# Patient Record
Sex: Male | Born: 1955 | Race: White | Hispanic: No | Marital: Married | Smoking: Current every day smoker
Health system: Southern US, Community
[De-identification: ages and names within clinical notes are randomized; demographics above are authoritative.]

## PROBLEM LIST (undated history)

## (undated) DIAGNOSIS — D696 Thrombocytopenia, unspecified: Secondary | ICD-10-CM

## (undated) DIAGNOSIS — K7460 Unspecified cirrhosis of liver: Secondary | ICD-10-CM

## (undated) DIAGNOSIS — Z9289 Personal history of other medical treatment: Secondary | ICD-10-CM

## (undated) DIAGNOSIS — D689 Coagulation defect, unspecified: Secondary | ICD-10-CM

## (undated) DIAGNOSIS — M1990 Unspecified osteoarthritis, unspecified site: Secondary | ICD-10-CM

## (undated) DIAGNOSIS — F419 Anxiety disorder, unspecified: Secondary | ICD-10-CM

## (undated) DIAGNOSIS — IMO0002 Reserved for concepts with insufficient information to code with codable children: Secondary | ICD-10-CM

## (undated) DIAGNOSIS — E512 Wernicke's encephalopathy: Secondary | ICD-10-CM

## (undated) DIAGNOSIS — D649 Anemia, unspecified: Secondary | ICD-10-CM

## (undated) DIAGNOSIS — K219 Gastro-esophageal reflux disease without esophagitis: Secondary | ICD-10-CM

## (undated) DIAGNOSIS — K859 Acute pancreatitis without necrosis or infection, unspecified: Secondary | ICD-10-CM

## (undated) DIAGNOSIS — E119 Type 2 diabetes mellitus without complications: Secondary | ICD-10-CM

## (undated) DIAGNOSIS — F1910 Other psychoactive substance abuse, uncomplicated: Secondary | ICD-10-CM

## (undated) DIAGNOSIS — F1011 Alcohol abuse, in remission: Secondary | ICD-10-CM

## (undated) HISTORY — PX: COLONOSCOPY: SHX174

## (undated) HISTORY — DX: Unspecified cirrhosis of liver: K74.60

## (undated) HISTORY — DX: Wernicke's encephalopathy: E51.2

## (undated) HISTORY — PX: NECK SURGERY: SHX720

## (undated) HISTORY — PX: ESOPHAGOGASTRODUODENOSCOPY: SHX1529

---

## 2003-12-17 ENCOUNTER — Ambulatory Visit (HOSPITAL_COMMUNITY): Admission: RE | Admit: 2003-12-17 | Discharge: 2003-12-17 | Payer: Self-pay | Admitting: Family Medicine

## 2005-12-16 ENCOUNTER — Ambulatory Visit (HOSPITAL_COMMUNITY): Admission: RE | Admit: 2005-12-16 | Discharge: 2005-12-16 | Payer: Self-pay | Admitting: Neurosurgery

## 2007-05-30 ENCOUNTER — Ambulatory Visit: Payer: Self-pay | Admitting: Gastroenterology

## 2007-05-31 ENCOUNTER — Ambulatory Visit (HOSPITAL_COMMUNITY): Admission: RE | Admit: 2007-05-31 | Discharge: 2007-05-31 | Payer: Self-pay | Admitting: Gastroenterology

## 2007-06-01 ENCOUNTER — Ambulatory Visit (HOSPITAL_COMMUNITY): Admission: RE | Admit: 2007-06-01 | Discharge: 2007-06-01 | Payer: Self-pay | Admitting: Internal Medicine

## 2007-06-09 ENCOUNTER — Inpatient Hospital Stay (HOSPITAL_COMMUNITY): Admission: EM | Admit: 2007-06-09 | Discharge: 2007-06-10 | Payer: Self-pay | Admitting: Emergency Medicine

## 2007-06-14 ENCOUNTER — Ambulatory Visit: Payer: Self-pay | Admitting: Gastroenterology

## 2007-06-14 ENCOUNTER — Emergency Department (HOSPITAL_COMMUNITY): Admission: EM | Admit: 2007-06-14 | Discharge: 2007-06-14 | Payer: Self-pay | Admitting: Emergency Medicine

## 2007-06-15 ENCOUNTER — Ambulatory Visit: Payer: Self-pay | Admitting: Gastroenterology

## 2007-06-15 ENCOUNTER — Encounter: Payer: Self-pay | Admitting: Gastroenterology

## 2007-06-15 ENCOUNTER — Ambulatory Visit (HOSPITAL_COMMUNITY): Admission: RE | Admit: 2007-06-15 | Discharge: 2007-06-15 | Payer: Self-pay | Admitting: Gastroenterology

## 2007-06-20 ENCOUNTER — Ambulatory Visit (HOSPITAL_COMMUNITY): Admission: RE | Admit: 2007-06-20 | Discharge: 2007-06-20 | Payer: Self-pay | Admitting: Gastroenterology

## 2007-06-21 ENCOUNTER — Ambulatory Visit: Payer: Self-pay | Admitting: Gastroenterology

## 2007-07-31 ENCOUNTER — Ambulatory Visit: Payer: Self-pay | Admitting: Gastroenterology

## 2007-12-03 ENCOUNTER — Emergency Department (HOSPITAL_COMMUNITY): Admission: EM | Admit: 2007-12-03 | Discharge: 2007-12-03 | Payer: Self-pay | Admitting: Emergency Medicine

## 2008-04-12 DIAGNOSIS — D539 Nutritional anemia, unspecified: Secondary | ICD-10-CM | POA: Insufficient documentation

## 2008-04-12 DIAGNOSIS — R042 Hemoptysis: Secondary | ICD-10-CM | POA: Insufficient documentation

## 2008-04-12 DIAGNOSIS — R112 Nausea with vomiting, unspecified: Secondary | ICD-10-CM

## 2008-04-12 DIAGNOSIS — Z8719 Personal history of other diseases of the digestive system: Secondary | ICD-10-CM

## 2008-04-12 DIAGNOSIS — R5381 Other malaise: Secondary | ICD-10-CM

## 2008-04-12 DIAGNOSIS — D649 Anemia, unspecified: Secondary | ICD-10-CM

## 2008-04-12 DIAGNOSIS — R1013 Epigastric pain: Secondary | ICD-10-CM

## 2008-04-12 DIAGNOSIS — R5383 Other fatigue: Secondary | ICD-10-CM

## 2008-04-12 DIAGNOSIS — K921 Melena: Secondary | ICD-10-CM

## 2008-04-12 DIAGNOSIS — F1010 Alcohol abuse, uncomplicated: Secondary | ICD-10-CM

## 2008-04-12 DIAGNOSIS — K219 Gastro-esophageal reflux disease without esophagitis: Secondary | ICD-10-CM

## 2008-04-12 DIAGNOSIS — K859 Acute pancreatitis without necrosis or infection, unspecified: Secondary | ICD-10-CM

## 2010-07-28 NOTE — Consult Note (Signed)
Jared, Glover             ACCOUNT NO.:  [id number]   MEDICAL RECORD NO.:  [id number]          PATIENT TYPE:  AMB   LOCATION:  DAY                           FACILITY:  [facility]   PHYSICIAN:  Kassie Mends, M.D.      DATE OF BIRTH:  1955/04/28   DATE OF CONSULTATION:  05/30/2007  DATE OF DISCHARGE:                                 CONSULTATION   CHIEF COMPLAINT:  Epigastric pain for 1 month.   HISTORY OF PRESENT ILLNESS:  [name] is a 55 year old male.  He  has a 56-month history of epigastric pressure.  He notes it is hard to  belch.  He describes the epigastrium being tender to the touch.  He  complains of symptoms all day long.  He denies any radiation of his pain  to shoulders or to his back.  He has tried Gas-X and Tums which may help  a little but not completely.  Complains of nausea.  Has had some  vomiting.  He has episodes where he feels shaky and dizzy.  He has been  seen by Dr. Raquel James, his primary care physician.  He was found to have  a mild anemia with hemoglobin of 11.8 and hematocrit 33.9, normal MCV.  He has also found to have transaminitis with an alkaline phosphatase of  213, AST 207 and ALT 107.  He denies any known history of hepatitis  denies any chest pain, dysphagia, odynophagia.  He does have  regurgitation within a few minutes after eating his meals.  He does  complain of anorexia and has lost about 5 pounds in the last month or  so.  He has noticed bright red blood as well as dark blood in his bowel  movements as well as on the toilet paper in the commode on several  occasions over the last month.  He is having anywhere from 1 to 5 semi-  formed bowel movements a day.  He has also noted clots in his stool.   He has had a CMP which showed a calcium of 8.3 and albumin of 3.3 and  LFTs as noted above.  He did have a normal total bilirubin, normal  sodium and potassium as well as creatinine, CBC showed white blood cell  count of 4.  He had hepatitis  markers drawn, but they were unable to be  analyzed.  Therefore these are going to need to be repeated.   PAST MEDICAL AND SURGICAL HISTORY:  He has had cervical disk surgery.   CURRENT MEDICATIONS:  Gas X p.r.n., Tums p.r.n.   ALLERGIES:  ASPIRIN causes upset stomach.   FAMILY HISTORY:  There is no known family history of carcinoma or  chronic GI problems.  Mother deceased 73 lung cancer.  Father deceased  from 77 to MI.  He has four healthy siblings.   SOCIAL HISTORY:  [name] is married.  He has one healthy daughter  one son and one daughter.  He is employed in [employer] previously but  was laid off about 2 months ago.  He has a 45 plus pack year history of  tobacco use.  He consumes about a 12-pack of beer a day and has for the  last 30 years he denies any drug use.   REVIEW OF SYSTEMS:  See HPI otherwise negative.   PHYSICAL:  VITAL SIGNS:  Weight 178 pounds, height 67 inches.  Temperature 98.5, blood pressure 104/70 and pulse 80.  GENERAL:  He is a well-developed well-nourished Caucasian male in no  acute distress.  HEENT:  Pupils equal, round, and reactive to light.  Sclerae clear, nonicteric.  Conjunctivae pink.  Oropharynx pink and  moist without lesions.NECK:  Supple without evidence of mass or  thyromegaly.  CHEST:  Heart regular rate and rhythm.  Normal S1, S2  without murmurs, clicks, rubs, or gallops.  LUNGS:  Clear to  auscultation bilaterally.  ABDOMEN:  Protuberant with positive bowel  sounds x4.  No bruits auscultated.  Epigastrium is quite tender to the  touch.  He is tender to the right upper quadrant as well.  He does have  firm tender hepatomegaly.  Liver is palpable 4 finger-breadths below the  right costal margin.  Unable to palpate splenomegaly.  Exam is limited  given the patient's body habitus.  EXTREMITIES:  Without clubbing or edema bilaterally.  SKIN:  Pink warm  and dry without any rash or jaundice.   LABORATORY STUDIES:  See HPI.    IMPRESSION:  [name] is a 55 year old Caucasian male with history  of significant alcohol abuse who presents with multiple concerns today  with the first being and epigastric right upper quadrant pressure that  has been persistent over the last month along with some weight loss and  nausea, heartburn and indigestion, and anorexia, and regurgitation.  Some of his symptoms are typical GERD symptoms.  However, they have  presented all of a sudden. He also has significant transaminitis which  could very well be related to alcohol abuse and cholestasis.   Of concern as well as is his large volume rectal bleeding which is going  to need to be further evaluated to rule out diverticular bleeding,  colopathy, or colorectal carcinoma.  Of course, he is going to need  ultrasound to look for cirrhosis as well given his thrombocytopenia and  history of alcohol abuse.   PLAN:  1. He is to abstain from alcohol.  However, he admits that he is not      ready to do this at this time.  He denies a prescription for      Librium for control.  2. He is going to need HCV antibody and hepatitis B surface antigen.  3. He is going to need an abdominal ultrasound this is possible.  4. He is going to need a colonoscopy and EGD with Dr. Cira Servant in the OR      with the use of propanol given his history of alcohol abuse      history.  I discussed this with her and both agree with this plan.      I have discussed risks and benefits, which include infection,      perforation, drug reaction.  Consent will be obtained.  5. He is to begin omeprazole 20 mg daily #31 with one refill.  Thank      you, Dr. Raquel James, for allowing [place] to participate in the care of      Mr. Eckersley.      Lorenza Burton, N.P.      Kassie Mends, M.D.  Electronically Signed    KJ/MEDQ  D:  05/30/2007  T:  05/30/2007  Job:  [id number]   cc:   Ursula Beath, MD  Fax: [phone]

## 2010-07-28 NOTE — Assessment & Plan Note (Signed)
NAMECARVEL, HUSKINS                CHART#:  [id number]   DATE:  06/14/2007                       DOB:  [birth year]       Lorenza Burton, N.P.  Electronically Signed     KJ/MEDQ  D:  06/14/2007  T:  06/14/2007  Job:  [id number]

## 2010-07-28 NOTE — Op Note (Signed)
NAMEGIANFRANCO, [name]             ACCOUNT NO.:  [id number]   MEDICAL RECORD NO.:  [id number]          PATIENT TYPE:  END   LOCATION:  DAY                           FACILITY:  [facility]   PHYSICIAN:  Kassie Mends, M.D.      DATE OF BIRTH:  04-10-1955   DATE OF PROCEDURE:  06/15/2007  DATE OF DISCHARGE:                               OPERATIVE REPORT   REFERRING PHYSICIAN:  Ursula Beath, MD.   PROCEDURE:  1. Colonoscopy with cold forceps polypectomy.  2. Esophagogastroduodenoscopy with cold forceps biopsy.   INDICATION FOR EXAM:  [name] is a 55 year old male who presented  as an outpatient with abdominal pain.  He was found to have pancreatitis  secondary to alcohol abuse.  He presented to the emergency department on  Friday with black tarry stools.  His hemoglobin was 7.5 and he was  transfused 4 units of packed red blood cells and sent home.  He has not  had any subsequent melena.  His hemoglobin has been stable.  He  continues to complain of abdominal pain.  His last measured lipase was  mildly elevated at 97 on June 14, 2007.  He is maintained as an  outpatient on Ultram.   FINDINGS:  1. Normal distal terminal ileum approximately 5 cm visualized.  No old      blood seen in the terminal ileum, or fresh blood.  2. No old or fresh blood seen in the colon.  A 3-mm sessile cecal      polyp removed via cold forceps.  A 5-mm sessile sigmoid colon polyp      removed via cold forceps.  Pancolonic diverticulosis.  Otherwise no      masses, inflammatory changes or AVMs seen.  3. Normal retroflexed view of the rectum.  4. Normal esophagus without evidence of Barrett's, mass, erosion,      ulceration or stricture.  No varices.  5. Snake skinning seen in the proximal stomach which extended into the      antrum.  Prominent proximal gastric folds.  No erosion or      ulceration.  No gastric varices.  Biopsies obtained via cold      forceps to evaluate for H. pylori gastritis.  6.  Frequent erythema, patchy erythema and the duodenal bulb extending      into the junction of D1 and D2.  No ulcerations.  No varices.      Normal second portion of the duodenum.  Copious amounts of bile      seen in the lumen.  The ampulla was not visualized.   DIAGNOSIS:  1. No source for anemia and melena identified.  2. Abdominal pain is likely multifactorial to include resolving      alcoholic pancreatitis and gastritis.   RECOMMENDATIONS:  1. Continue Ultram as needed for pain.  2. Avoid alcohol.  Avoid gastric irritants.  He was given a handout on      gastric irritants and gastritis.  3. He should have a capsule endoscopy to complete evaluation of his      gastrointestinal tract due to his history of  melena and hemoglobin      of 7.5.  4. He should continue his omeprazole.  He should avoid aspirin and      nonsteroidal anti-inflammatory drugs for 30 days.  No      anticoagulation for 5 days.  5. Will call him with the results of his biopsies of his colon.  If      his polyp is not an advanced adenoma, then he may have a screening      colonoscopy in 10 years, according to the 2008 ACG guidelines.  6. He should follow a high-fiber diet.  He was given a handout on high-      fiber diet, polyps and diverticulosis.   MEDICATIONS:  Propofol provided by anesthesia.   PROCEDURE TECHNIQUE:  Physical exam was performed.  Informed consent was  obtained from the patient that explained the benefits, risks and  alternatives to the procedure.  The patient was connected to the monitor  and placed in the left lateral position.  Continuous oxygen was provided  by nasal cannula and IV medicine administered through an indwelling  cannula.  After administration of sedation and rectal exam of the  patient's rectum was made and the scope was advanced under direct  visualization to the distal terminal ileum.  The scope was removed  slowly by carefully examining the color, texture, anatomy and  integrity  of the mucosa on the way out.   After the colonoscopy, the patient's esophagus was intubated with the  diagnostic gastroscope and the scope was advanced under direct  visualization to the second portion of duodenum.  The scope was removed  slowly by carefully examining the color, texture, anatomy and integrity  of the mucosa on the way out.  The patient was recovered in endoscopy  and discharged home in satisfactory condition.      Kassie Mends, M.D.  Electronically Signed     SM/MEDQ  D:  06/15/2007  T:  06/15/2007  Job:  [id number]   cc:   Ursula Beath, MD  Fax: [phone]

## 2010-07-28 NOTE — H&P (Signed)
[name], [name]             ACCOUNT NO.:  [id number]   MEDICAL RECORD NO.:  [id number]          PATIENT TYPE:  INP   LOCATION:  A304                          FACILITY:  [facility]   PHYSICIAN:  Gardiner Barefoot, MD    DATE OF BIRTH:  11-16-1955   DATE OF ADMISSION:  06/09/2007  DATE OF DISCHARGE:  LH                              HISTORY & PHYSICAL   PRIMARY CARE PHYSICIAN:  Dr. Raquel James.   CHIEF COMPLAINT:  Fatigue.   HISTORY OF PRESENT ILLNESS:  This is a 55 year old male with a history  of alcoholism with daily alcohol greater than 30 years, with last drink  March 17, when he reports he quit, and recent history of melena and  evaluation by gastroenterology, Dr. Cira Servant, for the melena here with  symptoms of fatigue for the last week or so.  The patient was actually  scheduled to have an EGD and colonoscopy this past week; however, due to  ongoing pancreatitis the colonoscopy was postponed.  The patient has had  a recent ultrasound, which shows steatosis likely secondary to his  alcohol and has had recent bouts of pancreatitis, also will likely  secondary to his alcohol use.  The patient, however, has completed a  course of Librium and quit drinking without any difficulty.  He presents  here again with some confusion and fatigue and just not feeling well  overall.  He denies any hematemesis or any vomiting and no blood from  below.  He does report the last melanotic stool was somewhere more than  4 days ago, although he is not sure exactly.  He, though, says that it  is very infrequent.  Otherwise, the patient has no other complaints.   PAST MEDICAL HISTORY:  1. Hepatosteatosis.  2. Alcohol abuse.   MEDICATIONS:  1. He recently finished a course of Librium for withdrawal.  2. Omeprazole.  3. Ultram.   ALLERGIES:  No known drug allergies.   SOCIAL HISTORY:  He quit tobacco or year ago, quit alcohol approximately  10 days ago.   FAMILY HISTORY:  No known history of any GI  disease.   REVIEW OF SYSTEMS:  Negative except as per history of present illness.   PHYSICAL EXAM:  VITALS:  Temperature is 99.4, pulse 102, respirations  20, blood pressure is 104/55, O2 saturation is 97% on room air.  GENERAL:  The patient is awake, alert and oriented x3, appears in no  acute distress.  No confusion at this time.  HEENT:  Anicteric.  CARDIOVASCULAR:  Tachycardic with a regular rhythm, with no murmurs,  rubs or gallops.  LUNGS:  Clear to auscultation bilaterally.  ABDOMEN:  Soft, nontender, nondistended.  Positive bowel sounds, no  hepatosplenomegaly, obese.  RECTAL:  No active bleeding.  No lesions.  No hemorrhoids exterior.  The  emergency room reports that the patient is heme-negative.  EXTREMITIES:  No cyanosis, clubbing or edema.   Hemoglobin is 7.0, platelets 292, WBC of 8.8.  Sodium 133, potassium  3.6, chloride 95, bicarb 31, BUN 5, creatinine is 1.06 and glucose 122.   ASSESSMENT/PLAN:  Multifactorial anemia.  Likely this is a combination  due to a mild gastrointestinal bleed, for which he is being evaluated by  gastroenterology and will benefit from a colonoscopy as an outpatient.  Also with an elevated MCV, he likely has some element to vitamin  deficiency, particular light of also his low albumin.  This also could  represent cirrhosis although the platelets are normal.  Additionally,  with the normal platelets it does not appear that the bleed is very  active at this time.  We will admit the patient for observation, give  him 4 units of packed red blood cells and the patient will be discharged  after the completion of the blood.  However, the patient was instructed  certainly to seek any medical attention if he does again start to bleed  from below  and to come to the nearest emergency room or call 9-1-1,  though at this time nothing is active and there is no indication for any  emergent intervention.  The patient's last hemoglobin in the medical   record here was greater than a year and a half ago, so it would not be  difficult to correlate with it now.  The patient and the patient's wife  were both comfortable with the plan and if there is any active bleeding,  the patient could certainly be transferred to a higher level of care and  have gastroenterology involved.      Gardiner Barefoot, MD  Electronically Signed     RWC/MEDQ  D:  06/09/2007  T:  06/10/2007  Job:  [id number]

## 2010-07-28 NOTE — H&P (Signed)
[name], [name]             ACCOUNT NO.:  [id number]   MEDICAL RECORD NO.:  [id number]          PATIENT TYPE:  AMB   LOCATION:  DAY                           FACILITY:  [facility]   PHYSICIAN:  Kassie Mends, M.D.      DATE OF BIRTH:  1956-01-25   DATE OF ADMISSION:  DATE OF DISCHARGE:  LH                              HISTORY & PHYSICAL   HISTORY OF PRESENT ILLNESS:  [name] is a 55 year old male with a  history of alcohol abuse, who quit drinking May 30, 2007, after an  office visit with [place].  He had significant epigastric pain at that time.  He also had nausea and vomiting.  He was having hematochezia.  He  noticed bright red blood in his stool at that time.  He was scheduled  for a colonoscopy and EGD under propofol in the OR with Dr. Cira Servant;  however, he called back in with severe epigastric pain.  He was sent  immediately to the hospital for a CT scan on June 01, 2007.  He was  found to have acute pancreatitis.  He was started on Librium for alcohol  withdrawal at that time and given Ultram for pain control.  He was  conservatively managed outpatient until June 09, 2007, when he  developed profound fatigue and weakness and confusion.  He presented to  the emergency room.  He was found to have a significant anemia.  His  hemoglobin had actually dropped from 11.8 down to 7.  He was given a 4-  unit transfusion and was felt to be stable and was sent home.  Due to  the fact that there was no gastroenterology coverage, he was told to  follow up with [place] this week.  He did have a CBC yesterday, which showed  a hemoglobin of 11.7, hematocrit of 33.8, a platelet count of 306 and a  white blood cell count of 7.6.  He presents today for follow-up prior to  his EGD and colonoscopy under Propofol tomorrow.  He tells me that he  has had continued weakness and fatigue and has been having some  coughing.  He complains of pain throughout his entire abdomen.  He has  had coughing and  gagging and actually blood in his sputum.  He denies  any further alcohol use since May 30, 2007.  He denies any nausea or  vomiting.  He does note that his pain radiates around to his left flank.  His abdominal pain is 8 or 9/10 on pain scale.  It is definitely worse  with inspiration.  He denies any significant shortness of breath.  He  has not had a bowel movement nor has he had any further melena since  hospitalization June 09, 2007.   CURRENT MEDICATIONS:  Omeprazole 20 mg daily.   ALLERGIES:  ASPIRIN causes upset stomach.   OBJECTIVE:  VITAL SIGNS:  Weight 174 pounds, height 67 inches,  temperature 98.5, blood pressure 90/68, pulse 64.  GENERAL:  [name] is an ill-appearing Caucasian male who is alert,  oriented, pleasant and cooperative.  He does appear quite  fatigued.  He  is accompanied by his wife.  HEENT:  Sclerae clear, nonicteric.  Conjunctivae pale.  Oropharynx pink  and moist without any lesions.  NECK:  Supple without any mass or thyromegaly.  CHEST:  Heart regular rate and rhythm without any murmurs, clicks, rubs  or gallops.  LUNGS:  With decreased breath sounds bilaterally.  ABDOMEN:  Protuberant with positive bowel sounds x4.  No bruits  auscultated.  Abdomen is soft, mildly tender to the epigastrium and  right upper quadrant and right flank.  The liver is just below the right  costal margin.  EXTREMITIES:  With trace lower extremity edema bilaterally.   IMPRESSION:  [name] is a 55 year old male with a history of  alcohol abuse, recent alcoholic pancreatitis, recent gastrointestinal  bleed with melena which required a 4-unit transfusion, as well as  profound fatigue, weakness, low-grade fever, and now hemoptysis.  I have  discussed this case with Dr. Cira Servant as well as Dr. Elige Radon, the  hospitalist.  He is scheduled to undergo colonoscopy and EGD under  Propofol by Dr. Cira Servant tomorrow to further evaluate his hematochezia and  melena and rule out  diverticular bleeding, colorectal carcinoma, peptic  ulcer disease, esophageal varices.  INR was 1.0 on May 31, 2007, and  he currently has a normal platelet count but does have some  hypoalbuminemia.  We also need to rule out a concomitant pneumonia given  the low-grade fevers and now hemoptysis.  Perforated peptic ulcer  disease should remain in the differential as well given gastrointestinal  bleed, anemia and pancreatitis.   PLAN:  1. I have discussed this case with Dr. Dorris Singh, [facility] hospitalist, and she recommends immediate evaluation in      the emergency room with probable hospital admission.  2. Dr. Cira Servant and Tana Coast, P.A.-C., are aware and will further be      involved in his care to determine whether he is going to be able to      proceed with colonoscopy and EGD set up in the OR for tomorrow with      Dr. Kassie Mends.  3. He should remain on omeprazole daily.  4. Further recommendations to follow.   ADDENDUM:  Patient seen and evaluated in ED. Missed his preop  appointment. Labs unchanged and CXR clear. D/C to home. Pt will prep for  procedure. Called in Ultram for pain.      [name], N.P.      Kassie Mends, M.D.  Electronically Signed    KJ/MEDQ  D:  06/14/2007  T:  06/14/2007  Job:  [id number]   cc:   Ursula Beath, MD  Fax: [phone]

## 2010-07-28 NOTE — Assessment & Plan Note (Signed)
[name], [name]                CHART#:  [id number]   DATE:  07/31/2007                       DOB:  05-12-1955   REFERRING PHYSICIAN:  Ursula Beath, M.D., [facility].   PROBLEM LIST:  1. Alcohol abuse, last drink 2-3 weeks ago.  2. Melena with a gastrointestinal workup to include an      esophagogastroduodenoscopy, colonoscopy, and capsule endoscopy.   SUBJECTIVE:  [name] is a 55 year old male who presented with  alcoholic pancreatitis and hepatitis.  He reports that he has not  really had any alcohol to drink except for 3 beers approximately 2-3  weeks ago.  He reports doing okay.  He says the antidepressants that  Dr. Raquel James put him on have been doing very well.  He is not sure of  the name.   MEDICATIONS:  Antidepressant   OBJECTIVE:  VITAL SIGNS:  Weight 201 pounds (up 20 pounds since March of  2009), height 5 feet 7 inches, BMI 31.5 (obese), temperature 98, blood  pressure 124/80, pulse 84.  GENERAL:  He is in no apparent distress.  Alert and oriented x4.  LUNGS:  Clear to auscultation bilaterally.  CARDIOVASCULAR:  Regular rhythm. ABDOMEN:  Bowel sounds are present.  Soft, nontender, nondistended, slightly obese.   ASSESSMENT:  [name] is a 55 year old male who was last seen in  April 2009 for his upper endoscopy and colonoscopy.  At that time, he  was known to have a persistently elevated lipase.  A CT scan in March of  2009 showed fullness in the head of his pancreas.  He reports that Dr.  Raquel James checked his labs, and they are all normal now.  His CT scan was  never performed.  He has had alcohol to drink once since May 30, 2007,  which is likley contributing to his persistently elevated lipase.  Thank  you for allowing me to see [name] in consultation.  My  recommendations follow.   RECOMMENDATIONS:  1. Will obtain labs from Dr. Debarah Crape office.  If a lipase has not      been checked, he will need a repeat  lipase, and if it remains      elevated, he needs a repeat CT scan of the abdomen with IV      contrast, pancreatic protocol.  2. Screening colonoscopy in 2019.  3. He can use the omeprazole as needed for any intermittent abdominal      pain.  4. Again, I encouraged him to completely avoid alcohol. I discussed      the complication of      chronic pancreatitis, its symptoms and its management.  5. Return visit in 6 months.       Kassie Mends, M.D.  Electronically Signed     SM/MEDQ  D:  07/31/2007  T:  07/31/2007  Job:  [id number]   cc:   Ursula Beath, MD

## 2010-07-28 NOTE — Discharge Summary (Signed)
[name], [name]             ACCOUNT NO.:  [id number]   MEDICAL RECORD NO.:  [id number]          PATIENT TYPE:  INP   LOCATION:  A304                          FACILITY:  [facility]   PHYSICIAN:  Gardiner Barefoot, MD    DATE OF BIRTH:  1955-06-24   DATE OF ADMISSION:  06/09/2007  DATE OF DISCHARGE:  03/28/2009LH                               DISCHARGE SUMMARY   PRIMARY CARE PHYSICIAN:  Ursula Beath, MD   DISCHARGE DIAGNOSES:  1. Anemia, multifactorial.  2. Alcohol abuse, status post self-rehabilitation.  3. Pancreatitis.   HISTORY OF PRESENT ILLNESS:  Please see dictated history and physical  from the day prior.  This is a 55 year old male who did come in with a  complaint of some confusion for about 10 days which correlated with when  he had stopped drinking alcohol on May 30, 2007.  He reported that he  just had been fatigued and did not feel his normal self.  The patient  was prescribed and took Librium to avoid any withdrawal symptoms and has  had no alcohol since May 30, 2007, at all.  He otherwise has felt  better, he initially had some pain and nausea, but now just did not felt  himself.  On evaluation, the patient was noted to be anemic with a  hemoglobin of 7.0.  Of note, the patient does report he is being  followed by Dr. Cira Servant and was initially to have colonoscopy and EGD;  however, was unable to do due to an acute exacerbation of pancreatitis.  This is being done because of intermittent melena.  The patient does  report last episode of melena was about 4 days prior to this  presentation and has had not any active bleeding since that time.   HOSPITAL COURSE:  1. Anemia.  The patient does have an elevated MCV and his platelets      were normal, this does suggest some nutritional etiology of his      anemia.  B12 and folate level have been sent off and the patient      was given a banana bag during his hospitalization.  He was also      transfused with 4 units  of packed red blood cells for symptomatic      anemia.  He is to have a followup CBC from his primary care      physician who he will contact on Monday.  He also is to contact Dr.      Cira Servant from gastroenterology to assure that he is getting followup      with gastroenterology.  The patient and wife were comfortable with      that plan.  Since there is no active bleed at this time and only      intermittent bouts of melena, there is no indication for any      intervention.  However, the patient was instructed strictly to      certainly return to the emergency room or seek medical care via 911      if he does have any episode of hematemesis, melena,  or bright red      blood per rectum, or any other concerns.  Again, the patient and      wife were comfortable with this plan.  There is a previous      hemoglobin which was closer to normal limits; however, it was about      a year and a half ago.  It does suggest more of a slow bleed;      however, it has been hard to correlate with that data.  However,      again with no active bleed.  There is no intervention appropriate      at this time.  2. Pancreatitis.  The patient did have a recent pancreatitis; however,      is not complaining of abdominal at this time, although he did have      a little nausea, this has resolved.  His laboratory values are      normal.  3. Alcohol abuse.  The patient has been off alcohol greater than 10      days and has essentially passed with the withdrawal.  No other      interventions at this time.  4. GERD.  The patient was continued on omeprazole and will continue      that at home.      Gardiner Barefoot, MD  Electronically Signed     RWC/MEDQ  D:  06/10/2007  T:  06/10/2007  Job:  [id number]

## 2010-07-31 NOTE — Op Note (Signed)
[name], WOODRICK             ACCOUNT NO.:  [id number]   MEDICAL RECORD NO.:  [id number]          PATIENT TYPE:  AMB   LOCATION:  DAY                           FACILITY:  [facility]   PHYSICIAN:  Kassie Mends, M.D.      DATE OF BIRTH:  07/24/1955   DATE OF PROCEDURE:  DATE OF DISCHARGE:  06/20/2007                                PROCEDURE NOTE   PROCEDURE:  Givens capsule study.   INDICATION FOR EXAM:  Mr. Staton is a 55 year old male who presented  to the emergency department with black tarry stools.  His hemoglobin was  found to be 7.  He received 4 units of packed red blood cells and his  hemoglobin increased to 11.1.  He had a colonoscopy and an  esophagogastroduodenoscopy in April 2009, which showed no source for  black tarry stools.  He had a cecal and a sigmoid colon polyp, which  were adenomatous and biopsies of his gastric mucosa showed chronic  gastritis without evidence of Helicobacter pylori.  The givens capsule  study is being performed due to obscure overt GI bleed.   PROCEDURE DATA:  Weight 178 pounds, build normal, gastric passage time,  1 minute.  Small bowel passage time, 5 hours and 20 minutes.  No old  blood or fresh blood was seen in the small intestines with occasional  views obscured by retained intestinal content.  No ulcers, masses, or  AVM's were seen.   RECOMMENDATIONS:  No source for melena identified.  He should continue  to avoid alcohol.  He should continue his omeprazole.  Return visit with  Dr. Cira Servant in 2 months.      Kassie Mends, M.D.  Electronically Signed     SM/MEDQ  D:  06/22/2007  T:  06/23/2007  Job:  [id number]   cc:   [facility] [place]  Fax: [phone]   Ursula Beath, MD  Fax: [phone]

## 2010-07-31 NOTE — Op Note (Signed)
[name], [name]             ACCOUNT NO.:  [id number]   MEDICAL RECORD NO.:  [id number]          PATIENT TYPE:  AMB   LOCATION:  SDS                          FACILITY:  [facility]   PHYSICIAN:  Reinaldo Meeker, M.D. DATE OF BIRTH:  Mar 25, 1955   DATE OF PROCEDURE:  12/16/2005  DATE OF DISCHARGE:                                 OPERATIVE REPORT   PREOPERATIVE DIAGNOSIS:  Herniated disk at C6-7.   POSTOPERATIVE DIAGNOSIS:  Herniated disk at C6-7.   PROCEDURE:  C6-7 anterior cervical diskectomy with bone bank fusion followed  by Venture anterior cervical plating.   SURGEON:  Dr. Gerlene Fee.   ASSISTANT:  Marikay Alar.   DESCRIPTION OF PROCEDURE:  After being placed in the supine position in 5  pounds halter traction, the patient's neck was prepped and draped in the  usual sterile fashion.  Localizing fluoroscopy was used prior to incision to  identify the appropriate level.  A transverse incision was made in the right  anterior neck, started at the midline headed towards the medial aspect of  the sternocleidomastoid muscle.  The platysma muscle was then incised  transversely.  The natural fascial plane between the strap muscles medially  and the sternocleidomastoid laterally was identified and followed down to  the anterior aspect of the cervical spine.  The longus colli muscles were  identified, split in the midline stripped away bilaterally with the Chief Operating Officer.  A self retaining retractor was placed for  exposure and x-rays showed approach to the appropriate level.  Using a 15  blade, the annulus of the disk was incised.  Using pituitary rongeurs and  curettes, approximately 90% of the disk material was removed. A high-speed  drill was used to widen the interspace. The microscope was draped, brought  in the field and used the remainder of the case.  Using microsection  technique, the remainder of the disk material along the posterior  longitudinal ligament  was removed. The ligament was then incised  transversely and the cut edges removed with a Kerrison punch.  Herniated  disk material was removed bilaterally particularly towards the right  symptomatic side and the C7 nerve roots were well decompressed bilaterally.  At this time, inspection was carried out in all directions for any evidence  of residual compression and none could be identified.  Large amounts of  irrigation were carried out and any bleeding was controlled with bipolar  coagulation and Gelfoam.  Measurements were taken and an 8 mm bone bank plug  was reconstituted.  After irrigating once more to confirm hemostasis, a plug  was impacted without difficulty and fluoroscopy showed it to be in good  position.  An appropriate length transverse cervical plate was then chosen.  Under fluoroscopic guidance, drill holes were placed followed by placement  of 13 mm screws x4.  The locking mechanism was secured at all 4 locations  and final fluoroscopy showed the plate, screws and plug to all be in good  position.  At this time, large amounts of irrigation were carried out and  any bleeding controlled with bipolar  coagulation.  The wound was then closed using interrupted Vicryl on the  platysmas muscle and inverted 5-0 PDS on the subcuticular layer and Steri-  Strips on the skin.  A sterile dressing and soft collar then applied and the  patient was extubated, taken to the recovery room in stable condition.           ______________________________  Reinaldo Meeker, M.D.     ROK/MEDQ  D:  12/16/2005  T:  12/17/2005  Job:  [id number]

## 2010-12-07 LAB — CBC
HCT: 20.4 — ABNORMAL LOW
Hemoglobin: 7 — CL
MCHC: 34.1
MCV: 102.6 — ABNORMAL HIGH
Platelets: 292
RBC: 1.99 — ABNORMAL LOW
RDW: 17.6 — ABNORMAL HIGH
WBC: 8.8

## 2010-12-07 LAB — COMPREHENSIVE METABOLIC PANEL
ALT: 24
AST: 41 — ABNORMAL HIGH
Albumin: 2 — ABNORMAL LOW
Alkaline Phosphatase: 74
BUN: 5 — ABNORMAL LOW
CO2: 31
Calcium: 8.5
Chloride: 95 — ABNORMAL LOW
Creatinine, Ser: 1.06
GFR calc Af Amer: >60
GFR calc non Af Amer: >60
Glucose, Bld: 122 — ABNORMAL HIGH
Potassium: 3.6
Sodium: 133 — ABNORMAL LOW
Total Bilirubin: 0.8
Total Protein: 5.6 — ABNORMAL LOW

## 2010-12-07 LAB — CROSSMATCH
ABO/RH(D): A POS
Antibody Screen: NEGATIVE

## 2010-12-07 LAB — DIFFERENTIAL
Basophils Absolute: 0
Basophils Relative: 0
Eosinophils Absolute: 0.1
Eosinophils Relative: 1
Lymphocytes Relative: 11 — ABNORMAL LOW
Lymphs Abs: 0.9
Monocytes Absolute: 0.6
Monocytes Relative: 7
Neutro Abs: 7.1
Neutrophils Relative %: 81 — ABNORMAL HIGH

## 2010-12-07 LAB — FOLATE: Folate: 11.1

## 2010-12-07 LAB — AMMONIA: Ammonia: 8 — ABNORMAL LOW

## 2010-12-07 LAB — LIPASE, BLOOD: Lipase: 111 — ABNORMAL HIGH

## 2010-12-07 LAB — AMYLASE: Amylase: 120

## 2010-12-07 LAB — PREPARE RBC (CROSSMATCH)

## 2010-12-07 LAB — ETHANOL: Alcohol, Ethyl (B): <5

## 2010-12-07 LAB — ABO/RH: ABO/RH(D): A POS

## 2010-12-07 LAB — VITAMIN B12: Vitamin B-12: 944 — ABNORMAL HIGH (ref 211–911)

## 2010-12-08 LAB — COMPREHENSIVE METABOLIC PANEL
ALT: 16
AST: 29
Albumin: 2.2 — ABNORMAL LOW
Alkaline Phosphatase: 73
BUN: 9
CO2: 31
Calcium: 8.9
Chloride: 101
Creatinine, Ser: 0.92
GFR calc Af Amer: >60
GFR calc non Af Amer: >60
Glucose, Bld: 112 — ABNORMAL HIGH
Potassium: 4.6
Sodium: 138
Total Bilirubin: 0.9
Total Protein: 6.1

## 2010-12-08 LAB — LIPID PANEL
Cholesterol: 149
HDL: 17 — ABNORMAL LOW
Triglycerides: 170 — ABNORMAL HIGH

## 2010-12-08 LAB — AMYLASE: Amylase: 74

## 2010-12-08 LAB — CBC
HCT: 31.8 — ABNORMAL LOW
Hemoglobin: 11.1 — ABNORMAL LOW
MCHC: 34.9
MCV: 94.6
Platelets: 302
RBC: 3.37 — ABNORMAL LOW
RDW: 19.2 — ABNORMAL HIGH
WBC: 6.1

## 2010-12-08 LAB — LIPASE, BLOOD: Lipase: 97 — ABNORMAL HIGH

## 2010-12-08 LAB — DIFFERENTIAL
Basophils Absolute: 0
Basophils Relative: 1
Eosinophils Absolute: 0.1
Eosinophils Relative: 2
Lymphocytes Relative: 20
Lymphs Abs: 1.2
Monocytes Absolute: 0.3
Monocytes Relative: 6
Neutro Abs: 4.4
Neutrophils Relative %: 73

## 2010-12-08 LAB — AMMONIA: Ammonia: 11

## 2010-12-14 LAB — URINALYSIS, ROUTINE W REFLEX MICROSCOPIC
Bilirubin Urine: NEGATIVE
Glucose, UA: NEGATIVE
Hgb urine dipstick: NEGATIVE
Ketones, ur: NEGATIVE
Protein, ur: NEGATIVE

## 2010-12-14 LAB — LIPASE, BLOOD: Lipase: 157 — ABNORMAL HIGH

## 2010-12-14 LAB — COMPREHENSIVE METABOLIC PANEL
ALT: 28
AST: 35
Albumin: 3.6
Alkaline Phosphatase: 66
BUN: 8
CO2: 30
Calcium: 9.6
Chloride: 99
Creatinine, Ser: 0.77
GFR calc Af Amer: >60
GFR calc non Af Amer: >60
Glucose, Bld: 112 — ABNORMAL HIGH
Potassium: 3.9
Sodium: 135
Total Bilirubin: 0.9
Total Protein: 7.7

## 2010-12-14 LAB — CBC
HCT: 36 — ABNORMAL LOW
MCV: 98.3
Platelets: 150
RDW: 13.1

## 2012-03-31 ENCOUNTER — Encounter (HOSPITAL_COMMUNITY): Payer: Self-pay | Admitting: Emergency Medicine

## 2012-03-31 ENCOUNTER — Emergency Department (HOSPITAL_COMMUNITY): Payer: 59

## 2012-03-31 ENCOUNTER — Observation Stay (HOSPITAL_COMMUNITY)
Admission: EM | Admit: 2012-03-31 | Discharge: 2012-04-01 | Disposition: A | Discharge status: Home / Self Care | Payer: 59 | Attending: Emergency Medicine | Admitting: Emergency Medicine

## 2012-03-31 DIAGNOSIS — R079 Chest pain, unspecified: Principal | ICD-10-CM | POA: Insufficient documentation

## 2012-03-31 DIAGNOSIS — E669 Obesity, unspecified: Secondary | ICD-10-CM | POA: Insufficient documentation

## 2012-03-31 LAB — URINALYSIS, ROUTINE W REFLEX MICROSCOPIC
Glucose, UA: NEGATIVE mg/dL
Hgb urine dipstick: NEGATIVE
Specific Gravity, Urine: 1.019 (ref 1.005–1.030)
pH: 5.5 (ref 5.0–8.0)

## 2012-03-31 LAB — COMPREHENSIVE METABOLIC PANEL
ALT: 122 U/L — ABNORMAL HIGH (ref 0–53)
AST: 336 U/L — ABNORMAL HIGH (ref 0–37)
Alkaline Phosphatase: 107 U/L (ref 39–117)
CO2: 26 mEq/L (ref 19–32)
Chloride: 94 mEq/L — ABNORMAL LOW (ref 96–112)
Creatinine, Ser: 0.65 mg/dL (ref 0.50–1.35)
GFR calc non Af Amer: >90 mL/min (ref >90)
Potassium: 3.9 mEq/L (ref 3.5–5.1)
Sodium: 133 mEq/L — ABNORMAL LOW (ref 135–145)
Total Bilirubin: 0.8 mg/dL (ref 0.3–1.2)

## 2012-03-31 LAB — CBC
MCV: 99.5 fL (ref 78.0–100.0)
Platelets: 85 10*3/uL — ABNORMAL LOW (ref 150–400)
RBC: 3.82 MIL/uL — ABNORMAL LOW (ref 4.22–5.81)
WBC: 2.5 10*3/uL — ABNORMAL LOW (ref 4.0–10.5)

## 2012-03-31 LAB — POCT I-STAT TROPONIN I
Troponin i, poc: 0 ng/mL (ref 0.00–0.08)
Troponin i, poc: 0.01 ng/mL (ref 0.00–0.08)

## 2012-03-31 MED ORDER — ASPIRIN 81 MG PO CHEW: 324.0000 mg | CHEWABLE_TABLET | Freq: Once | ORAL | Status: DC

## 2012-03-31 MED ORDER — SODIUM CHLORIDE 0.9 % IV SOLN
1000.0000 mL | INTRAVENOUS | Status: DC
  Administered 2012-03-31: 1000 mL via INTRAVENOUS

## 2012-03-31 MED ORDER — NITROGLYCERIN 0.4 MG SL SUBL: 0.4000 mg | SUBLINGUAL_TABLET | SUBLINGUAL | Status: DC | PRN

## 2012-03-31 MED ORDER — METOPROLOL TARTRATE 1 MG/ML IV SOLN
5.0000 mg | INTRAVENOUS | Status: AC | PRN
  Administered 2012-03-31 (×3): 5 mg via INTRAVENOUS
  Filled 2012-03-31 (×3): qty 5

## 2012-03-31 NOTE — ED Provider Notes (Signed)
History     CSN: [id number]  Arrival date & time 03/31/12  1504   First MD Initiated Contact with Patient 03/31/12 [phone]      Chief Complaint  Patient presents with  . Chest Pain    (Consider location/radiation/quality/duration/timing/severity/associated sxs/prior treatment) Patient is a 57 y.o. male presenting with chest pain. The history is provided by the patient and the EMS personnel. No language interpreter was used.  Chest Pain The chest pain began 3 - 5 hours ago (Patient had onset of pain in his left anterior chest around noon. It was a pulsating feeling, rated at a 5. It radiated through to his back.). Episode Length: The pain lasted continuously from its onset until his arrival in the ED. Chest pain occurs constantly. The chest pain is improving. Associated with: Nothing. At its most intense, the pain is at 8/10. The pain is currently at 5/10. Radiates to: A throbbing pain. Exacerbated by: Nothing. He tried nitroglycerin, aspirin and oxygen for the symptoms. Risk factors include male gender, obesity and lack of exercise. Past medical history comments: Cervical disc surgery for twice, gastroesophageal reflux disease.     No past medical history on file.  No past surgical history on file.  No family history on file.  History  Substance Use Topics  . Smoking status: Not on file  . Smokeless tobacco: Not on file  . Alcohol Use: Not on file      Review of Systems  Constitutional: Negative.   HENT: Negative.   Eyes: Negative.   Respiratory: Negative.   Cardiovascular: Positive for chest pain.  Gastrointestinal: Negative.   Genitourinary: Negative.   Musculoskeletal: Negative.   Skin: Negative.   Neurological: Negative.   Psychiatric/Behavioral: Negative.     Allergies  Aspirin  Home Medications  No current outpatient prescriptions on file.  BP 161/102  Pulse 101  Temp 97.3 F (36.3 C) (Oral)  Resp 22  SpO2 98%  Physical Exam  Nursing note and vitals  reviewed. Constitutional: He is oriented to person, place, and time. He appears well-developed and well-nourished. No distress.  HENT:  Head: Normocephalic and atraumatic.  Right Ear: External ear normal.  Left Ear: External ear normal.  Mouth/Throat: Oropharynx is clear and moist.  Eyes: Conjunctivae normal and EOM are normal. Pupils are equal, round, and reactive to light.  Neck: Normal range of motion. Neck supple.  Cardiovascular: Normal rate, regular rhythm and normal heart sounds.   Pulmonary/Chest: Effort normal and breath sounds normal.  Abdominal: Soft. Bowel sounds are normal.  Musculoskeletal: Normal range of motion. He exhibits no edema.  Neurological: He is alert and oriented to person, place, and time.       No sensory or motor deficit.  Skin: Skin is warm and dry.  Psychiatric: He has a normal mood and affect. His behavior is normal.    ED Course  Procedures (including critical care time)  3:19 PM  Date: 03/31/2012  Rate:94  Rhythm: normal sinus rhythm  QRS Axis: normal  Intervals: normal  ST/T Wave abnormalities: normal  Conduction Disutrbances:none  Narrative Interpretation: Normal EKG  Old EKG Reviewed: unchanged from tracing of 12/15/2005.   3:30 PM Pt seen --> physical exam performed.  EKG non-acute.  Lab workup ordered.  4:51 PM Lab workup is negative so far.  Initial TNI negative.  Chest x-ray and EKG negative.  CMET, UA pending.  I advised pt and his wife that even if his initial tests are negative I would advise overnight observation  and additional testing for coronary artery disease.  8:08 PM Results for orders placed during the hospital encounter of 03/31/12  CBC      Component Value Range   WBC 2.5 (*) 4.0 - 10.5 K/uL   RBC 3.82 (*) 4.22 - 5.81 MIL/uL   Hemoglobin 13.7  13.0 - 17.0 g/dL   HCT 16.1 (*) 09.6 - 04.5 %   MCV 99.5  78.0 - 100.0 fL   MCH 35.9 (*) 26.0 - 34.0 pg   MCHC 36.1 (*) 30.0 - 36.0 g/dL   RDW 40.9  81.1 - 91.4 %    Platelets 85 (*) 150 - 400 K/uL  COMPREHENSIVE METABOLIC PANEL      Component Value Range   Sodium 133 (*) 135 - 145 mEq/L   Potassium 3.9  3.5 - 5.1 mEq/L   Chloride 94 (*) 96 - 112 mEq/L   CO2 26  19 - 32 mEq/L   Glucose, Bld 105 (*) 70 - 99 mg/dL   BUN 13  6 - 23 mg/dL   Creatinine, Ser 7.82  0.50 - 1.35 mg/dL   Calcium 9.4  8.4 - 95.6 mg/dL   Total Protein 7.9  6.0 - 8.3 g/dL   Albumin 3.7  3.5 - 5.2 g/dL   AST 213 (*) 0 - 37 U/L   ALT 122 (*) 0 - 53 U/L   Alkaline Phosphatase 107  39 - 117 U/L   Total Bilirubin 0.8  0.3 - 1.2 mg/dL   GFR calc non Af Amer >90  >90 mL/min   GFR calc Af Amer >90  >90 mL/min  PROTIME-INR      Component Value Range   Prothrombin Time 13.4  11.6 - 15.2 seconds   INR 1.03  0.00 - 1.49  APTT      Component Value Range   aPTT 28  24 - 37 seconds  POCT I-STAT TROPONIN I      Component Value Range   Troponin i, poc 0.00  0.00 - 0.08 ng/mL   Comment 3           POCT I-STAT TROPONIN I      Component Value Range   Troponin i, poc 0.01  0.00 - 0.08 ng/mL   Comment 3            Dg Chest Portable 1 View  03/31/2012  *RADIOLOGY REPORT*  Clinical Data: Tachycardia  PORTABLE CHEST - 1 VIEW  Comparison: 06/14/2007  Findings: Cardiomediastinal silhouette is stable.  Metallic fixation plate cervical spine again noted.  No acute infiltrate or pleural effusion.  No pulmonary edema.  IMPRESSION: No active disease.  No significant change.   Original Report Authenticated By: Natasha Mead, M.D.     Second TNI was negative.  Pt remains asymptomatic.  Will move to Pod C for Chest Pain Protocol.   1. Chest pain             Carleene Cooper III, MD 03/31/12 2025

## 2012-03-31 NOTE — ED Notes (Signed)
Report taken from Adrian Prows, RN

## 2012-03-31 NOTE — ED Notes (Signed)
Per report from [facility] pt was at work and he began to have Mid to Left chest pain with radiation to his mid back.  He states that the chest pain began yesterday and was initially relieved by tums.  He is still having the chest pain and it was unrelieved by 2 SL NTG by ems.

## 2012-03-31 NOTE — ED Notes (Signed)
Portable xray at bedside. Unable to give Metoprolol at q68min interval at this time.

## 2012-03-31 NOTE — ED Notes (Signed)
MD at bedside. 

## 2012-03-31 NOTE — ED Notes (Signed)
Pt currently denies pain. Pt denies nausea/vomiting. Pt denies difficulty breathing. Pt denies numbness/tingling. Pt mentating appropriately. Pt does not appear to be in acute distress.

## 2012-03-31 NOTE — ED Notes (Signed)
Pt states last alcohol was last night, pt states drank about 5 beers. Pt states drank last one a little after 8pm last night.

## 2012-04-01 MED ORDER — METOPROLOL TARTRATE 25 MG PO TABS
100.0000 mg | ORAL_TABLET | Freq: Once | ORAL | Status: AC
  Administered 2012-04-01: 100 mg via ORAL
  Filled 2012-04-01: qty 4

## 2012-04-01 MED ORDER — METOPROLOL TARTRATE 1 MG/ML IV SOLN: 2.5000 mg | Freq: Once | INTRAVENOUS | Status: DC

## 2012-04-01 MED ORDER — LORAZEPAM 2 MG/ML IJ SOLN
1.0000 mg | Freq: Once | INTRAMUSCULAR | Status: AC
  Administered 2012-04-01: 1 mg via INTRAVENOUS
  Filled 2012-04-01: qty 1

## 2012-04-01 MED ORDER — ASPIRIN 81 MG PO CHEW: 81.0000 mg | CHEWABLE_TABLET | Freq: Every day | ORAL | Status: DC

## 2012-04-01 MED ORDER — METOPROLOL TARTRATE 1 MG/ML IV SOLN
2.5000 mg | Freq: Once | INTRAVENOUS | Status: AC
  Administered 2012-04-01: 2.5 mg via INTRAVENOUS
  Filled 2012-04-01: qty 5

## 2012-04-01 NOTE — ED Provider Notes (Signed)
12:23 PM ECG x 3 reassuring. No changes since 2007. Early repol patter. Cardiac enzymes x 3 negative. No CP since yesterday. Close outpatient cardiology follow up recommended. Understands to return to ER for new or worsening symptoms. Attempted Cardiac CT but unable to get HR low enough for test. No ability of stress testing today. I don't believe the pt needs admission to hospital. I don't believe he needs a cardiac cath at this time. Close cards follow up  Lyanne Co, MD 04/01/12 1225

## 2012-04-01 NOTE — ED Notes (Signed)
Explained cardiac ct to pt. Questions answered

## 2012-04-01 NOTE — ED Notes (Signed)
Pt states he has a history of frequent pancreatitis. States this pain is different. Denies pain or nausea at this time

## 2012-04-01 NOTE — ED Notes (Signed)
EKG given to Dr. Powers and copy placed in pt chart. 

## 2012-04-01 NOTE — ED Notes (Signed)
Spoke with dr Patria Mane about pt heart rate not responding well to metoprolol. Will try ativan then iv metoprolol. Marland Kitchen

## 2012-04-01 NOTE — ED Notes (Signed)
Spoke with dr Reche Dixon about pt poor rate control. He is going to call and discuss options with dr Patria Mane

## 2012-04-01 NOTE — ED Notes (Signed)
BMI 26.5

## 2012-07-28 ENCOUNTER — Other Ambulatory Visit: Payer: Self-pay | Admitting: Physician Assistant

## 2012-07-28 DIAGNOSIS — R109 Unspecified abdominal pain: Secondary | ICD-10-CM

## 2012-07-31 ENCOUNTER — Ambulatory Visit
Admission: RE | Admit: 2012-07-31 | Discharge: 2012-07-31 | Disposition: A | Discharge status: Home / Self Care | Payer: 59 | Source: Ambulatory Visit | Attending: Physician Assistant | Admitting: Physician Assistant

## 2012-07-31 DIAGNOSIS — R109 Unspecified abdominal pain: Secondary | ICD-10-CM

## 2012-07-31 MED ORDER — IOHEXOL 300 MG/ML  SOLN
100.0000 mL | Freq: Once | INTRAMUSCULAR | Status: AC | PRN
  Administered 2012-07-31: 100 mL via INTRAVENOUS

## 2012-08-01 ENCOUNTER — Other Ambulatory Visit: Payer: Self-pay | Admitting: Physician Assistant

## 2012-08-01 DIAGNOSIS — K859 Acute pancreatitis without necrosis or infection, unspecified: Secondary | ICD-10-CM

## 2012-08-06 ENCOUNTER — Ambulatory Visit
Admission: RE | Admit: 2012-08-06 | Discharge: 2012-08-06 | Disposition: A | Discharge status: Home / Self Care | Payer: 59 | Source: Ambulatory Visit | Attending: Physician Assistant | Admitting: Physician Assistant

## 2012-08-06 DIAGNOSIS — K859 Acute pancreatitis without necrosis or infection, unspecified: Secondary | ICD-10-CM

## 2012-08-06 MED ORDER — GADOBENATE DIMEGLUMINE 529 MG/ML IV SOLN
15.0000 mL | Freq: Once | INTRAVENOUS | Status: AC | PRN
  Administered 2012-08-06: 15 mL via INTRAVENOUS

## 2012-08-16 ENCOUNTER — Other Ambulatory Visit: Payer: Self-pay | Admitting: Gastroenterology

## 2012-08-16 ENCOUNTER — Encounter (HOSPITAL_COMMUNITY): Payer: Self-pay | Admitting: *Deleted

## 2012-08-16 ENCOUNTER — Encounter (HOSPITAL_COMMUNITY): Payer: Self-pay | Admitting: Pharmacy Technician

## 2012-08-16 DIAGNOSIS — F1011 Alcohol abuse, in remission: Secondary | ICD-10-CM

## 2012-08-16 DIAGNOSIS — IMO0002 Reserved for concepts with insufficient information to code with codable children: Secondary | ICD-10-CM

## 2012-08-16 DIAGNOSIS — K859 Acute pancreatitis without necrosis or infection, unspecified: Secondary | ICD-10-CM

## 2012-08-16 DIAGNOSIS — Z9289 Personal history of other medical treatment: Secondary | ICD-10-CM

## 2012-08-16 DIAGNOSIS — M1990 Unspecified osteoarthritis, unspecified site: Secondary | ICD-10-CM

## 2012-08-16 HISTORY — DX: Acute pancreatitis without necrosis or infection, unspecified: K85.90

## 2012-08-16 HISTORY — DX: Unspecified osteoarthritis, unspecified site: M19.90

## 2012-08-16 HISTORY — DX: Reserved for concepts with insufficient information to code with codable children: IMO0002

## 2012-08-16 HISTORY — DX: Alcohol abuse, in remission: F10.11

## 2012-08-16 HISTORY — DX: Personal history of other medical treatment: Z92.89

## 2012-08-18 ENCOUNTER — Ambulatory Visit (HOSPITAL_COMMUNITY): Payer: 59 | Admitting: Anesthesiology

## 2012-08-18 ENCOUNTER — Ambulatory Visit (HOSPITAL_COMMUNITY)
Admission: RE | Admit: 2012-08-18 | Discharge: 2012-08-18 | Disposition: A | Discharge status: Home / Self Care | Payer: 59 | Source: Ambulatory Visit | Attending: Gastroenterology | Admitting: Gastroenterology

## 2012-08-18 ENCOUNTER — Encounter (HOSPITAL_COMMUNITY)
Admission: RE | Disposition: A | Discharge status: Home / Self Care | Payer: Self-pay | Source: Ambulatory Visit | Attending: Gastroenterology

## 2012-08-18 ENCOUNTER — Encounter (HOSPITAL_COMMUNITY): Payer: Self-pay | Admitting: Anesthesiology

## 2012-08-18 ENCOUNTER — Encounter (HOSPITAL_COMMUNITY): Payer: Self-pay | Admitting: *Deleted

## 2012-08-18 DIAGNOSIS — R748 Abnormal levels of other serum enzymes: Secondary | ICD-10-CM | POA: Insufficient documentation

## 2012-08-18 DIAGNOSIS — K869 Disease of pancreas, unspecified: Secondary | ICD-10-CM | POA: Insufficient documentation

## 2012-08-18 DIAGNOSIS — K219 Gastro-esophageal reflux disease without esophagitis: Secondary | ICD-10-CM | POA: Insufficient documentation

## 2012-08-18 DIAGNOSIS — R933 Abnormal findings on diagnostic imaging of other parts of digestive tract: Secondary | ICD-10-CM | POA: Insufficient documentation

## 2012-08-18 DIAGNOSIS — K8689 Other specified diseases of pancreas: Secondary | ICD-10-CM | POA: Insufficient documentation

## 2012-08-18 HISTORY — DX: Anemia, unspecified: D64.9

## 2012-08-18 HISTORY — DX: Alcohol abuse, in remission: F10.11

## 2012-08-18 HISTORY — DX: Personal history of other medical treatment: Z92.89

## 2012-08-18 HISTORY — DX: Acute pancreatitis without necrosis or infection, unspecified: K85.90

## 2012-08-18 HISTORY — DX: Gastro-esophageal reflux disease without esophagitis: K21.9

## 2012-08-18 HISTORY — PX: EUS: SHX5427

## 2012-08-18 HISTORY — DX: Unspecified osteoarthritis, unspecified site: M19.90

## 2012-08-18 HISTORY — DX: Reserved for concepts with insufficient information to code with codable children: IMO0002

## 2012-08-18 SURGERY — UPPER ENDOSCOPIC ULTRASOUND (EUS) LINEAR
Anesthesia: Monitor Anesthesia Care

## 2012-08-18 MED ORDER — MIDAZOLAM HCL 5 MG/5ML IJ SOLN
INTRAMUSCULAR | Status: DC | PRN
  Administered 2012-08-18: 2 mg via INTRAVENOUS

## 2012-08-18 MED ORDER — SODIUM CHLORIDE 0.9 % IV SOLN: INTRAVENOUS | Status: DC

## 2012-08-18 MED ORDER — LABETALOL HCL 5 MG/ML IV SOLN
INTRAVENOUS | Status: DC | PRN
  Administered 2012-08-18 (×3): 5 mg via INTRAVENOUS

## 2012-08-18 MED ORDER — LACTATED RINGERS IV SOLN
INTRAVENOUS | Status: DC | PRN
  Administered 2012-08-18: 13:00:00 via INTRAVENOUS

## 2012-08-18 MED ORDER — PROPOFOL INFUSION 10 MG/ML OPTIME
INTRAVENOUS | Status: DC | PRN
  Administered 2012-08-18: 140 ug/kg/min via INTRAVENOUS

## 2012-08-18 MED ORDER — FENTANYL CITRATE 0.05 MG/ML IJ SOLN: 25.0000 ug | INTRAMUSCULAR | Status: DC | PRN

## 2012-08-18 MED ORDER — LACTATED RINGERS IV SOLN
INTRAVENOUS | Status: DC
  Administered 2012-08-18: 1000 mL via INTRAVENOUS

## 2012-08-18 MED ORDER — BUTAMBEN-TETRACAINE-BENZOCAINE 2-2-14 % EX AERO
INHALATION_SPRAY | CUTANEOUS | Status: DC | PRN
  Administered 2012-08-18: 2 via TOPICAL

## 2012-08-18 NOTE — H&P (Signed)
  [name] HPI: This is a 57 year male with findings of a 2.5 x 1.8 cm body/tail pancreatic mass.  This was identified on a CT scan.  In the past he had a CT scan and it suggested a pancreatic head enlargement, however, no overt masses were identified.  He has a history of ETOH abuse and he continues to drink 12 beers per day.  From time to time he will have "pancreatitis".  His lipase is elevated chronically, but it does not always correlate with his abdominal pain.  Additionally, his liver enzymes continue to be evaluated, but he is negative for HCV and HBV.  Past Medical History  Diagnosis Date  . GERD (gastroesophageal reflux disease)   . 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 Transfusion history 08-16-12    '99  . Pancreatitis 08-16-12    at present weakness,fatiques easily  . Foreign body 08-16-12    nose"BB" pellet since age 1  . History of ETOH abuse 08-16-12    Heavy alcohol use daily    Past Surgical History  Procedure Laterality Date  . Neck surgery      fusion of neck-titanium hardware inplaced    History reviewed. No pertinent family history.  Social History:  reports that he quit smoking about 7 years ago. His smoking use included Cigarettes. He smoked 0.00 packs per day. He does not have any smokeless tobacco history on file. He reports that he drinks about 4.8 ounces of alcohol per week. His drug history is not on file.  Allergies:  Allergies  Allergen Reactions  . Aspirin Other (See Comments)    Has upset stomach in high doses only. Took 4 aspirin with EMS, no reaction.    Medications: Scheduled: Continuous:  No results found for this or any previous visit (from the past 24 hour(s)).   No results found.  ROS:  As stated above in the HPI otherwise negative.  There were no vitals taken for this visit.    PE: Gen: NAD, Alert and Oriented HEENT:  [place]/AT, EOMI Neck: Supple, no LAD Lungs: CTA Bilaterally CV: RRR without M/G/R ABM: Soft, NTND,  +BS Ext: No C/C/E  Assessment/Plan: 1) Pancreatic body/tail mass.  Plan: 1) EUS with FNA.  [name] D 08/18/2012, 9:09 AM

## 2012-08-18 NOTE — Anesthesia Postprocedure Evaluation (Signed)
  Anesthesia Post-op Note  Patient: [name]  Procedure(s) Performed: Procedure(s) (LRB): UPPER ENDOSCOPIC ULTRASOUND (EUS) LINEAR (N/A)  Patient Location: PACU  Anesthesia Type: MAC  Level of Consciousness: awake and alert   Airway and Oxygen Therapy: Patient Spontanous Breathing  Post-op Pain: mild  Post-op Assessment: Post-op Vital signs reviewed, Patient's Cardiovascular Status Stable, Respiratory Function Stable, Patent Airway and No signs of Nausea or vomiting  Last Vitals:  Filed Vitals:   08/18/12 1445  BP: 157/112  Temp:   Resp: 17    Post-op Vital Signs: stable   Complications: No apparent anesthesia complications

## 2012-08-18 NOTE — Op Note (Signed)
[facility] [address] [place], [address]   ENDOSCOPIC ULTRASOUND PROCEDURE REPORT  PATIENT: [name], [name]  MR#: [id number] BIRTHDATE: 1956/03/10  GENDER: Male ENDOSCOPIST: Jeani Hawking, MD REFERRED BY: PROCEDURE DATE:  08/18/2012 PROCEDURE:   Upper EUS w/FNA ASA CLASS:      Class III INDICATIONS:   1.  abnormal CT of the GI tract. MEDICATIONS: MAC sedation, administered by CRNA  DESCRIPTION OF PROCEDURE:   After the risks benefits and alternatives of the procedure were  explained, informed consent was obtained. The patient was then placed in the left, lateral, decubitus postion and IV sedation was administered. Throughout the procedure, the patients blood pressure, pulse and oxygen saturations were monitored continuously.  Under direct visualization, the Pentax EUS Linear [id number]  endoscope was introduced through the mouth  and advanced to the second portion of the duodenum .  Water was used as necessary to provide an acoustic interface.  Upon completion of the imaging, water was removed and the patient was sent to the recovery room in satisfactory condition.    FINDINGS: An irregular area in the pancreas was noted near the portal confluence.  The parenchyma appeared to be different from the pancreatic tissue in the neck of the pancreas.  This area that was identified correlated with the indeterminant hypoattenuated lesion on the CT scan and MRI.  Five passes with the 25 gauge FNA needle was performed.  With some of the passes, it was hard to insert the needle into the presumed mass.  No other abnormalities were identified eufint this procedure.     The scope was then withdrawn from the patient and the procedure completed.  COMPLICATIONS: There were no complications. ENDOSCOPIC VISUALIZATION:  ULTRASONIC VISUALIZATION:  STAGING:  ENDOSCOPIC IMPRESSION: 1) ? pancreatic body mass. RECOMMENDATIONS: 1) Await FNA  results.  _______________________________ eSignedJeani Hawking, MD 08/18/2012 2:36 PM   CC:  PATIENT NAME:  [name], [name] MR#: [id number]

## 2012-08-18 NOTE — Anesthesia Preprocedure Evaluation (Addendum)
Anesthesia Evaluation  Patient identified by MRN, date of birth, ID band Patient awake    Reviewed: Allergy & Precautions, H&P , NPO status , Patient's Chart, lab work & pertinent test results  Airway Mallampati: II TM Distance: >3 FB Neck ROM: full    Dental  (+) Edentulous Upper and Edentulous Lower   Pulmonary neg pulmonary ROS,  breath sounds clear to auscultation  Pulmonary exam normal       Cardiovascular Exercise Tolerance: Good hypertension, negative cardio ROS  Rhythm:regular Rate:Normal  Untreated htn   Neuro/Psych negative neurological ROS  negative psych ROS   GI/Hepatic GERD-  Medicated and Controlled,(+)     substance abuse  alcohol use, pancreatitis   Endo/Other  negative endocrine ROS  Renal/GU negative Renal ROS  negative genitourinary   Musculoskeletal   Abdominal   Peds  Hematology negative hematology ROS (+)   Anesthesia Other Findings   Reproductive/Obstetrics negative OB ROS                          Anesthesia Physical Anesthesia Plan  ASA: III  Anesthesia Plan: MAC   Post-op Pain Management:    Induction:   Airway Management Planned: Simple Face Mask  Additional Equipment:   Intra-op Plan:   Post-operative Plan:   Informed Consent: I have reviewed the patients History and Physical, chart, labs and discussed the procedure including the risks, benefits and alternatives for the proposed anesthesia with the patient or authorized representative who has indicated his/her understanding and acceptance.   Dental Advisory Given  Plan Discussed with: CRNA and Surgeon  Anesthesia Plan Comments:         Anesthesia Quick Evaluation

## 2012-08-18 NOTE — Transfer of Care (Signed)
Immediate Anesthesia Transfer of Care Note  Patient: [name]  Procedure(s) Performed: Procedure(s): UPPER ENDOSCOPIC ULTRASOUND (EUS) LINEAR (N/A)  Patient Location: PACU  Anesthesia Type:MAC  Level of Consciousness: awake, oriented and patient cooperative  Airway & Oxygen Therapy: Patient Spontanous Breathing  Post-op Assessment: Report given to PACU RN and Post -op Vital signs reviewed and stable  Post vital signs: Reviewed and stable  Complications: No apparent anesthesia complications

## 2012-08-21 ENCOUNTER — Encounter (HOSPITAL_COMMUNITY): Payer: Self-pay | Admitting: Gastroenterology

## 2012-10-30 ENCOUNTER — Other Ambulatory Visit: Payer: Self-pay | Admitting: Gastroenterology

## 2012-10-30 DIAGNOSIS — D136 Benign neoplasm of pancreas: Secondary | ICD-10-CM

## 2012-11-06 ENCOUNTER — Ambulatory Visit
Admission: RE | Admit: 2012-11-06 | Discharge: 2012-11-06 | Disposition: A | Discharge status: Home / Self Care | Payer: 59 | Source: Ambulatory Visit | Attending: Gastroenterology | Admitting: Gastroenterology

## 2012-11-06 DIAGNOSIS — D136 Benign neoplasm of pancreas: Secondary | ICD-10-CM

## 2012-11-12 ENCOUNTER — Ambulatory Visit
Admission: RE | Admit: 2012-11-12 | Discharge: 2012-11-12 | Disposition: A | Discharge status: Home / Self Care | Payer: 59 | Source: Ambulatory Visit | Attending: Gastroenterology | Admitting: Gastroenterology

## 2012-11-12 MED ORDER — GADOBENATE DIMEGLUMINE 529 MG/ML IV SOLN
16.0000 mL | Freq: Once | INTRAVENOUS | Status: AC | PRN
  Administered 2012-11-12: 16 mL via INTRAVENOUS

## 2013-09-24 ENCOUNTER — Other Ambulatory Visit: Payer: Self-pay | Admitting: Family Medicine

## 2013-09-24 DIAGNOSIS — M542 Cervicalgia: Secondary | ICD-10-CM

## 2013-09-29 ENCOUNTER — Other Ambulatory Visit: Payer: 59

## 2013-09-29 ENCOUNTER — Ambulatory Visit
Admission: RE | Admit: 2013-09-29 | Discharge: 2013-09-29 | Disposition: A | Discharge status: Home / Self Care | Payer: 59 | Source: Ambulatory Visit | Attending: Family Medicine | Admitting: Family Medicine

## 2013-09-29 DIAGNOSIS — M542 Cervicalgia: Secondary | ICD-10-CM

## 2013-11-27 ENCOUNTER — Other Ambulatory Visit: Payer: Self-pay | Admitting: Orthopedic Surgery

## 2013-11-27 DIAGNOSIS — M47812 Spondylosis without myelopathy or radiculopathy, cervical region: Secondary | ICD-10-CM

## 2013-12-24 ENCOUNTER — Ambulatory Visit
Admission: RE | Admit: 2013-12-24 | Discharge: 2013-12-24 | Disposition: A | Discharge status: Home / Self Care | Payer: 59 | Source: Ambulatory Visit | Attending: Orthopedic Surgery | Admitting: Orthopedic Surgery

## 2013-12-24 VITALS — BP 140/94 | HR 94

## 2013-12-24 DIAGNOSIS — M47812 Spondylosis without myelopathy or radiculopathy, cervical region: Secondary | ICD-10-CM

## 2013-12-24 MED ORDER — DIAZEPAM 5 MG PO TABS
10.0000 mg | ORAL_TABLET | Freq: Once | ORAL | Status: AC
  Administered 2013-12-24: 10 mg via ORAL

## 2013-12-24 MED ORDER — IOHEXOL 300 MG/ML  SOLN
10.0000 mL | Freq: Once | INTRAMUSCULAR | Status: AC | PRN
  Administered 2013-12-24: 10 mL via INTRATHECAL

## 2013-12-24 NOTE — Discharge Instructions (Signed)

## 2014-04-23 ENCOUNTER — Other Ambulatory Visit: Payer: Self-pay | Admitting: Orthopedic Surgery

## 2014-04-23 DIAGNOSIS — M542 Cervicalgia: Secondary | ICD-10-CM

## 2014-04-29 ENCOUNTER — Other Ambulatory Visit: Payer: Self-pay

## 2014-05-02 ENCOUNTER — Ambulatory Visit
Admission: RE | Admit: 2014-05-02 | Discharge: 2014-05-02 | Disposition: A | Discharge status: Home / Self Care | Payer: 59 | Source: Ambulatory Visit | Attending: Orthopedic Surgery | Admitting: Orthopedic Surgery

## 2014-05-02 ENCOUNTER — Other Ambulatory Visit: Payer: Self-pay | Admitting: Orthopedic Surgery

## 2014-05-02 DIAGNOSIS — M47812 Spondylosis without myelopathy or radiculopathy, cervical region: Secondary | ICD-10-CM

## 2014-05-02 DIAGNOSIS — M542 Cervicalgia: Secondary | ICD-10-CM

## 2014-05-02 MED ORDER — DIAZEPAM 5 MG PO TABS: 10.0000 mg | ORAL_TABLET | Freq: Once | ORAL | Status: DC

## 2014-05-02 NOTE — Discharge Instructions (Signed)

## 2014-05-02 NOTE — Progress Notes (Signed)
Myelo not done since pt has one last Oct.

## 2014-05-05 ENCOUNTER — Other Ambulatory Visit: Payer: 59

## 2014-05-05 ENCOUNTER — Ambulatory Visit
Admission: RE | Admit: 2014-05-05 | Discharge: 2014-05-05 | Disposition: A | Discharge status: Home / Self Care | Payer: 59 | Source: Ambulatory Visit | Attending: Orthopedic Surgery | Admitting: Orthopedic Surgery

## 2014-05-05 DIAGNOSIS — M47812 Spondylosis without myelopathy or radiculopathy, cervical region: Secondary | ICD-10-CM

## 2014-07-22 ENCOUNTER — Other Ambulatory Visit: Payer: Self-pay | Admitting: Family Medicine

## 2014-07-22 ENCOUNTER — Ambulatory Visit
Admission: RE | Admit: 2014-07-22 | Discharge: 2014-07-22 | Disposition: A | Discharge status: Home / Self Care | Payer: 59 | Source: Ambulatory Visit | Attending: Family Medicine | Admitting: Family Medicine

## 2014-07-22 DIAGNOSIS — N5089 Other specified disorders of the male genital organs: Secondary | ICD-10-CM

## 2014-07-29 ENCOUNTER — Encounter (HOSPITAL_COMMUNITY): Payer: Self-pay | Admitting: *Deleted

## 2014-07-29 ENCOUNTER — Inpatient Hospital Stay (HOSPITAL_COMMUNITY)
Admission: AD | Admit: 2014-07-29 | Discharge: 2014-08-05 | DRG: 728 | Disposition: A | Discharge status: Home / Self Care | Payer: 59 | Source: Ambulatory Visit | Attending: Internal Medicine | Admitting: Internal Medicine

## 2014-07-29 DIAGNOSIS — N508 Other specified disorders of male genital organs: Secondary | ICD-10-CM | POA: Diagnosis present

## 2014-07-29 DIAGNOSIS — K219 Gastro-esophageal reflux disease without esophagitis: Secondary | ICD-10-CM | POA: Diagnosis present

## 2014-07-29 DIAGNOSIS — N433 Hydrocele, unspecified: Secondary | ICD-10-CM | POA: Diagnosis present

## 2014-07-29 DIAGNOSIS — Z823 Family history of stroke: Secondary | ICD-10-CM | POA: Diagnosis not present

## 2014-07-29 DIAGNOSIS — L0390 Cellulitis, unspecified: Secondary | ICD-10-CM | POA: Diagnosis not present

## 2014-07-29 DIAGNOSIS — K7031 Alcoholic cirrhosis of liver with ascites: Secondary | ICD-10-CM | POA: Diagnosis not present

## 2014-07-29 DIAGNOSIS — F1010 Alcohol abuse, uncomplicated: Secondary | ICD-10-CM | POA: Diagnosis not present

## 2014-07-29 DIAGNOSIS — E8809 Other disorders of plasma-protein metabolism, not elsewhere classified: Secondary | ICD-10-CM | POA: Diagnosis present

## 2014-07-29 DIAGNOSIS — Z8249 Family history of ischemic heart disease and other diseases of the circulatory system: Secondary | ICD-10-CM | POA: Diagnosis not present

## 2014-07-29 DIAGNOSIS — M1990 Unspecified osteoarthritis, unspecified site: Secondary | ICD-10-CM | POA: Diagnosis present

## 2014-07-29 DIAGNOSIS — Z87891 Personal history of nicotine dependence: Secondary | ICD-10-CM

## 2014-07-29 DIAGNOSIS — T501X5A Adverse effect of loop [high-ceiling] diuretics, initial encounter: Secondary | ICD-10-CM | POA: Diagnosis not present

## 2014-07-29 DIAGNOSIS — K7460 Unspecified cirrhosis of liver: Secondary | ICD-10-CM

## 2014-07-29 DIAGNOSIS — E876 Hypokalemia: Secondary | ICD-10-CM | POA: Diagnosis not present

## 2014-07-29 DIAGNOSIS — K7030 Alcoholic cirrhosis of liver without ascites: Secondary | ICD-10-CM | POA: Diagnosis present

## 2014-07-29 DIAGNOSIS — N5089 Other specified disorders of the male genital organs: Secondary | ICD-10-CM | POA: Diagnosis present

## 2014-07-29 DIAGNOSIS — R601 Generalized edema: Secondary | ICD-10-CM | POA: Diagnosis not present

## 2014-07-29 DIAGNOSIS — D61818 Other pancytopenia: Secondary | ICD-10-CM | POA: Diagnosis present

## 2014-07-29 DIAGNOSIS — N492 Inflammatory disorders of scrotum: Secondary | ICD-10-CM | POA: Diagnosis present

## 2014-07-29 HISTORY — DX: Coagulation defect, unspecified: D68.9

## 2014-07-29 HISTORY — DX: Anxiety disorder, unspecified: F41.9

## 2014-07-29 HISTORY — DX: Other psychoactive substance abuse, uncomplicated: F19.10

## 2014-07-29 LAB — URINALYSIS, ROUTINE W REFLEX MICROSCOPIC
Glucose, UA: NEGATIVE mg/dL
Hgb urine dipstick: NEGATIVE
Ketones, ur: NEGATIVE mg/dL
Leukocytes, UA: NEGATIVE
Nitrite: NEGATIVE
PH: 6 (ref 5.0–8.0)
Protein, ur: NEGATIVE mg/dL
Urobilinogen, UA: 1 mg/dL (ref 0.0–1.0)

## 2014-07-29 LAB — CBC WITH DIFFERENTIAL/PLATELET
Basophils Absolute: 0 10*3/uL (ref 0.0–0.1)
Basophils Relative: 0 % (ref 0–1)
EOS ABS: 0.1 10*3/uL (ref 0.0–0.7)
Eosinophils Relative: 2 % (ref 0–5)
HCT: 22.6 % — ABNORMAL LOW (ref 39.0–52.0)
Hemoglobin: 8.1 g/dL — ABNORMAL LOW (ref 13.0–17.0)
Lymphocytes Relative: 17 % (ref 12–46)
Lymphs Abs: 0.6 10*3/uL — ABNORMAL LOW (ref 0.7–4.0)
MCH: 37.9 pg — ABNORMAL HIGH (ref 26.0–34.0)
MCHC: 35.8 g/dL (ref 30.0–36.0)
MCV: 105.6 fL — AB (ref 78.0–100.0)
Monocytes Absolute: 0.5 10*3/uL (ref 0.1–1.0)
Monocytes Relative: 15 % — ABNORMAL HIGH (ref 3–12)
NEUTROS PCT: 66 % (ref 43–77)
Neutro Abs: 2.4 10*3/uL (ref 1.7–7.7)
Platelets: 104 10*3/uL — ABNORMAL LOW (ref 150–400)
RBC: 2.14 MIL/uL — ABNORMAL LOW (ref 4.22–5.81)
RDW: 19.7 % — ABNORMAL HIGH (ref 11.5–15.5)
WBC: 3.6 10*3/uL — ABNORMAL LOW (ref 4.0–10.5)

## 2014-07-29 LAB — COMPREHENSIVE METABOLIC PANEL
ALK PHOS: 114 U/L (ref 38–126)
ALT: 27 U/L (ref 17–63)
ANION GAP: 7 (ref 5–15)
AST: 71 U/L — ABNORMAL HIGH (ref 15–41)
Albumin: 2 g/dL — ABNORMAL LOW (ref 3.5–5.0)
BUN: 16 mg/dL (ref 6–20)
CALCIUM: 8.5 mg/dL — AB (ref 8.9–10.3)
CO2: 26 mmol/L (ref 22–32)
Chloride: 102 mmol/L (ref 101–111)
Creatinine, Ser: 0.69 mg/dL (ref 0.61–1.24)
GFR calc Af Amer: >60 mL/min (ref >60)
GFR calc non Af Amer: >60 mL/min (ref >60)
Glucose, Bld: 131 mg/dL — ABNORMAL HIGH (ref 65–99)
POTASSIUM: 4.8 mmol/L (ref 3.5–5.1)
Sodium: 135 mmol/L (ref 135–145)
TOTAL PROTEIN: 7.5 g/dL (ref 6.5–8.1)
Total Bilirubin: 2.2 mg/dL — ABNORMAL HIGH (ref 0.3–1.2)

## 2014-07-29 LAB — PROTIME-INR
INR: 1.56 — AB (ref 0.00–1.49)
PROTHROMBIN TIME: 18.9 s — AB (ref 11.6–15.2)

## 2014-07-29 LAB — PHOSPHORUS: Phosphorus: 4.1 mg/dL (ref 2.5–4.6)

## 2014-07-29 LAB — MAGNESIUM: Magnesium: 1.3 mg/dL — ABNORMAL LOW (ref 1.7–2.4)

## 2014-07-29 LAB — APTT: aPTT: 36 seconds (ref 24–37)

## 2014-07-29 MED ORDER — ACETAMINOPHEN 325 MG PO TABS: 650.0000 mg | ORAL_TABLET | Freq: Four times a day (QID) | ORAL | Status: DC | PRN

## 2014-07-29 MED ORDER — SODIUM CHLORIDE 0.9 % IV SOLN
INTRAVENOUS | Status: AC
  Administered 2014-07-29: 22:00:00 via INTRAVENOUS

## 2014-07-29 MED ORDER — MORPHINE SULFATE 2 MG/ML IJ SOLN
1.0000 mg | INTRAMUSCULAR | Status: DC | PRN
  Administered 2014-07-29 – 2014-07-30 (×2): 1 mg via INTRAVENOUS
  Filled 2014-07-29 (×2): qty 1

## 2014-07-29 MED ORDER — FUROSEMIDE 10 MG/ML IJ SOLN
20.0000 mg | Freq: Once | INTRAMUSCULAR | Status: AC
  Administered 2014-07-29: 20 mg via INTRAVENOUS
  Filled 2014-07-29: qty 2

## 2014-07-29 MED ORDER — HYDROCODONE-ACETAMINOPHEN 5-325 MG PO TABS
1.0000 | ORAL_TABLET | ORAL | Status: DC | PRN
  Administered 2014-07-30 – 2014-07-31 (×4): 2 via ORAL
  Filled 2014-07-29 (×4): qty 2

## 2014-07-29 MED ORDER — ONDANSETRON HCL 4 MG PO TABS: 4.0000 mg | ORAL_TABLET | Freq: Four times a day (QID) | ORAL | Status: DC | PRN

## 2014-07-29 MED ORDER — ONDANSETRON HCL 4 MG/2ML IJ SOLN: 4.0000 mg | Freq: Four times a day (QID) | INTRAMUSCULAR | Status: DC | PRN

## 2014-07-29 MED ORDER — ACETAMINOPHEN 650 MG RE SUPP: 650.0000 mg | Freq: Four times a day (QID) | RECTAL | Status: DC | PRN

## 2014-07-29 NOTE — Progress Notes (Addendum)
Request for transfer and direct admission from [name] office, pt with progressively worsening scrotal swelling and now worsening cellulitis. VSS. Pt needs to come in for treatment with IV Vanc and even possibly broader coverage. PT also with known history of thrombocytopenia of unknown etiology (apparently has had biopsy of the bone marrow in the past. Medical bed requested. Review of records indicate pt has history of alcohol abuse and hepatitis, pancreatitis.   Faye Ramsay, MD  [facility] Pager [phone]  If 7PM-7AM, please contact night-coverage [email or web address] Password [email or web address]

## 2014-07-29 NOTE — H&P (Signed)
[facility] History and Physical  [name] VFI:[id number] DOB: 03-28-1955 DOA: 07/29/2014  Referring physician: Referred by GU: [name] PCP: Woody Seller, MD  Chief Complaint: scrotal swelling   HPI:  59 year old male with past medical history of alcohol use, pancreatitis who presented from [facility] office (referred by [name]) for main concern that patient has worsening scrotal swelling and concern for cellulitis. Patient reports pain especially with movement and seems to be relatively stable with rest. Pain is dull, intermittent, about 4/10 in intensity in scrotal area, non radiating. No reports of fevers or chills. No dysuria or hematuria. No other complaints of chest pain, palpitations, shortness of breath. No abdominal pain, nausea or vomiting. No blood in stool. No lightheadedness.  No admission labs since pt is being directly admitted. Vitals signs on arrival are stable, BP 121/69, HR 88, RR 18. Pt will be started on vanco and zosyn for cellulitis.  Further treatment and management dependant on admission labs.   Assessment & Plan    Principal Problem:   Cellulitis / Scrotal swelling - Started vanco and zosyn  - Will hold off on IV fluids since pt has extensive scrotal swelling and LE edema - Will give small lasix bolus 20 mg IV once - Obtain admission labs   DVT prophylaxis:  - SCD's bilaterally   Radiological Exams on Admission: No results found.   Code Status: Full Family Communication: Plan of care discussed with the patient  Disposition Plan: Admit for further evaluation, medical floor   Leisa Lenz, MD  [facility] Pager [phone]  Time spent in minutes: 55 minutes  Review of Systems:  Constitutional: Negative for fever, chills and malaise/fatigue. Negative for diaphoresis.  HENT: Negative for hearing loss, ear pain, nosebleeds, congestion, sore throat, neck pain, tinnitus and ear discharge.   Eyes: Negative for blurred vision, double  vision, photophobia, pain, discharge and redness.  Respiratory: Negative for cough, hemoptysis, sputum production, shortness of breath, wheezing and stridor.   Cardiovascular: Negative for chest pain, palpitations, orthopnea, claudication and leg swelling.  Gastrointestinal: Negative for nausea, vomiting and abdominal pain. Negative for heartburn, constipation, blood in stool and melena.  Genitourinary: Per HPI.  Musculoskeletal: Negative for myalgias, back pain, joint pain and falls.  Skin: Negative for itching and rash.  Neurological: Negative for dizziness and weakness. Negative for tingling, tremors, sensory change, speech change, focal weakness, loss of consciousness and headaches.  Endo/Heme/Allergies: Negative for environmental allergies and polydipsia. Does not bruise/bleed easily.  Psychiatric/Behavioral: Negative for suicidal ideas. The patient is not nervous/anxious.      Past Medical History  Diagnosis Date  . GERD (gastroesophageal reflux disease)   . 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 Transfusion history 08-16-12    '99  . Pancreatitis 08-16-12    at present weakness,fatiques easily  . Foreign body 08-16-12    nose"BB" pellet since age 77  . History of ETOH abuse 08-16-12    Heavy alcohol use daily  . Anemia   . Anxiety   . Clotting disorder   . Substance abuse    Past Surgical History  Procedure Laterality Date  . Neck surgery      fusion of neck-titanium hardware inplaced  . Esophagogastroduodenoscopy    . Colonoscopy    . Eus N/A 08/18/2012    Procedure: UPPER ENDOSCOPIC ULTRASOUND (EUS) LINEAR;  Surgeon: Beryle Beams, MD;  Location: [facility];  Service: Endoscopy;  Laterality: N/A;  . Neck surgery  [phone]  Social History:  reports that he quit smoking about 8 years ago. His smoking use included Cigarettes. He has never used smokeless tobacco. He reports that he does not drink alcohol or use illicit drugs.  Allergies  Allergen Reactions  . Aspirin  Other (See Comments)    Has upset stomach in high doses only. Took 4 aspirin with EMS, no reaction.    Family History:  Family History  Problem Relation Age of Onset  . Hypertension Father   . Cancer Mother   . Stroke Mother      Prior to Admission medications   Medication Sig Start Date End Date Taking? Authorizing Provider  calcium carbonate (TUMS - DOSED IN MG ELEMENTAL CALCIUM) 500 MG chewable tablet Chew 1 tablet by mouth daily.   Yes Historical Provider, MD  ciprofloxacin (CIPRO) 500 MG tablet Take 500 mg by mouth 2 (two) times daily.  07/24/14  Yes Historical Provider, MD  Hypromellose (ARTIFICIAL TEARS OP) Place 2 drops into the right eye daily as needed (irritation). For redness/itchy   Yes Historical Provider, MD  naproxen sodium (ANAPROX) 220 MG tablet Take 440 mg by mouth 2 (two) times daily as needed (pain).   Yes Historical Provider, MD  spironolactone (ALDACTONE) 25 MG tablet Take 25 mg by mouth daily.  07/16/14  Yes Historical Provider, MD  traZODone (DESYREL) 50 MG tablet Take 50 mg by mouth daily as needed for sleep (sleep).  07/04/14  Yes Historical Provider, MD   Physical Exam: Filed Vitals:   07/29/14 1745  BP: 121/69  Pulse: 88  Temp: 98.2 F (36.8 C)  TempSrc: Oral  Resp: 18  Height: 5\' 7"  (1.702 m)  Weight: 84.823 kg (187 lb)  SpO2: 100%    Physical Exam  Constitutional: Appears well-developed and well-nourished. No distress.  HENT: Normocephalic. No tonsillar erythema or exudates Eyes: Conjunctivae and EOM are normal. PERRLA, no scleral icterus.  Neck: Normal ROM. Neck supple. No JVD. No tracheal deviation. No thyromegaly.  CVS: RRR, S1/S2 +, no murmurs, no gallops, no carotid bruit.  Pulmonary: Effort and breath sounds normal, no stridor, rhonchi, wheezes, rales.  Abdominal: Soft. BS +,  no distension, tenderness, rebound or guarding.  GU: scrotal swelling appreciated with tender to palpation  Musculoskeletal: Normal range of motion. +2 LE pitting  edema,no tenderness.  Lymphadenopathy: No lymphadenopathy noted, cervical, inguinal. Neuro: Alert. Normal reflexes, muscle tone coordination. No focal neurologic deficits. Skin: Skin is warm and dry. No rash noted.  No erythema. No pallor.  Psychiatric: Normal mood and affect. Behavior, judgment, thought content normal.   Labs on Admission:   Microbiology: No results found for this or any previous visit (from the past 240 hour(s)).   Labs: Basic Metabolic Panel: No results for input(s): NA, K, CL, CO2, GLUCOSE, BUN, CREATININE, CALCIUM, MG, PHOS in the last 168 hours. Liver Function Tests: No results for input(s): AST, ALT, ALKPHOS, BILITOT, PROT, ALBUMIN in the last 168 hours. No results for input(s): LIPASE, AMYLASE in the last 168 hours. No results for input(s): AMMONIA in the last 168 hours. CBC: No results for input(s): WBC, NEUTROABS, HGB, HCT, MCV, PLT in the last 168 hours. Cardiac Enzymes: No results for input(s): CKTOTAL, CKMB, CKMBINDEX, TROPONINI in the last 168 hours. BNP: BNP (last 3 results) No results for input(s): BNP in the last 8760 hours.  ProBNP (last 3 results) No results for input(s): PROBNP in the last 8760 hours.  CBG: No results for input(s): GLUCAP in the last 168 hours.  Liver Function Tests: Cardiac Enzymes: No results for input(s): CKTOTAL, CKMB, CKMBINDEX, TROPONINI in the last 168 hours. BNP: Invalid input(s): POCBNP CBG: No results for input(s): GLUCAP in the last 168 hours.  If 7PM-7AM, please contact night-coverage [email or web address] Password [email or web address] 07/29/2014, 9:03 PM

## 2014-07-30 DIAGNOSIS — F1010 Alcohol abuse, uncomplicated: Secondary | ICD-10-CM

## 2014-07-30 LAB — RAPID URINE DRUG SCREEN, HOSP PERFORMED
Amphetamines: NOT DETECTED
BARBITURATES: NOT DETECTED
Benzodiazepines: NOT DETECTED
Cocaine: NOT DETECTED
Opiates: POSITIVE — AB
TETRAHYDROCANNABINOL: NOT DETECTED

## 2014-07-30 LAB — COMPREHENSIVE METABOLIC PANEL
ALBUMIN: 1.7 g/dL — AB (ref 3.5–5.0)
ALT: 21 U/L (ref 17–63)
AST: 59 U/L — AB (ref 15–41)
Alkaline Phosphatase: 92 U/L (ref 38–126)
Anion gap: 6 (ref 5–15)
BILIRUBIN TOTAL: 2 mg/dL — AB (ref 0.3–1.2)
BUN: 16 mg/dL (ref 6–20)
CO2: 26 mmol/L (ref 22–32)
CREATININE: 0.69 mg/dL (ref 0.61–1.24)
Calcium: 7.8 mg/dL — ABNORMAL LOW (ref 8.9–10.3)
Chloride: 101 mmol/L (ref 101–111)
GFR calc Af Amer: >60 mL/min (ref >60)
GFR calc non Af Amer: >60 mL/min (ref >60)
Glucose, Bld: 142 mg/dL — ABNORMAL HIGH (ref 65–99)
POTASSIUM: 3.3 mmol/L — AB (ref 3.5–5.1)
Sodium: 133 mmol/L — ABNORMAL LOW (ref 135–145)
TOTAL PROTEIN: 6.2 g/dL — AB (ref 6.5–8.1)

## 2014-07-30 LAB — ETHANOL: Alcohol, Ethyl (B): <5 mg/dL (ref <5)

## 2014-07-30 LAB — CBC
HCT: 20.1 % — ABNORMAL LOW (ref 39.0–52.0)
Hemoglobin: 7 g/dL — ABNORMAL LOW (ref 13.0–17.0)
MCH: 36.6 pg — ABNORMAL HIGH (ref 26.0–34.0)
MCHC: 34.8 g/dL (ref 30.0–36.0)
MCV: 105.2 fL — ABNORMAL HIGH (ref 78.0–100.0)
PLATELETS: 73 10*3/uL — AB (ref 150–400)
RBC: 1.91 MIL/uL — ABNORMAL LOW (ref 4.22–5.81)
RDW: 19.8 % — AB (ref 11.5–15.5)
WBC: 2.5 10*3/uL — ABNORMAL LOW (ref 4.0–10.5)

## 2014-07-30 LAB — URINE CULTURE
Colony Count: NO GROWTH
Culture: NO GROWTH

## 2014-07-30 LAB — PREPARE RBC (CROSSMATCH)

## 2014-07-30 LAB — GLUCOSE, CAPILLARY: Glucose-Capillary: 97 mg/dL (ref 65–99)

## 2014-07-30 LAB — ABO/RH: ABO/RH(D): A POS

## 2014-07-30 MED ORDER — VITAMIN B-1 100 MG PO TABS
100.0000 mg | ORAL_TABLET | Freq: Every day | ORAL | Status: DC
  Administered 2014-07-30 – 2014-08-05 (×7): 100 mg via ORAL
  Filled 2014-07-30 (×7): qty 1

## 2014-07-30 MED ORDER — POTASSIUM CHLORIDE CRYS ER 20 MEQ PO TBCR
40.0000 meq | EXTENDED_RELEASE_TABLET | Freq: Once | ORAL | Status: AC
  Administered 2014-07-30: 40 meq via ORAL
  Filled 2014-07-30: qty 2

## 2014-07-30 MED ORDER — VANCOMYCIN HCL 10 G IV SOLR
1250.0000 mg | Freq: Two times a day (BID) | INTRAVENOUS | Status: DC
  Administered 2014-07-30 – 2014-08-01 (×6): 1250 mg via INTRAVENOUS
  Filled 2014-07-30 (×7): qty 1250

## 2014-07-30 MED ORDER — FOLIC ACID 1 MG PO TABS
1.0000 mg | ORAL_TABLET | Freq: Every day | ORAL | Status: DC
  Administered 2014-07-30 – 2014-08-05 (×7): 1 mg via ORAL
  Filled 2014-07-30 (×7): qty 1

## 2014-07-30 MED ORDER — MAGNESIUM SULFATE 2 GM/50ML IV SOLN
2.0000 g | Freq: Once | INTRAVENOUS | Status: AC
  Administered 2014-07-30: 2 g via INTRAVENOUS
  Filled 2014-07-30: qty 50

## 2014-07-30 MED ORDER — SODIUM CHLORIDE 0.9 % IV SOLN
Freq: Once | INTRAVENOUS | Status: AC
  Administered 2014-07-30: 19:00:00 via INTRAVENOUS

## 2014-07-30 MED ORDER — FUROSEMIDE 10 MG/ML IJ SOLN
20.0000 mg | Freq: Once | INTRAMUSCULAR | Status: AC
  Administered 2014-07-30: 20 mg via INTRAVENOUS
  Filled 2014-07-30: qty 2

## 2014-07-30 MED ORDER — PIPERACILLIN-TAZOBACTAM 3.375 G IVPB
3.3750 g | Freq: Three times a day (TID) | INTRAVENOUS | Status: DC
Start: 2014-07-30 — End: 2014-08-01
  Administered 2014-07-30 – 2014-08-01 (×9): 3.375 g via INTRAVENOUS
  Filled 2014-07-30 (×10): qty 50

## 2014-07-30 NOTE — Progress Notes (Addendum)
Order placed for zosyn and vanco Leisa Lenz

## 2014-07-30 NOTE — Progress Notes (Signed)
ANTIBIOTIC CONSULT NOTE - INITIAL  Pharmacy Consult for Vancomycin and Zosyn  Indication: Scrotal swelling/cellulitis  Allergies  Allergen Reactions  . Aspirin Other (See Comments)    Has upset stomach in high doses only. Took 4 aspirin with EMS, no reaction.    Patient Measurements: Height: 5\' 7"  (170.2 cm) Weight: 187 lb (84.823 kg) IBW/kg (Calculated) : 66.1 Adjusted Body Weight:   Vital Signs: Temp: 98.1 F (36.7 C) (05/16 2237) Temp Source: Oral (05/16 2237) BP: 118/72 mmHg (05/16 2237) Pulse Rate: 89 (05/16 2237) Intake/Output from previous day: 05/16 0701 - 05/17 0700 In: -  Out: 150 [Urine:150] Intake/Output from this shift: Total I/O In: -  Out: 50 [Urine:50]  Labs:  Recent Labs  07/29/14 1757  WBC 3.6*  HGB 8.1*  PLT 104*  CREATININE 0.69   Estimated Creatinine Clearance: 103.5 mL/min (by C-G formula based on Cr of 0.69). No results for input(s): VANCOTROUGH, VANCOPEAK, VANCORANDOM, GENTTROUGH, GENTPEAK, GENTRANDOM, TOBRATROUGH, TOBRAPEAK, TOBRARND, AMIKACINPEAK, AMIKACINTROU, AMIKACIN in the last 72 hours.   Microbiology: No results found for this or any previous visit (from the past 720 hour(s)).  Medical History: Past Medical History  Diagnosis Date  . GERD (gastroesophageal reflux disease)   . 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 Transfusion history 08-16-12    '99  . Pancreatitis 08-16-12    at present weakness,fatiques easily  . Foreign body 08-16-12    nose"BB" pellet since age 40  . History of ETOH abuse 08-16-12    Heavy alcohol use daily  . Anemia   . Anxiety   . Clotting disorder   . Substance abuse     Medications:  Anti-infectives    Start     Dose/Rate Route Frequency Ordered Stop   07/30/14 0045  piperacillin-tazobactam (ZOSYN) IVPB 3.375 g     3.375 g 12.5 mL/hr over 240 Minutes Intravenous 3 times per day 07/30/14 0034     07/30/14 0045  vancomycin (VANCOCIN) 1,250 mg in sodium chloride 0.9 % 250 mL IVPB     1,250  mg 166.7 mL/hr over 90 Minutes Intravenous 2 times daily 07/30/14 0034       Assessment: Patient with scrotal swelling/cellulitis.  Goal of Therapy:  Vancomycin trough level 10-15 mcg/ml  Zosyn based on renal function   Plan:  Measure antibiotic drug levels at steady state Follow up culture results Vancomycin 1250mg  iv q12hr  Zosyn 3.375g IV Q8H infused over 4hrs.   Tyler Deis, Shea Stakes Crowford 07/30/2014,12:37 AM

## 2014-07-30 NOTE — Progress Notes (Addendum)
Patient ID: [name], male   DOB: 02/18/1956, 59 y.o.   MRN: [id number] [facility] PROGRESS NOTE  [name] YTK:[id number] DOB: 06/23/1955 DOA: 07/29/2014 PCP: Woody Seller, MD  Brief narrative:    59 year old male with past medical history of alcohol use, pancreatitis who presented from [facility] office (referred by [name]) because patient had worsening scrotal swelling and concern for cellulitis.   On admission, patient was started on broad spectrum antibiotics, vanco and zosyn. He reports swelling is improving. Hospital course complicated with anemia and hemoglobin of 7.0 for which he will be receiving 1 unit of PRBC today.   Barrier to discharge: Patient is receiving IV antibiotics for scrotal cellulitis. He will also get bolus Lasix 20 mg IV today. Because his hemoglobin is 7 he will receive 1 unit of PRBC today. If he remains stable, he is hemoglobin and platelet count is stable and swelling improves then I anticipated discharge 07/31/2014.    Assessment/Plan:    Principal Problem:  Cellulitis / Scrotal swelling / leukopenia  - Cncern for scrotal cellulitis. Patient was started on vancomycin and Zosyn. Swelling improving. - Patient did get 1 dose of Lasix 20 mg IV at the time of the admission which likely has contributed to improvement in swelling. We will place order for additional 20 m of Lasix IV today. - Follow-up CBC tomorrow.  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 - White blood cell count is 2.5 this morning, no neutropenia. - Hemoglobin is 7 this morning, dropped from 8.1 on the admission. Please give 1 unit of PRBC today. - Platelet count 73 this morning, dropped noted from 104 on the admission. - Continue to monitor CBC daily. - No reports of gross bleed.  History of alcohol abuse - Check urine drug screen and alcohol level. -  Continue folic acid and thiamine.  Hypokalemia - Secondary to Lasix received on the admission for swelling. - Supplemented. - Follow-up BMP tomorrow morning.   DVT prophylaxis:  - SCD's bilaterally because of thrombocytopenia    Code Status: Full Family Communication: Plan of care discussed with the patient  Disposition Plan: Admit for further evaluation, medical floor    DVT Prophylaxis  - SCD's bilaterally   Code Status: Full.  Family Communication:  plan of care discussed with the patient Disposition Plan: Home liekly 07/31/2014   IV access:  Peripheral IV  Procedures and diagnostic studies:    No results found.  Medical Consultants:  None   Other Consultants:  None   IAnti-Infectives:   Vanco 07/29/2014 -->  Zosyn 07/29/2014 -->    Rodolfo Notaro, MD  [facility] Pager [phone]  Time spent in minutes: 25 minutes  If 7PM-7AM, please contact night-coverage [email or web address] Password [email or web address] 07/30/2014, 12:16 PM   LOS: 1 day    HPI/Subjective: No acute overnight events. Patient reports scrotal swelling improved.   Objective: Filed Vitals:   07/29/14 1745 07/29/14 2237 07/30/14 0458  BP: 121/69 118/72 97/60  Pulse: 88 89 84  Temp: 98.2 F (36.8 C) 98.1 F (36.7 C) 98.2 F (36.8 C)  TempSrc: Oral Oral Oral  Resp: 18 18 18   Height: 5\' 7"  (1.702 m)    Weight: 84.823 kg (187 lb)  87.544 kg (193 lb)  SpO2: 100% 100% 98%    Intake/Output Summary (Last 24 hours) at 07/30/14 1216 Last data filed at 07/30/14 1058  Gross per 24  hour  Intake 672.67 ml  Output    850 ml  Net -177.33 ml    Exam:   General:  Pt is alert, follows commands appropriately, not in acute distress  Cardiovascular: Regular rate and rhythm, S1/S2, no murmurs  Respiratory: Clear to auscultation bilaterally, no wheezing, no crackles, no rhonchi  Abdomen: Soft, non tender, non distended, bowel sounds present  Extremities: LE edema (+), pulses DP and PT palpable  bilaterally  Neuro: Grossly nonfocal  Data Reviewed: Basic Metabolic Panel:  Recent Labs Lab 07/29/14 1757 07/30/14 0420  NA 135 133*  K 4.8 3.3*  CL 102 101  CO2 26 26  GLUCOSE 131* 142*  BUN 16 16  CREATININE 0.69 0.69  CALCIUM 8.5* 7.8*  MG 1.3*  --   PHOS 4.1  --    Liver Function Tests:  Recent Labs Lab 07/29/14 1757 07/30/14 0420  AST 71* 59*  ALT 27 21  ALKPHOS 114 92  BILITOT 2.2* 2.0*  PROT 7.5 6.2*  ALBUMIN 2.0* 1.7*   No results for input(s): LIPASE, AMYLASE in the last 168 hours. No results for input(s): AMMONIA in the last 168 hours. CBC:  Recent Labs Lab 07/29/14 1757 07/30/14 0420  WBC 3.6* 2.5*  NEUTROABS 2.4  --   HGB 8.1* 7.0*  HCT 22.6* 20.1*  MCV 105.6* 105.2*  PLT 104* 73*   Cardiac Enzymes: No results for input(s): CKTOTAL, CKMB, CKMBINDEX, TROPONINI in the last 168 hours. BNP: Invalid input(s): POCBNP CBG:  Recent Labs Lab 07/30/14 0738  GLUCAP 97    No results found for this or any previous visit (from the past 240 hour(s)).   Scheduled Meds: . furosemide  20 mg Intravenous Once  . magnesium sulfate 1 - 4 g bolus IVPB  2 g Intravenous Once  . piperacillin-tazobactam (ZOSYN)  IV  3.375 g Intravenous 3 times per day  . potassium chloride  40 mEq Oral Once  . vancomycin  1,250 mg Intravenous BID   Continuous Infusions: . sodium chloride 10 mL/hr at 07/29/14 2144

## 2014-07-31 ENCOUNTER — Inpatient Hospital Stay (HOSPITAL_COMMUNITY): Payer: 59

## 2014-07-31 DIAGNOSIS — L0390 Cellulitis, unspecified: Secondary | ICD-10-CM

## 2014-07-31 DIAGNOSIS — D61818 Other pancytopenia: Secondary | ICD-10-CM

## 2014-07-31 DIAGNOSIS — N508 Other specified disorders of male genital organs: Secondary | ICD-10-CM

## 2014-07-31 DIAGNOSIS — R601 Generalized edema: Secondary | ICD-10-CM

## 2014-07-31 LAB — BASIC METABOLIC PANEL
ANION GAP: 5 (ref 5–15)
BUN: 20 mg/dL (ref 6–20)
CO2: 25 mmol/L (ref 22–32)
Calcium: 8.1 mg/dL — ABNORMAL LOW (ref 8.9–10.3)
Chloride: 105 mmol/L (ref 101–111)
Creatinine, Ser: 0.84 mg/dL (ref 0.61–1.24)
GFR calc Af Amer: >60 mL/min (ref >60)
GFR calc non Af Amer: >60 mL/min (ref >60)
GLUCOSE: 122 mg/dL — AB (ref 65–99)
POTASSIUM: 4 mmol/L (ref 3.5–5.1)
SODIUM: 135 mmol/L (ref 135–145)

## 2014-07-31 LAB — GLUCOSE, CAPILLARY: GLUCOSE-CAPILLARY: 113 mg/dL — AB (ref 65–99)

## 2014-07-31 LAB — CBC
HCT: 23.8 % — ABNORMAL LOW (ref 39.0–52.0)
HEMOGLOBIN: 8.1 g/dL — AB (ref 13.0–17.0)
MCH: 34.8 pg — AB (ref 26.0–34.0)
MCHC: 34 g/dL (ref 30.0–36.0)
MCV: 102.1 fL — ABNORMAL HIGH (ref 78.0–100.0)
PLATELETS: 62 10*3/uL — AB (ref 150–400)
RBC: 2.33 MIL/uL — AB (ref 4.22–5.81)
RDW: 21.2 % — ABNORMAL HIGH (ref 11.5–15.5)
WBC: 2.7 10*3/uL — AB (ref 4.0–10.5)

## 2014-07-31 LAB — TYPE AND SCREEN
ABO/RH(D): A POS
ANTIBODY SCREEN: NEGATIVE
Unit division: 0

## 2014-07-31 MED ORDER — HYDROCODONE-ACETAMINOPHEN 5-325 MG PO TABS
1.0000 | ORAL_TABLET | Freq: Four times a day (QID) | ORAL | Status: DC | PRN
  Administered 2014-07-31 – 2014-08-04 (×12): 1 via ORAL
  Filled 2014-07-31 (×12): qty 1

## 2014-07-31 MED ORDER — FUROSEMIDE 10 MG/ML IJ SOLN
40.0000 mg | Freq: Two times a day (BID) | INTRAMUSCULAR | Status: DC
  Administered 2014-07-31 – 2014-08-01 (×2): 40 mg via INTRAVENOUS
  Filled 2014-07-31 (×4): qty 4

## 2014-07-31 MED ORDER — MORPHINE SULFATE 2 MG/ML IJ SOLN: 1.0000 mg | INTRAMUSCULAR | Status: DC | PRN

## 2014-07-31 NOTE — Progress Notes (Addendum)
PROGRESS NOTE    [name] POE:[id number] DOB: 07-06-1955 DOA: 07/29/2014 PCP: Woody Seller, MD  HPI/Brief narrative 59 year old male with past medical history of alcohol use, pancreatitis who presented from [facility] office (referred by [name]) because patient had worsening scrotal swelling and concern for cellulitis.   On admission, patient was started on broad spectrum antibiotics, vanco and zosyn. He reports swelling is improving. Hospital course complicated with anemia and hemoglobin of 7.0 & s/p 1 unit of PRBC today.    Assessment/Plan:  Principal Problem:  Cellulitis / Scrotal swelling / leukopenia  - Concern for scrotal cellulitis on admission - Started empirically on IV Zosyn and vancomycin - His clinical picture is most likely complicated by significant anasarca. He received when necessary doses of IV Lasix with improvement in swelling. 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 - Status post 1 unit of PRBC on 5/17 for hemoglobin 7 g per DL. Hemoglobin has improved to 8.1. - No reported bleeding or fevers. - Follow daily CBCs.  History of alcohol abuse - Blood alcohol level less than 5. UDS positive for opiates - Continue folic acid and thiamine. - No overt withdrawal features.  Hypokalemia - Replace as needed and follow  Anasarca - Likely due to hypoalbuminemia/? Cirrhosis and nutritional - Most likely contributing to significant lower extremity edema extending to external genitalia - IV Lasix - Check right upper quadrant ultrasound and 2-D echo  Subtle low attenuation lesion in the pancreatic body/tail seen on CT abdomen in 2014 - Patient states that he is awaiting biopsy via PCP but unable to do secondary to thrombocytopenia. Outpatient follow-up.  Likely alcoholic Cirrhosis - As per spouse, patient sees a hematologist in  [facility] and was supposed to undergo? Liver biopsy but precluded by thrombocytopenia. - He apparently has significantly cut down on his alcohol intake.   Code Status: Full Family Communication: None at bedside Disposition Plan: Home when medically stable-possibly in the next 3-4 days   Consultants:  None  Procedures:  None  Antibiotics:  IV vancomycin  IV Zosyn   Subjective: Overall feels better. Scrotal edema, pain have improved by 60%. Not urinating much. Denies history of heart disease.  Objective: Filed Vitals:   07/30/14 1900 07/30/14 2155 07/31/14 0543 07/31/14 1411  BP: 84/49 95/58 94/64  96/65  Pulse: 58 65 79 82  Temp: 97.8 F (36.6 C) 97.5 F (36.4 C) 97.7 F (36.5 C) 98.2 F (36.8 C)  TempSrc: Oral Oral Oral Oral  Resp: 18 16 16 18   Height:      Weight:   89.858 kg (198 lb 1.6 oz)   SpO2: 98% 96% 97% 100%    Intake/Output Summary (Last 24 hours) at 07/31/14 1414 Last data filed at 07/31/14 1413  Gross per 24 hour  Intake   1465 ml  Output    775 ml  Net    690 ml   Filed Weights   07/29/14 1745 07/30/14 0458 07/31/14 0543  Weight: 84.823 kg (187 lb) 87.544 kg (193 lb) 89.858 kg (198 lb 1.6 oz)     Exam:  General exam: Pleasant middle-aged male lying comfortably in bed Respiratory system: Clear. No increased work of breathing. Cardiovascular system: S1 & S2 heard, RRR. No JVD, murmurs, gallops, clicks. 2+ pitting bilateral leg edema extending to the external genitalia (scrotal edema, minimal patchy erythema  of scrotal skin, penile edema) Gastrointestinal system: Abdomen is nondistended, soft and nontender. Normal bowel sounds heard. Central nervous system: Alert and oriented. No focal neurological deficits. Extremities: Symmetric 5 x 5 power.   Data Reviewed: Basic Metabolic Panel:  Recent Labs Lab 07/29/14 1757 07/30/14 0420 07/31/14 0350  NA 135 133* 135  K 4.8 3.3* 4.0  CL 102 101 105  CO2 26 26 25   GLUCOSE 131* 142* 122*    BUN 16 16 20   CREATININE 0.69 0.69 0.84  CALCIUM 8.5* 7.8* 8.1*  MG 1.3*  --   --   PHOS 4.1  --   --    Liver Function Tests:  Recent Labs Lab 07/29/14 1757 07/30/14 0420  AST 71* 59*  ALT 27 21  ALKPHOS 114 92  BILITOT 2.2* 2.0*  PROT 7.5 6.2*  ALBUMIN 2.0* 1.7*   No results for input(s): LIPASE, AMYLASE in the last 168 hours. No results for input(s): AMMONIA in the last 168 hours. CBC:  Recent Labs Lab 07/29/14 1757 07/30/14 0420 07/31/14 0350  WBC 3.6* 2.5* 2.7*  NEUTROABS 2.4  --   --   HGB 8.1* 7.0* 8.1*  HCT 22.6* 20.1* 23.8*  MCV 105.6* 105.2* 102.1*  PLT 104* 73* 62*   Cardiac Enzymes: No results for input(s): CKTOTAL, CKMB, CKMBINDEX, TROPONINI in the last 168 hours. BNP (last 3 results) No results for input(s): PROBNP in the last 8760 hours. CBG:  Recent Labs Lab 07/30/14 0738 07/31/14 0724  GLUCAP 97 113*    Recent Results (from the past 240 hour(s))  Urine culture     Status: None   Collection Time: 07/29/14  9:52 PM  Result Value Ref Range Status   Specimen Description URINE, CLEAN CATCH  Final   Special Requests NONE  Final   Colony Count NO GROWTH Performed at [facility]   Final   Culture NO GROWTH Performed at [facility]   Final   Report Status 07/30/2014 FINAL  Final           Studies: No results found.      Scheduled Meds: . folic acid  1 mg Oral Daily  . piperacillin-tazobactam (ZOSYN)  IV  3.375 g Intravenous 3 times per day  . thiamine  100 mg Oral Daily  . vancomycin  1,250 mg Intravenous BID   Continuous Infusions:   Principal Problem:   Cellulitis Active Problems:   Scrotal swelling    Time spent: 30 minutes.    Vernell Leep, MD, FACP, FHM. [facility] Pager [phone]  If 7PM-7AM, please contact night-coverage [email or web address] Password [email or web address] 07/31/2014, 2:14 PM    LOS: 2 days

## 2014-08-01 ENCOUNTER — Inpatient Hospital Stay (HOSPITAL_COMMUNITY): Payer: 59

## 2014-08-01 DIAGNOSIS — K7031 Alcoholic cirrhosis of liver with ascites: Secondary | ICD-10-CM

## 2014-08-01 DIAGNOSIS — E876 Hypokalemia: Secondary | ICD-10-CM

## 2014-08-01 LAB — BASIC METABOLIC PANEL
Anion gap: 7 (ref 5–15)
BUN: 19 mg/dL (ref 6–20)
CO2: 25 mmol/L (ref 22–32)
Calcium: 8.1 mg/dL — ABNORMAL LOW (ref 8.9–10.3)
Chloride: 102 mmol/L (ref 101–111)
Creatinine, Ser: 0.76 mg/dL (ref 0.61–1.24)
GFR calc Af Amer: >60 mL/min (ref >60)
GFR calc non Af Amer: >60 mL/min (ref >60)
GLUCOSE: 106 mg/dL — AB (ref 65–99)
Potassium: 3.4 mmol/L — ABNORMAL LOW (ref 3.5–5.1)
SODIUM: 134 mmol/L — AB (ref 135–145)

## 2014-08-01 LAB — CBC
HEMATOCRIT: 24.4 % — AB (ref 39.0–52.0)
HEMOGLOBIN: 8.6 g/dL — AB (ref 13.0–17.0)
MCH: 35.8 pg — ABNORMAL HIGH (ref 26.0–34.0)
MCHC: 35.2 g/dL (ref 30.0–36.0)
MCV: 101.7 fL — AB (ref 78.0–100.0)
Platelets: 103 10*3/uL — ABNORMAL LOW (ref 150–400)
RBC: 2.4 MIL/uL — AB (ref 4.22–5.81)
RDW: 21.3 % — ABNORMAL HIGH (ref 11.5–15.5)
WBC: 2.9 10*3/uL — ABNORMAL LOW (ref 4.0–10.5)

## 2014-08-01 LAB — GLUCOSE, CAPILLARY: GLUCOSE-CAPILLARY: 121 mg/dL — AB (ref 65–99)

## 2014-08-01 MED ORDER — SPIRONOLACTONE 25 MG PO TABS
25.0000 mg | ORAL_TABLET | Freq: Every day | ORAL | Status: DC
  Administered 2014-08-01 – 2014-08-02 (×2): 25 mg via ORAL
  Filled 2014-08-01 (×2): qty 1

## 2014-08-01 MED ORDER — DOXYCYCLINE HYCLATE 100 MG PO TABS
100.0000 mg | ORAL_TABLET | Freq: Two times a day (BID) | ORAL | Status: DC
  Administered 2014-08-01 – 2014-08-05 (×8): 100 mg via ORAL
  Filled 2014-08-01 (×10): qty 1

## 2014-08-01 MED ORDER — POTASSIUM CHLORIDE CRYS ER 20 MEQ PO TBCR
40.0000 meq | EXTENDED_RELEASE_TABLET | Freq: Once | ORAL | Status: AC
  Administered 2014-08-01: 40 meq via ORAL
  Filled 2014-08-01: qty 2

## 2014-08-01 MED ORDER — DOXYCYCLINE HYCLATE 100 MG PO TABS: 100.0000 mg | ORAL_TABLET | Freq: Two times a day (BID) | ORAL | Status: DC

## 2014-08-01 MED ORDER — FUROSEMIDE 10 MG/ML IJ SOLN
80.0000 mg | Freq: Two times a day (BID) | INTRAMUSCULAR | Status: DC
  Administered 2014-08-01 – 2014-08-04 (×7): 80 mg via INTRAVENOUS
  Filled 2014-08-01 (×10): qty 8

## 2014-08-01 NOTE — Progress Notes (Signed)
PROGRESS NOTE    Prabhav Faulkenberry CBS:[id number] DOB: Jun 11, 1955 DOA: 07/29/2014 PCP: Woody Seller, MD  HPI/Brief narrative 59 year old male with past medical history of alcohol use, pancreatitis who presented from GU office (referred by [name]) because patient had worsening scrotal swelling and concern for cellulitis.   On admission, patient was started on broad spectrum antibiotics, vanco and zosyn. He reports swelling is improving. Hospital course complicated with anemia and hemoglobin of 7.0 & s/p 1 unit of PRBC today.    Assessment/Plan:  Principal Problem:  Scrotal cellulitis/edema  - Concern for scrotal cellulitis on admission - Started empirically on IV Zosyn and vancomycin. Changed to oral doxycycline on 5/20. - His clinical picture is most likely complicated by significant anasarca. He received when necessary doses of IV Lasix with improvement in swelling. - Started on BID IV Lasix on 5/18: Reports improved diuresis but no adequate intake output charted (requested nursing for same). Will increase Lasix to 80 mg BID and resume home Aldactone 25 MG daily - May need 2-3 days of IV diuresis following which he may be discharged on Lasix 40 MG daily and will titrate Aldactone up to 50 or 100 MG daily. - Scrotal ultrasound: No testicular torsion and moderate sized right hydrocele >can follow-up with urology as outpatient.  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 - Status post 1 unit of PRBC on 5/17 for hemoglobin 7 g per DL. Hemoglobin has improved to 8 g range and stable - No reported bleeding or fevers. - Thrombocytopenia and leukopenia improving  History of alcohol abuse - Blood alcohol level less than 5. UDS positive for opiates - Continue folic acid and thiamine. - No overt withdrawal features.  Hypokalemia - Replace as needed and follow  Anasarca - Likely due to hypoalbuminemia, Cirrhosis and  nutritional - Most likely contributing to significant lower extremity edema extending to external genitalia - IV Lasix and oral Aldactone - Abdominal ultrasound: Small ascites and confirmed cirrhosis - 2-D echo: Normal EF - Still very volume overloaded.  Subtle low attenuation lesion in the pancreatic body/tail seen on CT abdomen in 2014 - Patient states that he is awaiting biopsy via PCP but unable to do secondary to thrombocytopenia. Outpatient follow-up.  Alcoholic Cirrhosis - As per spouse, patient sees a hematologist is in [facility] and was supposed to undergo? Liver biopsy but precluded by thrombocytopenia. - He apparently has significantly cut down on his alcohol intake.   Code Status: Full Family Communication: Discussed with patient's spouse via phone on 5/18 Disposition Plan: Home when medically stable-possibly in the next 3-4 days   Consultants:  None  Procedures:  None  Antibiotics:  IV vancomycin-discontinued  IV Zosyn-DC'd 5/19  PO Doxyxycline   Subjective: Urinating well. Lower extremity and scrotal edema gradually decreasing.  Objective: Filed Vitals:   07/31/14 1411 07/31/14 2024 08/01/14 0458 08/01/14 1346  BP: 96/65 104/60 95/58 93/57   Pulse: 82 87 84 90  Temp: 98.2 F (36.8 C) 98.5 F (36.9 C) 97.9 F (36.6 C) 98.6 F (37 C)  TempSrc: Oral Oral Oral Oral  Resp: 18 16 16 16   Height:      Weight:   89.903 kg (198 lb 3.2 oz)   SpO2: 100% 100% 98% 100%    Intake/Output Summary (Last 24 hours) at 08/01/14 1443 Last data filed at 08/01/14 1346  Gross per 24 hour  Intake   1240 ml  Output   2650 ml  Net  -1410 ml   Filed Weights   07/30/14 0458 07/31/14 0543 08/01/14 0458  Weight: 87.544 kg (193 lb) 89.858 kg (198 lb 1.6 oz) 89.903 kg (198 lb 3.2 oz)     Exam:  General exam: Pleasant middle-aged male lying comfortably in bed Respiratory system: Clear. No increased work of breathing. Cardiovascular system: S1 & S2 heard, RRR. No  JVD, murmurs, gallops, clicks. 2+ pitting bilateral leg edema extending to the external genitalia (scrotal edema, minimal patchy erythema of scrotal skin, penile edema) Gastrointestinal system: Abdomen is nondistended, soft and nontender. Normal bowel sounds heard. Central nervous system: Alert and oriented. No focal neurological deficits. Extremities: Symmetric 5 x 5 power.   Data Reviewed: Basic Metabolic Panel:  Recent Labs Lab 07/29/14 1757 07/30/14 0420 07/31/14 0350 08/01/14 0350  NA 135 133* 135 134*  K 4.8 3.3* 4.0 3.4*  CL 102 101 105 102  CO2 26 26 25 25   GLUCOSE 131* 142* 122* 106*  BUN 16 16 20 19   CREATININE 0.69 0.69 0.84 0.76  CALCIUM 8.5* 7.8* 8.1* 8.1*  MG 1.3*  --   --   --   PHOS 4.1  --   --   --    Liver Function Tests:  Recent Labs Lab 07/29/14 1757 07/30/14 0420  AST 71* 59*  ALT 27 21  ALKPHOS 114 92  BILITOT 2.2* 2.0*  PROT 7.5 6.2*  ALBUMIN 2.0* 1.7*   No results for input(s): LIPASE, AMYLASE in the last 168 hours. No results for input(s): AMMONIA in the last 168 hours. CBC:  Recent Labs Lab 07/29/14 1757 07/30/14 0420 07/31/14 0350 08/01/14 0350  WBC 3.6* 2.5* 2.7* 2.9*  NEUTROABS 2.4  --   --   --   HGB 8.1* 7.0* 8.1* 8.6*  HCT 22.6* 20.1* 23.8* 24.4*  MCV 105.6* 105.2* 102.1* 101.7*  PLT 104* 73* 62* 103*   Cardiac Enzymes: No results for input(s): CKTOTAL, CKMB, CKMBINDEX, TROPONINI in the last 168 hours. BNP (last 3 results) No results for input(s): PROBNP in the last 8760 hours. CBG:  Recent Labs Lab 07/30/14 0738 07/31/14 0724 08/01/14 0737  GLUCAP 97 113* 121*    Recent Results (from the past 240 hour(s))  Urine culture     Status: None   Collection Time: 07/29/14  9:52 PM  Result Value Ref Range Status   Specimen Description URINE, CLEAN CATCH  Final   Special Requests NONE  Final   Colony Count NO GROWTH Performed at [facility]   Final   Culture NO GROWTH Performed at [facility]    Final   Report Status 07/30/2014 FINAL  Final           Studies: US Abdomen Limited  07/31/2014   CLINICAL DATA:  Acute onset of upper abdominal pain. Known history of cirrhosis. Initial encounter.  EXAM: US ABDOMEN LIMITED - RIGHT UPPER QUADRANT  COMPARISON:  CT of the abdomen and pelvis performed 06/27/2014, MRI of the abdomen performed 07/01/2014, and abdominal ultrasound performed 06/04/2014  FINDINGS: Gallbladder:  No stones or sludge seen. The gallbladder wall is diffusely thickened, measuring 6 mm, of uncertain significance. This may be related to the patient's ascites. Trace nonspecific pericholecystic fluid is seen. No ultrasonographic Murphy's sign is elicited.  Common bile duct:  Diameter: 0.4 cm, within normal limits in caliber.  Liver:  No focal lesion identified. Within normal limits in parenchymal echogenicity. Known hepatic cirrhosis is only partially characterized, with minimal nodularity in hepatic contour.  A small amount of ascites is noted surrounding the liver.  IMPRESSION: 1. No definite acute abnormality seen to explain the patient's symptoms. 2. Diffuse gallbladder wall thickening may be related to the patient's ascites. Trace nonspecific pericholecystic fluid seen. No ultrasonographic Murphy's sign elicited. No stones or sludge seen. 3. Small amount of ascites noted surrounding the liver. 4. Known hepatic cirrhosis is only partially characterized on ultrasound.   Electronically Signed   By: Garald Balding M.D.   On: 07/31/2014 22:15        Scheduled Meds: . folic acid  1 mg Oral Daily  . furosemide  80 mg Intravenous BID  . piperacillin-tazobactam (ZOSYN)  IV  3.375 g Intravenous 3 times per day  . spironolactone  25 mg Oral Daily  . thiamine  100 mg Oral Daily  . vancomycin  1,250 mg Intravenous BID   Continuous Infusions:   Principal Problem:   Cellulitis Active Problems:   Scrotal swelling    Time spent: 30 minutes.    Vernell Leep, MD, FACP,  FHM. [facility] Pager [phone]  If 7PM-7AM, please contact night-coverage [email or web address] Password [email or web address] 08/01/2014, 2:43 PM    LOS: 3 days

## 2014-08-01 NOTE — Progress Notes (Signed)
  Echocardiogram 2D Echocardiogram has been performed.  [name] 08/01/2014, 9:36 AM

## 2014-08-02 LAB — BASIC METABOLIC PANEL
Anion gap: 8 (ref 5–15)
BUN: 20 mg/dL (ref 6–20)
CHLORIDE: 98 mmol/L — AB (ref 101–111)
CO2: 29 mmol/L (ref 22–32)
Calcium: 8.3 mg/dL — ABNORMAL LOW (ref 8.9–10.3)
Creatinine, Ser: 0.82 mg/dL (ref 0.61–1.24)
GFR calc non Af Amer: >60 mL/min (ref >60)
Glucose, Bld: 152 mg/dL — ABNORMAL HIGH (ref 65–99)
POTASSIUM: 3.2 mmol/L — AB (ref 3.5–5.1)
Sodium: 135 mmol/L (ref 135–145)

## 2014-08-02 LAB — CBC
HEMATOCRIT: 26.8 % — AB (ref 39.0–52.0)
Hemoglobin: 9.3 g/dL — ABNORMAL LOW (ref 13.0–17.0)
MCH: 35.4 pg — ABNORMAL HIGH (ref 26.0–34.0)
MCHC: 34.7 g/dL (ref 30.0–36.0)
MCV: 101.9 fL — ABNORMAL HIGH (ref 78.0–100.0)
Platelets: 101 10*3/uL — ABNORMAL LOW (ref 150–400)
RBC: 2.63 MIL/uL — AB (ref 4.22–5.81)
RDW: 20.6 % — ABNORMAL HIGH (ref 11.5–15.5)
WBC: 2.8 10*3/uL — AB (ref 4.0–10.5)

## 2014-08-02 LAB — GLUCOSE, CAPILLARY: Glucose-Capillary: 105 mg/dL — ABNORMAL HIGH (ref 65–99)

## 2014-08-02 MED ORDER — POTASSIUM CHLORIDE CRYS ER 20 MEQ PO TBCR
40.0000 meq | EXTENDED_RELEASE_TABLET | Freq: Two times a day (BID) | ORAL | Status: DC
  Administered 2014-08-02 – 2014-08-05 (×7): 40 meq via ORAL
  Filled 2014-08-02 (×8): qty 2

## 2014-08-02 MED ORDER — SPIRONOLACTONE 50 MG PO TABS
50.0000 mg | ORAL_TABLET | Freq: Every day | ORAL | Status: DC
  Administered 2014-08-03 – 2014-08-05 (×3): 50 mg via ORAL
  Filled 2014-08-02 (×3): qty 1

## 2014-08-02 NOTE — Progress Notes (Signed)
PROGRESS NOTE    [name] NTZ:[id number] DOB: [birth year] DOA: 07/29/2014 PCP: Woody Seller, MD  HPI/Brief narrative 59 year old male with past medical history of alcohol use, pancreatitis who presented from GU office (referred by [name]) because patient had worsening scrotal swelling and concern for cellulitis.   On admission, patient was started on broad spectrum antibiotics, vanco and zosyn. He reports swelling is improving. Hospital course complicated with anemia and hemoglobin of 7.0 & s/p 1 unit of PRBC today.    Assessment/Plan:  Principal Problem:  Scrotal cellulitis/edema  - Concern for scrotal cellulitis on admission - Started empirically on IV Zosyn and vancomycin. Changed to oral doxycycline on 5/20. - His clinical picture is most likely complicated by significant anasarca. He received when necessary doses of IV Lasix with improvement in swelling. - Started on BID IV Lasix on 5/18: Reports improved diuresis but no adequate intake output charted (requested nursing for same). Will increase Lasix to 80 mg BID and resume home Aldactone 25 MG daily - May need 2-3 days of IV diuresis following which he may be discharged on Lasix 40 MG daily and will titrate Aldactone up to 50 or 100 MG daily. - Scrotal ultrasound: No testicular torsion and moderate sized right hydrocele >can follow-up with urology as outpatient.  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 - Status post 1 unit of PRBC on 5/17 for hemoglobin 7 g per DL. Hemoglobin has improved to 8 g range and stable - No reported bleeding or fevers. - Thrombocytopenia and leukopenia improving  History of alcohol abuse - Blood alcohol level less than 5. UDS positive for opiates - Continue folic acid and thiamine. - No overt withdrawal features.  Hypokalemia - Replace as needed and follow  Anasarca - Likely due to hypoalbuminemia, Cirrhosis and  nutritional - Most likely contributing to significant lower extremity edema extending to external genitalia - IV Lasix and oral Aldactone - Abdominal ultrasound: Small ascites and confirmed cirrhosis - 2-D echo: Normal EF - Still very volume overloaded. - -4.1 L since admission  Subtle low attenuation lesion in the pancreatic body/tail seen on CT abdomen in 2014 - Patient states that he is awaiting biopsy via PCP but unable to do secondary to thrombocytopenia. Outpatient follow-up.  Alcoholic Cirrhosis - As per spouse, patient sees a hematologist is in [facility] and was supposed to undergo? Liver biopsy but precluded by thrombocytopenia. - He apparently has significantly cut down on his alcohol intake.   Code Status: Full Family Communication: Discussed with patient's spouse via phone on 5/18 Disposition Plan: Home when medically stable-possibly in the next 2-3 days   Consultants:  None  Procedures:  None  Antibiotics:  IV vancomycin-discontinued  IV Zosyn-DC'd 5/19  PO Doxyxycline 5/19 >  Subjective: Urinating well. Lower extremity and scrotal edema gradually decreasing.  Objective: Filed Vitals:   08/01/14 1957 08/02/14 0600 08/02/14 0612 08/02/14 1418  BP: 104/73  91/60 105/67  Pulse: 91  83 84  Temp: 98.3 F (36.8 C)  97.9 F (36.6 C) 98.6 F (37 C)  TempSrc: Oral  Oral Oral  Resp: 16  18 16   Height:      Weight:  89.585 kg (197 lb 8 oz)    SpO2: 100%  95% 99%    Intake/Output Summary (Last 24 hours) at 08/02/14 1651 Last data filed at 08/02/14 1418  Gross per 24 hour  Intake    840 ml  Output  4075 ml  Net  -3235 ml   Filed Weights   07/31/14 0543 08/01/14 0458 08/02/14 0600  Weight: 89.858 kg (198 lb 1.6 oz) 89.903 kg (198 lb 3.2 oz) 89.585 kg (197 lb 8 oz)     Exam:  General exam: Pleasant middle-aged male lying comfortably in bed Respiratory system: Clear. No increased work of breathing. Cardiovascular system: S1 & S2 heard, RRR. No  JVD, murmurs, gallops, clicks. 2+ pitting bilateral leg edema extending to the external genitalia (scrotal edema, minimal patchy erythema of scrotal skin, penile edema)- starting to decrease Gastrointestinal system: Abdomen is nondistended, soft and nontender. Normal bowel sounds heard. Central nervous system: Alert and oriented. No focal neurological deficits. Extremities: Symmetric 5 x 5 power.   Data Reviewed: Basic Metabolic Panel:  Recent Labs Lab 07/29/14 1757 07/30/14 0420 07/31/14 0350 08/01/14 0350 08/02/14 0812  NA 135 133* 135 134* 135  K 4.8 3.3* 4.0 3.4* 3.2*  CL 102 101 105 102 98*  CO2 26 26 25 25 29   GLUCOSE 131* 142* 122* 106* 152*  BUN 16 16 20 19 20   CREATININE 0.69 0.69 0.84 0.76 0.82  CALCIUM 8.5* 7.8* 8.1* 8.1* 8.3*  MG 1.3*  --   --   --   --   PHOS 4.1  --   --   --   --    Liver Function Tests:  Recent Labs Lab 07/29/14 1757 07/30/14 0420  AST 71* 59*  ALT 27 21  ALKPHOS 114 92  BILITOT 2.2* 2.0*  PROT 7.5 6.2*  ALBUMIN 2.0* 1.7*   No results for input(s): LIPASE, AMYLASE in the last 168 hours. No results for input(s): AMMONIA in the last 168 hours. CBC:  Recent Labs Lab 07/29/14 1757 07/30/14 0420 07/31/14 0350 08/01/14 0350 08/02/14 0812  WBC 3.6* 2.5* 2.7* 2.9* 2.8*  NEUTROABS 2.4  --   --   --   --   HGB 8.1* 7.0* 8.1* 8.6* 9.3*  HCT 22.6* 20.1* 23.8* 24.4* 26.8*  MCV 105.6* 105.2* 102.1* 101.7* 101.9*  PLT 104* 73* 62* 103* 101*   Cardiac Enzymes: No results for input(s): CKTOTAL, CKMB, CKMBINDEX, TROPONINI in the last 168 hours. BNP (last 3 results) No results for input(s): PROBNP in the last 8760 hours. CBG:  Recent Labs Lab 07/30/14 0738 07/31/14 0724 08/01/14 0737 08/02/14 0748  GLUCAP 97 113* 121* 105*    Recent Results (from the past 240 hour(s))  Urine culture     Status: None   Collection Time: 07/29/14  9:52 PM  Result Value Ref Range Status   Specimen Description URINE, CLEAN CATCH  Final   Special  Requests NONE  Final   Colony Count NO GROWTH Performed at [facility]   Final   Culture NO GROWTH Performed at [facility]   Final   Report Status 07/30/2014 FINAL  Final           Studies: US Abdomen Limited  07/31/2014   CLINICAL DATA:  Acute onset of upper abdominal pain. Known history of cirrhosis. Initial encounter.  EXAM: US ABDOMEN LIMITED - RIGHT UPPER QUADRANT  COMPARISON:  CT of the abdomen and pelvis performed 06/27/2014, MRI of the abdomen performed 07/01/2014, and abdominal ultrasound performed 06/04/2014  FINDINGS: Gallbladder:  No stones or sludge seen. The gallbladder wall is diffusely thickened, measuring 6 mm, of uncertain significance. This may be related to the patient's ascites. Trace nonspecific pericholecystic fluid is seen. No ultrasonographic Murphy's sign is elicited.  Common  bile duct:  Diameter: 0.4 cm, within normal limits in caliber.  Liver:  No focal lesion identified. Within normal limits in parenchymal echogenicity. Known hepatic cirrhosis is only partially characterized, with minimal nodularity in hepatic contour. A small amount of ascites is noted surrounding the liver.  IMPRESSION: 1. No definite acute abnormality seen to explain the patient's symptoms. 2. Diffuse gallbladder wall thickening may be related to the patient's ascites. Trace nonspecific pericholecystic fluid seen. No ultrasonographic Murphy's sign elicited. No stones or sludge seen. 3. Small amount of ascites noted surrounding the liver. 4. Known hepatic cirrhosis is only partially characterized on ultrasound.   Electronically Signed   By: Garald Balding M.D.   On: 07/31/2014 22:15        Scheduled Meds: . doxycycline  100 mg Oral BID  . folic acid  1 mg Oral Daily  . furosemide  80 mg Intravenous BID  . potassium chloride  40 mEq Oral BID  . [START ON 08/03/2014] spironolactone  50 mg Oral Daily  . thiamine  100 mg Oral Daily   Continuous Infusions:   Principal  Problem:   Cellulitis Active Problems:   Scrotal swelling    Time spent: 30 minutes.    Vernell Leep, MD, FACP, FHM. [facility] Pager [phone]  If 7PM-7AM, please contact night-coverage [email or web address] Password [email or web address] 08/02/2014, 4:51 PM    LOS: 4 days

## 2014-08-02 NOTE — Progress Notes (Signed)
Date:  Aug 02, 2014 U.R. performed for needs and level of care. Will continue to follow for Case Management needs.  Velva Harman, RN, BSN, [place]   [phone]

## 2014-08-03 LAB — CBC
HEMATOCRIT: 24.5 % — AB (ref 39.0–52.0)
Hemoglobin: 8.4 g/dL — ABNORMAL LOW (ref 13.0–17.0)
MCH: 35.4 pg — ABNORMAL HIGH (ref 26.0–34.0)
MCHC: 34.3 g/dL (ref 30.0–36.0)
MCV: 103.4 fL — ABNORMAL HIGH (ref 78.0–100.0)
Platelets: 87 10*3/uL — ABNORMAL LOW (ref 150–400)
RBC: 2.37 MIL/uL — AB (ref 4.22–5.81)
RDW: 20.3 % — ABNORMAL HIGH (ref 11.5–15.5)
WBC: 2.5 10*3/uL — ABNORMAL LOW (ref 4.0–10.5)

## 2014-08-03 LAB — BASIC METABOLIC PANEL
ANION GAP: 7 (ref 5–15)
BUN: 20 mg/dL (ref 6–20)
CHLORIDE: 98 mmol/L — AB (ref 101–111)
CO2: 30 mmol/L (ref 22–32)
CREATININE: 0.86 mg/dL (ref 0.61–1.24)
Calcium: 8.2 mg/dL — ABNORMAL LOW (ref 8.9–10.3)
GFR calc Af Amer: >60 mL/min (ref >60)
GLUCOSE: 129 mg/dL — AB (ref 65–99)
Potassium: 3.5 mmol/L (ref 3.5–5.1)
Sodium: 135 mmol/L (ref 135–145)

## 2014-08-03 LAB — GLUCOSE, CAPILLARY: Glucose-Capillary: 96 mg/dL (ref 65–99)

## 2014-08-03 NOTE — Progress Notes (Signed)
PROGRESS NOTE    [name] KDX:[id number] DOB: [birth year] DOA: 07/29/2014 PCP: Woody Seller, MD  HPI/Brief narrative 59 year old male with past medical history of alcohol use, pancreatitis who presented from GU office (referred by [name]) because patient had worsening scrotal swelling and concern for cellulitis.   On admission, patient was started on broad spectrum antibiotics, vanco and zosyn. He reports swelling is improving. Hospital course complicated with anemia and hemoglobin of 7.0 & s/p 1 unit of PRBC today.    Assessment/Plan:  Principal Problem:  Scrotal cellulitis/edema  - Initially treated with IV vancomycin and Zosyn. - Changed to oral doxycycline on 5/20 - His findings are more of scrotal edema rather than cellulitis at this time - Clinically improved - Scrotal ultrasound: No testicular torsion and moderate sized right hydrocele >can follow-up with urology as outpatient.  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 - Status post 1 unit of PRBC on 5/17 for hemoglobin 7 g per DL. Hemoglobin has improved to 8 g range and stable - No reported bleeding or fevers. - Thrombocytopenia and leukopenia improving/stable  History of alcohol abuse - Blood alcohol level less than 5. UDS positive for opiates - Continue folic acid and thiamine. - No overt withdrawal features.  Hypokalemia - Replace as needed and follow  Anasarca - Likely due to hypoalbuminemia, Cirrhosis and nutritional - Most likely contributing to significant lower extremity edema extending to external genitalia - IV Lasix and oral Aldactone - Abdominal ultrasound: Small ascites and confirmed cirrhosis - 2-D echo: Normal EF - Still very volume overloaded. - -4.9 L since admission  Subtle low attenuation lesion in the pancreatic body/tail seen on CT abdomen in 2014 - Patient states that he is awaiting biopsy via PCP but unable to  do secondary to thrombocytopenia. Outpatient follow-up.  Alcoholic Cirrhosis - As per spouse, patient sees a hematologist is in [facility] and was supposed to undergo? Liver biopsy but precluded by thrombocytopenia. - He apparently has significantly cut down on his alcohol intake.  DVT prophylaxis: SCDs. Not on heparin due to mild coagulopathy/INR 1.5 Code Status: Full Family Communication: Discussed with patient's spouse via phone on 5/18 Disposition Plan: Home when medically stable-possibly in the next 2 days   Consultants:  None  Procedures:  None  Antibiotics:  IV vancomycin-discontinued  IV Zosyn-DC'd 5/19  PO Doxyxycline 5/19 >  Subjective: Lower extremity and scrotal edema gradually decreasing. States that urine output may be decreasing.  Objective: Filed Vitals:   08/02/14 0612 08/02/14 1418 08/02/14 2247 08/03/14 0515  BP: 91/60 105/67 98/64 96/62   Pulse: 83 84 79 84  Temp: 97.9 F (36.6 C) 98.6 F (37 C) 97.5 F (36.4 C) 97.2 F (36.2 C)  TempSrc: Oral Oral Oral Oral  Resp: 18 16 16 16   Height:      Weight:    85.548 kg (188 lb 9.6 oz)  SpO2: 95% 99% 98% 95%    Intake/Output Summary (Last 24 hours) at 08/03/14 1139 Last data filed at 08/03/14 0941  Gross per 24 hour  Intake   1080 ml  Output   3250 ml  Net  -2170 ml   Filed Weights   08/01/14 0458 08/02/14 0600 08/03/14 0515  Weight: 89.903 kg (198 lb 3.2 oz) 89.585 kg (197 lb 8 oz) 85.548 kg (188 lb 9.6 oz)     Exam:  General exam: Pleasant middle-aged male lying comfortably in bed Respiratory system: Clear. No  increased work of breathing. Cardiovascular system: S1 & S2 heard, RRR. No JVD, murmurs, gallops, clicks. 1+ pitting bilateral leg edema extending to the external genitalia (scrotal edema, minimal patchy erythema of scrotal skin, penile edema)- gradually decreasing Gastrointestinal system: Abdomen is nondistended, soft and nontender. Normal bowel sounds heard. Central nervous  system: Alert and oriented. No focal neurological deficits. Extremities: Symmetric 5 x 5 power.   Data Reviewed: Basic Metabolic Panel:  Recent Labs Lab 07/29/14 1757 07/30/14 0420 07/31/14 0350 08/01/14 0350 08/02/14 0812 08/03/14 0422  NA 135 133* 135 134* 135 135  K 4.8 3.3* 4.0 3.4* 3.2* 3.5  CL 102 101 105 102 98* 98*  CO2 26 26 25 25 29 30   GLUCOSE 131* 142* 122* 106* 152* 129*  BUN 16 16 20 19 20 20   CREATININE 0.69 0.69 0.84 0.76 0.82 0.86  CALCIUM 8.5* 7.8* 8.1* 8.1* 8.3* 8.2*  MG 1.3*  --   --   --   --   --   PHOS 4.1  --   --   --   --   --    Liver Function Tests:  Recent Labs Lab 07/29/14 1757 07/30/14 0420  AST 71* 59*  ALT 27 21  ALKPHOS 114 92  BILITOT 2.2* 2.0*  PROT 7.5 6.2*  ALBUMIN 2.0* 1.7*   No results for input(s): LIPASE, AMYLASE in the last 168 hours. No results for input(s): AMMONIA in the last 168 hours. CBC:  Recent Labs Lab 07/29/14 1757 07/30/14 0420 07/31/14 0350 08/01/14 0350 08/02/14 0812 08/03/14 0422  WBC 3.6* 2.5* 2.7* 2.9* 2.8* 2.5*  NEUTROABS 2.4  --   --   --   --   --   HGB 8.1* 7.0* 8.1* 8.6* 9.3* 8.4*  HCT 22.6* 20.1* 23.8* 24.4* 26.8* 24.5*  MCV 105.6* 105.2* 102.1* 101.7* 101.9* 103.4*  PLT 104* 73* 62* 103* 101* 87*   Cardiac Enzymes: No results for input(s): CKTOTAL, CKMB, CKMBINDEX, TROPONINI in the last 168 hours. BNP (last 3 results) No results for input(s): PROBNP in the last 8760 hours. CBG:  Recent Labs Lab 07/30/14 0738 07/31/14 0724 08/01/14 0737 08/02/14 0748 08/03/14 0743  GLUCAP 97 113* 121* 105* 96    Recent Results (from the past 240 hour(s))  Urine culture     Status: None   Collection Time: 07/29/14  9:52 PM  Result Value Ref Range Status   Specimen Description URINE, CLEAN CATCH  Final   Special Requests NONE  Final   Colony Count NO GROWTH Performed at [facility]   Final   Culture NO GROWTH Performed at [facility]   Final   Report Status 07/30/2014  FINAL  Final           Studies: No results found.      Scheduled Meds: . doxycycline  100 mg Oral BID  . folic acid  1 mg Oral Daily  . furosemide  80 mg Intravenous BID  . potassium chloride  40 mEq Oral BID  . spironolactone  50 mg Oral Daily  . thiamine  100 mg Oral Daily   Continuous Infusions:   Principal Problem:   Cellulitis Active Problems:   Scrotal swelling    Time spent: 20 minutes.    Vernell Leep, MD, FACP, FHM. [facility] Pager [phone]  If 7PM-7AM, please contact night-coverage [email or web address] Password [email or web address] 08/03/2014, 11:39 AM    LOS: 5 days

## 2014-08-04 LAB — BASIC METABOLIC PANEL
Anion gap: 5 (ref 5–15)
BUN: 19 mg/dL (ref 6–20)
CALCIUM: 8.7 mg/dL — AB (ref 8.9–10.3)
CHLORIDE: 99 mmol/L — AB (ref 101–111)
CO2: 33 mmol/L — ABNORMAL HIGH (ref 22–32)
Creatinine, Ser: 0.65 mg/dL (ref 0.61–1.24)
GFR calc Af Amer: >60 mL/min (ref >60)
GFR calc non Af Amer: >60 mL/min (ref >60)
Glucose, Bld: 92 mg/dL (ref 65–99)
POTASSIUM: 4.3 mmol/L (ref 3.5–5.1)
SODIUM: 137 mmol/L (ref 135–145)

## 2014-08-04 LAB — CBC
HCT: 26.4 % — ABNORMAL LOW (ref 39.0–52.0)
Hemoglobin: 9.1 g/dL — ABNORMAL LOW (ref 13.0–17.0)
MCH: 35.4 pg — ABNORMAL HIGH (ref 26.0–34.0)
MCHC: 34.5 g/dL (ref 30.0–36.0)
MCV: 102.7 fL — ABNORMAL HIGH (ref 78.0–100.0)
Platelets: 117 K/uL — ABNORMAL LOW (ref 150–400)
RBC: 2.57 MIL/uL — ABNORMAL LOW (ref 4.22–5.81)
RDW: 19.9 % — ABNORMAL HIGH (ref 11.5–15.5)
WBC: 3.2 K/uL — ABNORMAL LOW (ref 4.0–10.5)

## 2014-08-04 LAB — GLUCOSE, CAPILLARY: Glucose-Capillary: 89 mg/dL (ref 65–99)

## 2014-08-04 MED ORDER — HYDROCODONE-ACETAMINOPHEN 5-325 MG PO TABS
1.0000 | ORAL_TABLET | Freq: Four times a day (QID) | ORAL | Status: DC | PRN
  Administered 2014-08-04 – 2014-08-05 (×3): 1 via ORAL
  Filled 2014-08-04 (×3): qty 1

## 2014-08-04 NOTE — Progress Notes (Signed)
PROGRESS NOTE    [name] XNT:[id number] DOB: 1955-04-29 DOA: 07/29/2014 PCP: Woody Seller, MD  HPI/Brief narrative 59 year old male with past medical history of alcohol use, pancreatitis who presented from [facility] office (referred by [name]) because patient had worsening scrotal swelling and concern for cellulitis.   On admission, patient was started on broad spectrum antibiotics, vanco and zosyn. He reports swelling is improving. Hospital course complicated with anemia and hemoglobin of 7.0 & s/p 1 unit of PRBC today.    Assessment/Plan:  Principal Problem:  Scrotal cellulitis/edema/moderate sized right hydrocele  - Initially treated with IV vancomycin and Zosyn. - Changed to oral doxycycline on 5/20 - His findings are more of scrotal edema rather than cellulitis at this time - Clinically improved - Scrotal ultrasound: No testicular torsion and moderate sized right hydrocele >can follow-up with urology as outpatient. - Scrotal/penile edema continues to improve. Continue additional day of IV diuretics then changed to by mouth - Patient follow-up with urology regarding definitive/? 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 - Status post 1 unit of PRBC on 5/17 for hemoglobin 7 g per DL. Hemoglobin has improved to 8 g range and stable - No reported bleeding or fevers. - Thrombocytopenia and leukopenia improving/stable  History of alcohol abuse - Blood alcohol level less than 5. UDS positive for opiates - Continue folic acid and thiamine. - No overt withdrawal features.  Hypokalemia - Replace as needed and follow  Anasarca - Likely due to hypoalbuminemia, Cirrhosis and nutritional - Most likely contributing to significant lower extremity edema extending to external genitalia - IV Lasix and oral Aldactone - Abdominal ultrasound: Small ascites and confirmed  cirrhosis - 2-D echo: Normal EF - Still very volume overloaded. - - 8.897 L since admission  Subtle low attenuation lesion in the pancreatic body/tail seen on CT abdomen in 2014 - Patient states that he is awaiting biopsy via PCP but unable to do secondary to thrombocytopenia. Outpatient follow-up.  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 Liver biopsy but precluded by thrombocytopenia. - He apparently has significantly cut down on his alcohol intake.  DVT prophylaxis: SCDs. Not on heparin due to mild coagulopathy/INR 1.5 Code Status: Full Family Communication: Discussed with patient's spouse at bedside on 5/22 Disposition Plan: Home when medically stable-possibly on 5/23   Consultants:  None  Procedures:  None  Antibiotics:  IV vancomycin-discontinued  IV Zosyn-DC'd 5/19  PO Doxyxycline 5/19 >  Subjective: Lower extremity and scrotal edema gradually decreasing.   Objective: Filed Vitals:   08/03/14 1456 08/03/14 2010 08/04/14 0438 08/04/14 0740  BP: 94/55 112/73 102/64   Pulse: 84 83 80   Temp: 98.3 F (36.8 C) 98.9 F (37.2 C) 98.1 F (36.7 C)   TempSrc: Oral Oral Oral   Resp: 16 16 16    Height:      Weight:    83.008 kg (183 lb)  SpO2: 95% 95% 95%     Intake/Output Summary (Last 24 hours) at 08/04/14 1217 Last data filed at 08/04/14 1055  Gross per 24 hour  Intake    240 ml  Output   4425 ml  Net  -4185 ml   Filed Weights   08/02/14 0600 08/03/14 0515 08/04/14 0740  Weight: 89.585 kg (197 lb 8 oz) 85.548 kg (188 lb 9.6 oz) 83.008 kg (183 lb)  Exam:  General exam: Pleasant middle-aged male lying comfortably in bed Respiratory system: Clear. No increased work of breathing. Cardiovascular system: S1 & S2 heard, RRR. No JVD, murmurs, gallops, clicks. Trace bilateral leg edema -significantly improved compared to a few days ago. Scrotal and penile edema decreasing but a lot of  the scrotal swelling may be secondary to right hydrocele. No features of cellulitis at this time. Gastrointestinal system: Abdomen is nondistended, soft and nontender. Normal bowel sounds heard. Central nervous system: Alert and oriented. No focal neurological deficits. Extremities: Symmetric 5 x 5 power.   Data Reviewed: Basic Metabolic Panel:  Recent Labs Lab 07/29/14 1757  07/31/14 0350 08/01/14 0350 08/02/14 0812 08/03/14 0422 08/04/14 0403  NA 135  < > 135 134* 135 135 137  K 4.8  < > 4.0 3.4* 3.2* 3.5 4.3  CL 102  < > 105 102 98* 98* 99*  CO2 26  < > 25 25 29 30  33*  GLUCOSE 131*  < > 122* 106* 152* 129* 92  BUN 16  < > 20 19 20 20 19   CREATININE 0.69  < > 0.84 0.76 0.82 0.86 0.65  CALCIUM 8.5*  < > 8.1* 8.1* 8.3* 8.2* 8.7*  MG 1.3*  --   --   --   --   --   --   PHOS 4.1  --   --   --   --   --   --   < > = values in this interval not displayed. Liver Function Tests:  Recent Labs Lab 07/29/14 1757 07/30/14 0420  AST 71* 59*  ALT 27 21  ALKPHOS 114 92  BILITOT 2.2* 2.0*  PROT 7.5 6.2*  ALBUMIN 2.0* 1.7*   No results for input(s): LIPASE, AMYLASE in the last 168 hours. No results for input(s): AMMONIA in the last 168 hours. CBC:  Recent Labs Lab 07/29/14 1757  07/31/14 0350 08/01/14 0350 08/02/14 0812 08/03/14 0422 08/04/14 0403  WBC 3.6*  < > 2.7* 2.9* 2.8* 2.5* 3.2*  NEUTROABS 2.4  --   --   --   --   --   --   HGB 8.1*  < > 8.1* 8.6* 9.3* 8.4* 9.1*  HCT 22.6*  < > 23.8* 24.4* 26.8* 24.5* 26.4*  MCV 105.6*  < > 102.1* 101.7* 101.9* 103.4* 102.7*  PLT 104*  < > 62* 103* 101* 87* 117*  < > = values in this interval not displayed. Cardiac Enzymes: No results for input(s): CKTOTAL, CKMB, CKMBINDEX, TROPONINI in the last 168 hours. BNP (last 3 results) No results for input(s): PROBNP in the last 8760 hours. CBG:  Recent Labs Lab 07/31/14 0724 08/01/14 0737 08/02/14 0748 08/03/14 0743 08/04/14 0754  GLUCAP 113* 121* 105* 96 89    Recent  Results (from the past 240 hour(s))  Urine culture     Status: None   Collection Time: 07/29/14  9:52 PM  Result Value Ref Range Status   Specimen Description URINE, CLEAN CATCH  Final   Special Requests NONE  Final   Colony Count NO GROWTH Performed at [facility]   Final   Culture NO GROWTH Performed at [facility]   Final   Report Status 07/30/2014 FINAL  Final           Studies: No results found.      Scheduled Meds: . doxycycline  100 mg Oral BID  . folic acid  1 mg Oral Daily  . furosemide  80 mg Intravenous BID  . potassium chloride  40 mEq Oral BID  . spironolactone  50 mg Oral Daily  . thiamine  100 mg Oral Daily   Continuous Infusions:   Principal Problem:   Cellulitis Active Problems:   Scrotal swelling    Time spent: 20 minutes.    Vernell Leep, MD, FACP, FHM. [facility] Pager [phone]  If 7PM-7AM, please contact night-coverage [email or web address] Password [facility] 08/04/2014, 12:17 PM    LOS: 6 days

## 2014-08-05 DIAGNOSIS — N492 Inflammatory disorders of scrotum: Principal | ICD-10-CM

## 2014-08-05 LAB — BASIC METABOLIC PANEL
Anion gap: 6 (ref 5–15)
BUN: 17 mg/dL (ref 6–20)
CO2: 33 mmol/L — ABNORMAL HIGH (ref 22–32)
CREATININE: 0.77 mg/dL (ref 0.61–1.24)
Calcium: 8.6 mg/dL — ABNORMAL LOW (ref 8.9–10.3)
Chloride: 97 mmol/L — ABNORMAL LOW (ref 101–111)
GFR calc Af Amer: >60 mL/min (ref >60)
GFR calc non Af Amer: >60 mL/min (ref >60)
Glucose, Bld: 132 mg/dL — ABNORMAL HIGH (ref 65–99)
Potassium: 3.9 mmol/L (ref 3.5–5.1)
Sodium: 136 mmol/L (ref 135–145)

## 2014-08-05 LAB — GLUCOSE, CAPILLARY: GLUCOSE-CAPILLARY: 130 mg/dL — AB (ref 65–99)

## 2014-08-05 MED ORDER — SPIRONOLACTONE 25 MG PO TABS: 50.0000 mg | ORAL_TABLET | Freq: Every day | ORAL | Status: DC

## 2014-08-05 MED ORDER — FUROSEMIDE 80 MG PO TABS: 80.0000 mg | ORAL_TABLET | Freq: Every day | ORAL | Status: DC

## 2014-08-05 MED ORDER — FOLIC ACID 1 MG PO TABS: 1.0000 mg | ORAL_TABLET | Freq: Every day | ORAL | Status: DC

## 2014-08-05 MED ORDER — ACETAMINOPHEN 325 MG PO TABS: 650.0000 mg | ORAL_TABLET | Freq: Three times a day (TID) | ORAL | Status: DC | PRN

## 2014-08-05 MED ORDER — THIAMINE HCL 100 MG PO TABS: 100.0000 mg | ORAL_TABLET | Freq: Every day | ORAL | Status: DC

## 2014-08-05 MED ORDER — FUROSEMIDE 80 MG PO TABS
80.0000 mg | ORAL_TABLET | Freq: Every day | ORAL | Status: DC
  Administered 2014-08-05: 80 mg via ORAL
  Filled 2014-08-05: qty 1

## 2014-08-05 NOTE — Care Management Note (Signed)
Case Management Note  Patient Details  Name: [name] MRN: [id number] Date of Birth: Feb 17, 1956  Subjective/Objective:                    Action/Plan:d/c home no needs or orders.   Expected Discharge Date:                  Expected Discharge Plan:     In-House Referral:     Discharge planning Services     Post Acute Care Choice:    Choice offered to:     DME Arranged:    DME Agency:     HH Arranged:    [place] Agency:     Status of Service:  Completed, signed off  Medicare Important Message Given:    Date Medicare IM Given:    Medicare IM give by:    Date Additional Medicare IM Given:    Additional Medicare Important Message give by:     If discussed at [place] of Stay Meetings, dates discussed:    Additional Comments:  Dessa Phi, RN 08/05/2014, 12:04 PM

## 2014-08-05 NOTE — Discharge Instructions (Signed)
Cellulitis Cellulitis is an infection of the skin and the tissue beneath it. The infected area is usually red and tender. Cellulitis occurs most often in the arms and lower legs.  CAUSES  Cellulitis is caused by bacteria that enter the skin through cracks or cuts in the skin. The most common types of bacteria that cause cellulitis are staphylococci and streptococci. SIGNS AND SYMPTOMS   Redness and warmth.  Swelling.  Tenderness or pain.  Fever. DIAGNOSIS  Your health care provider can usually determine what is wrong based on a physical exam. Blood tests may also be done. TREATMENT  Treatment usually involves taking an antibiotic medicine. HOME CARE INSTRUCTIONS   Take your antibiotic medicine as directed by your health care provider. Finish the antibiotic even if you start to feel better.  Keep the infected arm or leg elevated to reduce swelling.  Apply a warm cloth to the affected area up to 4 times per day to relieve pain.  Take medicines only as directed by your health care provider.  Keep all follow-up visits as directed by your health care provider. SEEK MEDICAL CARE IF:   You notice red streaks coming from the infected area.  Your red area gets larger or turns dark in color.  Your bone or joint underneath the infected area becomes painful after the skin has healed.  Your infection returns in the same area or another area.  You notice a swollen bump in the infected area.  You develop new symptoms.  You have a fever. SEEK IMMEDIATE MEDICAL CARE IF:   You feel very sleepy.  You develop vomiting or diarrhea.  You have a general ill feeling (malaise) with muscle aches and pains. MAKE SURE YOU:   Understand these instructions.  Will watch your condition.  Will get help right away if you are not doing well or get worse. Document Released: 12/09/2004 Document Revised: 07/16/2013 Document Reviewed: 05/17/2011 [facility] Patient Information 2015 [place], [place].  This information is not intended to replace advice given to you by your health care provider. Make sure you discuss any questions you have with your health care provider.  Cirrhosis Cirrhosis is a condition of scarring of the liver which is caused when the liver has tried repairing itself following damage. This damage may come from a previous infection such as one of the forms of hepatitis (usually hepatitis C), or the damage may come from being injured by toxins. The main toxin that causes this damage is alcohol. The scarring of the liver from use of alcohol is irreversible. That means the liver cannot return to normal even though alcohol is not used any more. The main danger of hepatitis C infection is that it may cause long-lasting (chronic) liver disease, and this also may lead to cirrhosis. This complication is progressive and irreversible. CAUSES  Prior to available blood tests, hepatitis C could be contracted by blood transfusions. Since testing of blood has improved, this is now unlikely. This infection can also be contracted through intravenous drug use and the sharing of needles. It can also be contracted through sexual relationships. The injury caused by alcohol comes from too much use. It is not a few drinks that poison the liver, but years of misuse. Usually there will be some signs and symptoms early with scarring of the liver that suggest the development of better habits. Alcohol should never be used while using acetaminophen. A small dose of both taken together may cause irreversible damage to the liver. HOME CARE INSTRUCTIONS  There is no specific treatment for cirrhosis. However, there are things you can do to avoid making the condition worse.  Rest as needed.  Eat a well-balanced diet. Your caregiver can help you with suggestions.  Vitamin supplements including vitamins A, K, D, and thiamine can help.  A low-salt diet, water restriction, or diuretic medicine may be needed to reduce  fluid retention.  Avoid alcohol. This can be extremely toxic if combined with acetaminophen.  Avoid drugs which are toxic to the liver. Some of these include isoniazid, methyldopa, acetaminophen, anabolic steroids (muscle-building drugs), erythromycin, and oral contraceptives (birth control pills). Check with your caregiver to make sure medicines you are presently taking will not be harmful.  Periodic blood tests may be required. Follow your caregiver's advice regarding the timing of these.  Milk thistle is an herbal remedy which does protect the liver against toxins. However, it will not help once the liver has been scarred. SEEK MEDICAL CARE IF:  You have increasing fatigue or weakness.  You develop swelling of the hands, feet, legs, or face.  You vomit bright red blood, or a coffee ground appearing material.  You have blood in your stools, or the stools turn black and tarry.  You have a fever.  You develop loss of appetite, or have nausea and vomiting.  You develop jaundice.  You develop easy bruising or bleeding.  You have worsening of any of the problems you are concerned about. Document Released: 03/01/2005 Document Revised: 05/24/2011 Document Reviewed: 10/18/2007 [facility] Patient Information 2015 [place], [place]. This information is not intended to replace advice given to you by your health care provider. Make sure you discuss any questions you have with your health care provider.

## 2014-08-05 NOTE — Discharge Summary (Signed)
Physician Discharge Summary  [place] [name] XMI:[id number] DOB: [birth year] DOA: 07/29/2014  PCP: Woody Seller, MD  Admit date: 07/29/2014 Discharge date: 08/05/2014  Time spent: Greater than 30 minutes  Recommendations for Outpatient Follow-up:  1. Dr. Kathryne Eriksson, PCP in 3-5 days with repeat labs (CBC & CMP). 2. [name], Urology: For evaluation and management of scrotal swelling/right hydrocele 3. Dr. Dustin Folks, Hematology/oncology: Patient advised to keep prior appointments for follow-up  Discharge Diagnoses:  Principal Problem:   Cellulitis Active Problems:   Scrotal swelling   Discharge Condition: Improved & Stable  Diet recommendation: Heart healthy diet. Fluid restriction of 1200 ML's per day  [facility] Weights   08/03/14 0515 08/04/14 0740 08/05/14 0510  Weight: 85.548 kg (188 lb 9.6 oz) 83.008 kg (183 lb) 78.382 kg (172 lb 12.8 oz)    History of present illness:  59 year old male with past medical history of alcohol use, pancreatitis who presented from GU office (referred by [name]) because patient had worsening scrotal swelling and concern for cellulitis.   On admission, patient was started on broad spectrum antibiotics, vanco and zosyn. He reports swelling is improving. Hospital course complicated with anemia and hemoglobin of 7.0 & s/p 1 unit of PRBC today.  Hospital Course:   Principal Problem:  Scrotal cellulitis/edema/moderate sized right hydrocele  - Scrotal ultrasound: No testicular torsion and moderate sized right hydrocele 07/22/2014 - Patient follow-up with urology regarding definitive/? Surgical intervention of right hydrocele - Patient has completed 8 days course of antibiotics. Initially treated with IV vancomycin and Zosyn and subsequently switched to oral doxycycline on 5/20. He will not be discharged on any antibiotics. Cellulitis has clinically resolved. - Now he has scrotal swelling related to edema which has progressively  decreased and can now make out the rugae and also related to hydrocele. -Patient has been advised regarding scrotal elevation by the urologist and he verbalizes understanding. - Discussed with [name] who will see him as outpatient for further evaluation and management.   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 - Status post 1 unit of PRBC on 5/17 for hemoglobin 7 g per DL. Hemoglobin has improved to 8 g range and stable - No reported bleeding or fevers. - All counts are improving and stable. Close outpatient follow-up with repeat CBCs  History of alcohol abuse - Blood alcohol level less than 5. UDS positive for opiates - Continue folic acid and thiamine. - No overt withdrawal features.  Hypokalemia - Replaced  Anasarca - Likely due to hypoalbuminemia, Cirrhosis and nutritional - Most likely contributing to significant lower extremity edema extending to external genitalia - Abdominal ultrasound: Small ascites and confirmed cirrhosis - 2-D echo: Normal EF - Treated aggressively with IV Lasix and oral Aldactone was increased. - -10.3 L since admission. - Patient will be transitioned to Lasix 80 mg daily and Aldactone 50 mg daily at discharge. Continue fluid restriction. - Anasarca has significantly improved but still has some degree of volume overload.  Subtle low attenuation lesion in the pancreatic body/tail seen on CT abdomen in 2014 - Patient states that he is awaiting biopsy via PCP/oncology but unable to do secondary to thrombocytopenia. Outpatient follow-up.  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 Liver biopsy but precluded by thrombocytopenia. - He apparently has significantly cut down on his alcohol intake.  Consultants:  None  Procedures:  None  Antibiotics:  IV vancomycin-discontinued  IV Zosyn-DC'd 5/19  PO  Doxyxycline 5/19 > 5/23  Discharge Exam:  Complaints: Continues to feel better with leg swelling almost resolved. No scrotal pain and scrotal swelling progressively improving.  Filed Vitals:   08/04/14 0740 08/04/14 1415 08/04/14 2040 08/05/14 0510  BP:  114/74 108/70 90/62  Pulse:  81 80 83  Temp:  98.1 F (36.7 C) 97.7 F (36.5 C) 97.9 F (36.6 C)  TempSrc:  Oral Oral Oral  Resp:  16 20 16   Height:      Weight: 83.008 kg (183 lb)   78.382 kg (172 lb 12.8 oz)  SpO2:  96% 96% 96%    General exam: Pleasant middle-aged male lying comfortably in bed Respiratory system: Clear. No increased work of breathing. Cardiovascular system: S1 & S2 heard, RRR. No JVD, murmurs, gallops, clicks. Trace bilateral leg edema -significantly improved compared to a few days ago. Scrotal and penile edema decreasing but a lot of the scrotal swelling may be secondary to right hydrocele. No features of cellulitis at this time. Gastrointestinal system: Abdomen is nondistended, soft and nontender. Normal bowel sounds heard. Central nervous system: Alert and oriented. No focal neurological deficits. Extremities: Symmetric 5 x 5 power.  Discharge Instructions      Discharge Instructions    Call MD for:  difficulty breathing, headache or visual disturbances    Complete by:  As directed      Call MD for:  extreme fatigue    Complete by:  As directed      Call MD for:  persistant dizziness or light-headedness    Complete by:  As directed      Call MD for:  persistant nausea and vomiting    Complete by:  As directed      Call MD for:  severe uncontrolled pain    Complete by:  As directed      Call MD for:  temperature >100.4    Complete by:  As directed      Call MD for:    Complete by:  As directed   Worsening body swelling.     Diet - low sodium heart healthy    Complete by:  As directed   Fluid restriction: 1200 mls per day.     Increase activity slowly    Complete by:  As directed              Medication List    STOP taking these medications        ciprofloxacin 500 MG tablet  Commonly known as:  CIPRO     naproxen sodium 220 MG tablet  Commonly known as:  ANAPROX      TAKE these medications        acetaminophen 325 MG tablet  Commonly known as:  TYLENOL  Take 2 tablets (650 mg total) by mouth every 8 (eight) hours as needed for mild pain, moderate pain, fever or headache (or Fever >/= 101).     ARTIFICIAL TEARS OP  Place 2 drops into the right eye daily as needed (irritation). For redness/itchy     calcium carbonate 500 MG chewable tablet  Commonly known as:  TUMS - dosed in mg elemental calcium  Chew 1 tablet by mouth daily.     folic acid 1 MG tablet  Commonly known as:  FOLVITE  Take 1 tablet (1 mg total) by mouth daily.     furosemide 80 MG tablet  Commonly known as:  LASIX  Take 1 tablet (80 mg total) by mouth daily.     spironolactone 25 MG tablet  Commonly known as:  ALDACTONE  Take 2 tablets (50 mg total) by mouth daily.     thiamine 100 MG tablet  Take 1 tablet (100 mg total) by mouth daily.     traZODone 50 MG tablet  Commonly known as:  DESYREL  Take 50 mg by mouth daily as needed for sleep (sleep).       Follow-up Information    Follow up with Woody Seller, MD. Schedule an appointment as soon as possible for a visit in 3 days.   Specialty:  Family Medicine   Why:  To be seen with repeat labs (CBC & CMP).   Contact information:   [address] [place] [address] [phone]       Schedule an appointment as soon as possible for a visit with Ardis Hughs, MD.   Specialty:  Urology   Why:  To be seen for evaluation & treatment of scrotal swelling/Right hydrocele.   Contact information:   [address] [place] [address] [phone]       Follow up with Dustin Folks, MD.   Specialty:  Hematology and Oncology   Why:  Keep previous appointment.   Contact information:   [address] [place] [address] [phone]        The results of significant diagnostics from this hospitalization (including imaging, microbiology, ancillary and laboratory) are listed below for reference.    Significant Diagnostic Studies: US Scrotum  07/22/2014   CLINICAL DATA:  Testicular swelling  EXAM: SCROTAL ULTRASOUND  DOPPLER ULTRASOUND OF THE TESTICLES  TECHNIQUE: Complete ultrasound examination of the testicles, epididymis, and other scrotal structures was performed. Color and spectral Doppler ultrasound were also utilized to evaluate blood flow to the testicles.  COMPARISON:  None.  FINDINGS: Right testicle  Measurements: 3.7 x 2.4 x 2.3 cm. No mass or microlithiasis visualized.  Left testicle  Measurements: 3.8 x 2.5 x 2.5 cm. No mass or microlithiasis visualized.  Right epididymis:  Normal in size and appearance.  Left epididymis:  Normal in size and appearance.  Hydrocele:  Moderate-sized right hydrocele.  No left hydrocele.  Varicocele:  None visualized.  Pulsed Doppler interrogation of both testes demonstrates normal low resistance arterial and venous waveforms bilaterally.  IMPRESSION: 1. No testicular torsion. 2. Moderate-sized right hydrocele.   Electronically Signed   By: Kathreen Devoid   On: 07/22/2014 16:31   US Abdomen Limited  07/31/2014   CLINICAL DATA:  Acute onset of upper abdominal pain. Known history of cirrhosis. Initial encounter.  EXAM: US ABDOMEN LIMITED - RIGHT UPPER QUADRANT  COMPARISON:  CT of the abdomen and pelvis performed 06/27/2014, MRI of the abdomen performed 07/01/2014, and abdominal ultrasound performed 06/04/2014  FINDINGS: Gallbladder:  No stones or sludge seen. The gallbladder wall is diffusely thickened, measuring 6 mm, of uncertain significance. This may be related to the patient's ascites. Trace nonspecific pericholecystic fluid is seen. No ultrasonographic Murphy's sign is elicited.  Common bile duct:  Diameter: 0.4 cm, within normal limits in caliber.  Liver:  No  focal lesion identified. Within normal limits in parenchymal echogenicity. Known hepatic cirrhosis is only partially characterized, with minimal nodularity in hepatic contour. A small amount of ascites is noted surrounding the liver.  IMPRESSION: 1. No definite acute abnormality seen to explain the patient's symptoms. 2. Diffuse gallbladder wall thickening may be related to  the patient's ascites. Trace nonspecific pericholecystic fluid seen. No ultrasonographic Murphy's sign elicited. No stones or sludge seen. 3. Small amount of ascites noted surrounding the liver. 4. Known hepatic cirrhosis is only partially characterized on ultrasound.   Electronically Signed   By: Garald Balding M.D.   On: 07/31/2014 22:15   [place] Art/ven Flow Abd Pelv Doppler  07/22/2014   CLINICAL DATA:  Testicular swelling  EXAM: SCROTAL ULTRASOUND  DOPPLER ULTRASOUND OF THE TESTICLES  TECHNIQUE: Complete ultrasound examination of the testicles, epididymis, and other scrotal structures was performed. Color and spectral Doppler ultrasound were also utilized to evaluate blood flow to the testicles.  COMPARISON:  None.  FINDINGS: Right testicle  Measurements: 3.7 x 2.4 x 2.3 cm. No mass or microlithiasis visualized.  Left testicle  Measurements: 3.8 x 2.5 x 2.5 cm. No mass or microlithiasis visualized.  Right epididymis:  Normal in size and appearance.  Left epididymis:  Normal in size and appearance.  Hydrocele:  Moderate-sized right hydrocele.  No left hydrocele.  Varicocele:  None visualized.  Pulsed Doppler interrogation of both testes demonstrates normal low resistance arterial and venous waveforms bilaterally.  IMPRESSION: 1. No testicular torsion. 2. Moderate-sized right hydrocele.   Electronically Signed   By: Kathreen Devoid   On: 07/22/2014 16:31    Microbiology: Recent Results (from the past 240 hour(s))  Urine culture     Status: None   Collection Time: 07/29/14  9:52 PM  Result Value Ref Range Status   Specimen Description URINE,  CLEAN CATCH  Final   Special Requests NONE  Final   Colony Count NO GROWTH Performed at [facility]   Final   Culture NO GROWTH Performed at [facility]   Final   Report Status 07/30/2014 FINAL  Final     Labs: Basic Metabolic Panel:  Recent Labs Lab 07/29/14 1757  08/01/14 0350 08/02/14 0812 08/03/14 0422 08/04/14 0403 08/05/14 0408  NA 135  < > 134* 135 135 137 136  K 4.8  < > 3.4* 3.2* 3.5 4.3 3.9  CL 102  < > 102 98* 98* 99* 97*  CO2 26  < > 25 29 30  33* 33*  GLUCOSE 131*  < > 106* 152* 129* 92 132*  BUN 16  < > 19 20 20 19 17   CREATININE 0.69  < > 0.76 0.82 0.86 0.65 0.77  CALCIUM 8.5*  < > 8.1* 8.3* 8.2* 8.7* 8.6*  MG 1.3*  --   --   --   --   --   --   PHOS 4.1  --   --   --   --   --   --   < > = values in this interval not displayed. Liver Function Tests:  Recent Labs Lab 07/29/14 1757 07/30/14 0420  AST 71* 59*  ALT 27 21  ALKPHOS 114 92  BILITOT 2.2* 2.0*  PROT 7.5 6.2*  ALBUMIN 2.0* 1.7*   No results for input(s): LIPASE, AMYLASE in the last 168 hours. No results for input(s): AMMONIA in the last 168 hours. CBC:  Recent Labs Lab 07/29/14 1757  07/31/14 0350 08/01/14 0350 08/02/14 0812 08/03/14 0422 08/04/14 0403  WBC 3.6*  < > 2.7* 2.9* 2.8* 2.5* 3.2*  NEUTROABS 2.4  --   --   --   --   --   --   HGB 8.1*  < > 8.1* 8.6* 9.3* 8.4* 9.1*  HCT 22.6*  < > 23.8* 24.4* 26.8*  24.5* 26.4*  MCV 105.6*  < > 102.1* 101.7* 101.9* 103.4* 102.7*  PLT 104*  < > 62* 103* 101* 87* 117*  < > = values in this interval not displayed. Cardiac Enzymes: No results for input(s): CKTOTAL, CKMB, CKMBINDEX, TROPONINI in the last 168 hours. BNP: BNP (last 3 results) No results for input(s): BNP in the last 8760 hours.  ProBNP (last 3 results) No results for input(s): PROBNP in the last 8760 hours.  CBG:  Recent Labs Lab 08/01/14 0737 08/02/14 0748 08/03/14 0743 08/04/14 0754 08/05/14 0820  GLUCAP 121* 105* 96 89 130*         Signed:  Vernell Leep, MD, FACP, FHM. [facility] Pager [phone]  If 7PM-7AM, please contact night-coverage [email or web address] Password [facility] 08/05/2014, 10:29 AM

## 2014-08-05 NOTE — Progress Notes (Signed)
Nursing Discharge Summary  Patient ID: [name] MRN: [id number] DOB/AGE: 1955/04/10 59 y.o.  Admit date: 07/29/2014 Discharge date: 08/05/2014  Discharged Condition: good  Disposition: 01-Home or Self Care  Follow-up Information    Follow up with Woody Seller, MD. Schedule an appointment as soon as possible for a visit in 3 days.   Specialty:  Family Medicine   Why:  To be seen with repeat labs (CBC & CMP).   Contact information:   [address] [place] [address] [phone]       Schedule an appointment as soon as possible for a visit with Ardis Hughs, MD.   Specialty:  Urology   Why:  To be seen for evaluation & treatment of scrotal swelling/Right hydrocele.   Contact information:   [address] [place] [address] [phone]       Follow up with Dustin Folks, MD.   Specialty:  Hematology and Oncology   Why:  Keep previous appointment.   Contact information:   [address] [place] [address] [phone]       Follow up with Ardis Hughs, MD.   Specialty:  Urology   Why:  Pt has a f/u appt with [name] on Monday June 6 at 12:30    Contact information:   [place] [address] [phone]       Follow up with Woody Seller, MD.   Specialty:  Family Medicine   Why:  F/u with Dr. Verl Blalock but will see [name] Tuesday at 10:20 May 24.   Contact information:   [address] [place]  [address] [phone]       Prescriptions Given: Prescriptions given to patient with wife at bedside. Patient follow up appointments and medications discussed and both patient and wife verbalized understanding.  Means of Discharge: patient to be taken downstairs via wheelchair to be transported home.   Signed: Buel Ream 08/05/2014, 12:56 PM

## 2014-10-10 ENCOUNTER — Emergency Department (HOSPITAL_COMMUNITY): Payer: 59

## 2014-10-10 ENCOUNTER — Inpatient Hospital Stay (HOSPITAL_COMMUNITY)
Admission: EM | Admit: 2014-10-10 | Discharge: 2014-10-17 | DRG: 377 | Disposition: A | Discharge status: Home / Self Care | Payer: 59 | Attending: Internal Medicine | Admitting: Internal Medicine

## 2014-10-10 ENCOUNTER — Encounter (HOSPITAL_COMMUNITY): Payer: Self-pay

## 2014-10-10 DIAGNOSIS — J90 Pleural effusion, not elsewhere classified: Secondary | ICD-10-CM | POA: Diagnosis present

## 2014-10-10 DIAGNOSIS — D709 Neutropenia, unspecified: Secondary | ICD-10-CM | POA: Diagnosis present

## 2014-10-10 DIAGNOSIS — D638 Anemia in other chronic diseases classified elsewhere: Secondary | ICD-10-CM | POA: Diagnosis present

## 2014-10-10 DIAGNOSIS — Z823 Family history of stroke: Secondary | ICD-10-CM

## 2014-10-10 DIAGNOSIS — Z79899 Other long term (current) drug therapy: Secondary | ICD-10-CM | POA: Diagnosis not present

## 2014-10-10 DIAGNOSIS — Y908 Blood alcohol level of 240 mg/100 ml or more: Secondary | ICD-10-CM | POA: Diagnosis present

## 2014-10-10 DIAGNOSIS — G8929 Other chronic pain: Secondary | ICD-10-CM | POA: Diagnosis present

## 2014-10-10 DIAGNOSIS — Z809 Family history of malignant neoplasm, unspecified: Secondary | ICD-10-CM

## 2014-10-10 DIAGNOSIS — M549 Dorsalgia, unspecified: Secondary | ICD-10-CM | POA: Diagnosis present

## 2014-10-10 DIAGNOSIS — K7031 Alcoholic cirrhosis of liver with ascites: Secondary | ICD-10-CM | POA: Diagnosis present

## 2014-10-10 DIAGNOSIS — D649 Anemia, unspecified: Secondary | ICD-10-CM | POA: Diagnosis present

## 2014-10-10 DIAGNOSIS — J810 Acute pulmonary edema: Secondary | ICD-10-CM | POA: Diagnosis present

## 2014-10-10 DIAGNOSIS — J811 Chronic pulmonary edema: Secondary | ICD-10-CM

## 2014-10-10 DIAGNOSIS — R0989 Other specified symptoms and signs involving the circulatory and respiratory systems: Secondary | ICD-10-CM | POA: Diagnosis present

## 2014-10-10 DIAGNOSIS — F10129 Alcohol abuse with intoxication, unspecified: Secondary | ICD-10-CM | POA: Diagnosis present

## 2014-10-10 DIAGNOSIS — Z791 Long term (current) use of non-steroidal anti-inflammatories (NSAID): Secondary | ICD-10-CM

## 2014-10-10 DIAGNOSIS — K922 Gastrointestinal hemorrhage, unspecified: Secondary | ICD-10-CM | POA: Diagnosis present

## 2014-10-10 DIAGNOSIS — K219 Gastro-esophageal reflux disease without esophagitis: Secondary | ICD-10-CM | POA: Diagnosis present

## 2014-10-10 DIAGNOSIS — N433 Hydrocele, unspecified: Secondary | ICD-10-CM | POA: Diagnosis present

## 2014-10-10 DIAGNOSIS — E8770 Fluid overload, unspecified: Secondary | ICD-10-CM | POA: Diagnosis present

## 2014-10-10 DIAGNOSIS — F419 Anxiety disorder, unspecified: Secondary | ICD-10-CM | POA: Diagnosis present

## 2014-10-10 DIAGNOSIS — Z886 Allergy status to analgesic agent status: Secondary | ICD-10-CM | POA: Diagnosis not present

## 2014-10-10 DIAGNOSIS — F1010 Alcohol abuse, uncomplicated: Secondary | ICD-10-CM | POA: Diagnosis present

## 2014-10-10 DIAGNOSIS — E871 Hypo-osmolality and hyponatremia: Secondary | ICD-10-CM | POA: Diagnosis present

## 2014-10-10 DIAGNOSIS — D6959 Other secondary thrombocytopenia: Secondary | ICD-10-CM | POA: Diagnosis present

## 2014-10-10 DIAGNOSIS — Z87891 Personal history of nicotine dependence: Secondary | ICD-10-CM | POA: Diagnosis not present

## 2014-10-10 DIAGNOSIS — K7460 Unspecified cirrhosis of liver: Secondary | ICD-10-CM

## 2014-10-10 DIAGNOSIS — D61818 Other pancytopenia: Secondary | ICD-10-CM | POA: Diagnosis present

## 2014-10-10 DIAGNOSIS — K766 Portal hypertension: Secondary | ICD-10-CM | POA: Diagnosis present

## 2014-10-10 DIAGNOSIS — D62 Acute posthemorrhagic anemia: Secondary | ICD-10-CM | POA: Insufficient documentation

## 2014-10-10 DIAGNOSIS — K625 Hemorrhage of anus and rectum: Secondary | ICD-10-CM | POA: Diagnosis present

## 2014-10-10 DIAGNOSIS — R161 Splenomegaly, not elsewhere classified: Secondary | ICD-10-CM | POA: Diagnosis present

## 2014-10-10 DIAGNOSIS — Z8249 Family history of ischemic heart disease and other diseases of the circulatory system: Secondary | ICD-10-CM | POA: Diagnosis not present

## 2014-10-10 DIAGNOSIS — J9601 Acute respiratory failure with hypoxia: Secondary | ICD-10-CM | POA: Diagnosis present

## 2014-10-10 DIAGNOSIS — A419 Sepsis, unspecified organism: Secondary | ICD-10-CM | POA: Diagnosis not present

## 2014-10-10 DIAGNOSIS — K769 Liver disease, unspecified: Secondary | ICD-10-CM

## 2014-10-10 DIAGNOSIS — M130 Polyarthritis, unspecified: Secondary | ICD-10-CM | POA: Diagnosis present

## 2014-10-10 DIAGNOSIS — D696 Thrombocytopenia, unspecified: Secondary | ICD-10-CM | POA: Diagnosis present

## 2014-10-10 DIAGNOSIS — D539 Nutritional anemia, unspecified: Secondary | ICD-10-CM | POA: Diagnosis present

## 2014-10-10 DIAGNOSIS — K7030 Alcoholic cirrhosis of liver without ascites: Secondary | ICD-10-CM | POA: Diagnosis not present

## 2014-10-10 HISTORY — DX: Unspecified cirrhosis of liver: K74.60

## 2014-10-10 LAB — CBC WITH DIFFERENTIAL/PLATELET
BASOS ABS: 0 10*3/uL (ref 0.0–0.1)
Basophils Relative: 0 % (ref 0–1)
EOS ABS: 0 10*3/uL (ref 0.0–0.7)
Eosinophils Relative: 2 % (ref 0–5)
HCT: 23.7 % — ABNORMAL LOW (ref 39.0–52.0)
HEMOGLOBIN: 8.1 g/dL — AB (ref 13.0–17.0)
LYMPHS ABS: 0.5 10*3/uL — AB (ref 0.7–4.0)
Lymphocytes Relative: 38 % (ref 12–46)
MCH: 35.4 pg — AB (ref 26.0–34.0)
MCHC: 34.2 g/dL (ref 30.0–36.0)
MCV: 103.5 fL — AB (ref 78.0–100.0)
MONOS PCT: 13 % — AB (ref 3–12)
Monocytes Absolute: 0.2 10*3/uL (ref 0.1–1.0)
Neutro Abs: 0.5 10*3/uL — ABNORMAL LOW (ref 1.7–7.7)
Neutrophils Relative %: 47 % (ref 43–77)
PLATELETS: 13 10*3/uL — AB (ref 150–400)
RBC: 2.29 MIL/uL — ABNORMAL LOW (ref 4.22–5.81)
RDW: 13.5 % (ref 11.5–15.5)
WBC: 1.2 10*3/uL — AB (ref 4.0–10.5)

## 2014-10-10 LAB — I-STAT TROPONIN, ED: Troponin i, poc: 0.02 ng/mL (ref 0.00–0.08)

## 2014-10-10 LAB — POC OCCULT BLOOD, ED: FECAL OCCULT BLD: POSITIVE — AB

## 2014-10-10 LAB — COMPREHENSIVE METABOLIC PANEL
ALK PHOS: 145 U/L — AB (ref 38–126)
ALT: 42 U/L (ref 17–63)
AST: 184 U/L — ABNORMAL HIGH (ref 15–41)
Albumin: 2.5 g/dL — ABNORMAL LOW (ref 3.5–5.0)
Anion gap: 7 (ref 5–15)
BILIRUBIN TOTAL: 1.5 mg/dL — AB (ref 0.3–1.2)
BUN: 7 mg/dL (ref 6–20)
CO2: 27 mmol/L (ref 22–32)
Calcium: 8.4 mg/dL — ABNORMAL LOW (ref 8.9–10.3)
Chloride: 99 mmol/L — ABNORMAL LOW (ref 101–111)
Creatinine, Ser: 0.57 mg/dL — ABNORMAL LOW (ref 0.61–1.24)
GFR calc Af Amer: >60 mL/min (ref >60)
Glucose, Bld: 114 mg/dL — ABNORMAL HIGH (ref 65–99)
Potassium: 4.1 mmol/L (ref 3.5–5.1)
Sodium: 133 mmol/L — ABNORMAL LOW (ref 135–145)
TOTAL PROTEIN: 7.8 g/dL (ref 6.5–8.1)

## 2014-10-10 LAB — BRAIN NATRIURETIC PEPTIDE: B NATRIURETIC PEPTIDE 5: 120.9 pg/mL — AB (ref 0.0–100.0)

## 2014-10-10 LAB — PROTIME-INR
INR: 1.49 (ref 0.00–1.49)
Prothrombin Time: 18.1 seconds — ABNORMAL HIGH (ref 11.6–15.2)

## 2014-10-10 LAB — ETHANOL: Alcohol, Ethyl (B): 256 mg/dL — ABNORMAL HIGH (ref <5)

## 2014-10-10 LAB — PREPARE RBC (CROSSMATCH)

## 2014-10-10 MED ORDER — PANTOPRAZOLE SODIUM 40 MG IV SOLR: 40.0000 mg | Freq: Two times a day (BID) | INTRAVENOUS | Status: DC

## 2014-10-10 MED ORDER — BACLOFEN 10 MG PO TABS: 10.0000 mg | ORAL_TABLET | Freq: Every evening | ORAL | Status: DC | PRN

## 2014-10-10 MED ORDER — MORPHINE SULFATE 2 MG/ML IJ SOLN
1.0000 mg | INTRAMUSCULAR | Status: DC | PRN
  Administered 2014-10-11: 1 mg via INTRAVENOUS
  Filled 2014-10-10: qty 1

## 2014-10-10 MED ORDER — ADULT MULTIVITAMIN W/MINERALS CH
1.0000 | ORAL_TABLET | Freq: Every day | ORAL | Status: DC
  Administered 2014-10-11 – 2014-10-17 (×6): 1 via ORAL
  Filled 2014-10-10 (×6): qty 1

## 2014-10-10 MED ORDER — FUROSEMIDE 10 MG/ML IJ SOLN
20.0000 mg | Freq: Once | INTRAMUSCULAR | Status: AC
  Administered 2014-10-11: 20 mg via INTRAVENOUS
  Filled 2014-10-10: qty 2

## 2014-10-10 MED ORDER — BACLOFEN 10 MG PO TABS: 1.0000 mg | ORAL_TABLET | Freq: Every evening | ORAL | Status: DC | PRN

## 2014-10-10 MED ORDER — ONDANSETRON HCL 4 MG PO TABS: 4.0000 mg | ORAL_TABLET | Freq: Four times a day (QID) | ORAL | Status: DC | PRN

## 2014-10-10 MED ORDER — DEXTROSE 5 % IV SOLN: 1.0000 g | INTRAVENOUS | Status: DC

## 2014-10-10 MED ORDER — ONDANSETRON HCL 4 MG/2ML IJ SOLN: 4.0000 mg | Freq: Four times a day (QID) | INTRAMUSCULAR | Status: DC | PRN

## 2014-10-10 MED ORDER — PANTOPRAZOLE SODIUM 40 MG IV SOLR
40.0000 mg | Freq: Two times a day (BID) | INTRAVENOUS | Status: DC
  Administered 2014-10-14 – 2014-10-17 (×7): 40 mg via INTRAVENOUS
  Filled 2014-10-10 (×7): qty 40

## 2014-10-10 MED ORDER — LORAZEPAM 2 MG/ML IJ SOLN
1.0000 mg | Freq: Four times a day (QID) | INTRAMUSCULAR | Status: AC | PRN
  Filled 2014-10-10: qty 1

## 2014-10-10 MED ORDER — SODIUM CHLORIDE 0.9 % IV SOLN
10.0000 mL/h | Freq: Once | INTRAVENOUS | Status: AC
  Administered 2014-10-10: 10 mL/h via INTRAVENOUS

## 2014-10-10 MED ORDER — SODIUM CHLORIDE 0.9 % IV SOLN: Freq: Once | INTRAVENOUS | Status: DC

## 2014-10-10 MED ORDER — OXYCODONE HCL 5 MG PO TABS
5.0000 mg | ORAL_TABLET | ORAL | Status: DC | PRN
  Administered 2014-10-10 – 2014-10-17 (×22): 5 mg via ORAL
  Filled 2014-10-10 (×23): qty 1

## 2014-10-10 MED ORDER — FOLIC ACID 1 MG PO TABS
1.0000 mg | ORAL_TABLET | Freq: Every day | ORAL | Status: DC
  Administered 2014-10-11 – 2014-10-17 (×6): 1 mg via ORAL
  Filled 2014-10-10 (×6): qty 1

## 2014-10-10 MED ORDER — SODIUM CHLORIDE 0.9 % IV SOLN
8.0000 mg/h | INTRAVENOUS | Status: AC
  Administered 2014-10-10 – 2014-10-13 (×6): 8 mg/h via INTRAVENOUS
  Filled 2014-10-10 (×16): qty 80

## 2014-10-10 MED ORDER — POLYVINYL ALCOHOL 1.4 % OP SOLN
1.0000 [drp] | Freq: Every day | OPHTHALMIC | Status: DC | PRN
  Filled 2014-10-10: qty 15

## 2014-10-10 MED ORDER — SPIRONOLACTONE 25 MG PO TABS
25.0000 mg | ORAL_TABLET | Freq: Every day | ORAL | Status: DC
  Administered 2014-10-11 – 2014-10-13 (×3): 25 mg via ORAL
  Filled 2014-10-10 (×3): qty 1

## 2014-10-10 MED ORDER — ARTIFICIAL TEARS OP OINT: TOPICAL_OINTMENT | Freq: Every day | OPHTHALMIC | Status: DC | PRN

## 2014-10-10 MED ORDER — CEFTRIAXONE SODIUM IN DEXTROSE 40 MG/ML IV SOLN
2.0000 g | INTRAVENOUS | Status: DC
  Administered 2014-10-10 – 2014-10-16 (×7): 2 g via INTRAVENOUS
  Filled 2014-10-10 (×8): qty 50

## 2014-10-10 MED ORDER — FUROSEMIDE 10 MG/ML IJ SOLN
20.0000 mg | Freq: Once | INTRAMUSCULAR | Status: AC
  Administered 2014-10-10: 20 mg via INTRAVENOUS
  Filled 2014-10-10: qty 4

## 2014-10-10 MED ORDER — SODIUM CHLORIDE 0.9 % IJ SOLN
3.0000 mL | Freq: Two times a day (BID) | INTRAMUSCULAR | Status: DC
  Administered 2014-10-11 – 2014-10-17 (×11): 3 mL via INTRAVENOUS

## 2014-10-10 MED ORDER — VITAMIN B-1 100 MG PO TABS
100.0000 mg | ORAL_TABLET | Freq: Every day | ORAL | Status: DC
  Administered 2014-10-11 – 2014-10-17 (×6): 100 mg via ORAL
  Filled 2014-10-10 (×7): qty 1

## 2014-10-10 MED ORDER — THIAMINE HCL 100 MG/ML IJ SOLN
100.0000 mg | Freq: Every day | INTRAMUSCULAR | Status: DC
  Administered 2014-10-10 – 2014-10-14 (×2): 100 mg via INTRAVENOUS
  Filled 2014-10-10 (×3): qty 2

## 2014-10-10 MED ORDER — SODIUM CHLORIDE 0.9 % IV SOLN: 250.0000 mL | INTRAVENOUS | Status: DC | PRN

## 2014-10-10 MED ORDER — SODIUM CHLORIDE 0.9 % IJ SOLN: 3.0000 mL | INTRAMUSCULAR | Status: DC | PRN

## 2014-10-10 MED ORDER — LORAZEPAM 1 MG PO TABS
1.0000 mg | ORAL_TABLET | Freq: Four times a day (QID) | ORAL | Status: AC | PRN
  Administered 2014-10-11 – 2014-10-13 (×9): 1 mg via ORAL
  Filled 2014-10-10 (×9): qty 1

## 2014-10-10 MED ORDER — SODIUM CHLORIDE 0.9 % IV SOLN
80.0000 mg | Freq: Once | INTRAVENOUS | Status: AC
  Administered 2014-10-10: 80 mg via INTRAVENOUS
  Filled 2014-10-10: qty 80

## 2014-10-10 NOTE — ED Provider Notes (Signed)
CSN: [id number]     Arrival date & time 10/10/14  1438 History   First MD Initiated Contact with Patient 10/10/14 1507     Chief Complaint  Patient presents with  . Abnormal Lab     (Consider location/radiation/quality/duration/timing/severity/associated sxs/prior Treatment) HPI Comments: Pt with a hx of ETOH abuse and cirrhosis presents with fatigue and SOB with rectal bleeding.  States that he had BRBPR 5 days ago.  Over the last 2 days has had black, tarry stools.  Has had increased fatigue and some mild SOB and lightheadedness.  Has had bleeding before and had to get blood transfusions.  Saw his doctor today or yesterday, Dr. Redmond Pulling with [facility], who ordered labs.  He was contacted today and told that he needed to come here for admission and possible blood transfusion.  He continues to drink alcohol.  He last beer was about one hour PTA.  He denies any abdominal pain.  He has nausea, but no vomiting.   Past Medical History  Diagnosis Date  . GERD (gastroesophageal reflux disease)   . 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 Transfusion history 08-16-12    '99  . Pancreatitis 08-16-12    at present weakness,fatiques easily  . Foreign body 08-16-12    nose"BB" pellet since age 64  . History of ETOH abuse 08-16-12    Heavy alcohol use daily  . Anemia   . Anxiety   . Clotting disorder   . Substance abuse   . Cirrhosis    Past Surgical History  Procedure Laterality Date  . Neck surgery      fusion of neck-titanium hardware inplaced  . Esophagogastroduodenoscopy    . Colonoscopy    . Eus N/A 08/18/2012    Procedure: UPPER ENDOSCOPIC ULTRASOUND (EUS) LINEAR;  Surgeon: Beryle Beams, MD;  Location: [facility];  Service: Endoscopy;  Laterality: N/A;  . Neck surgery  [phone]   Family History  Problem Relation Age of Onset  . Hypertension Father   . Cancer Mother   . Stroke Mother    History  Substance Use Topics  . Smoking status: Former Smoker    Types: Cigarettes   Quit date: 08/16/2005  . Smokeless tobacco: Never Used  . Alcohol Use: 50.4 oz/week    84 Cans of beer per week     Comment: 12 cans beer per day    Review of Systems  Constitutional: Positive for fatigue. Negative for fever, chills and diaphoresis.  HENT: Negative for congestion, rhinorrhea and sneezing.   Eyes: Negative.   Respiratory: Positive for shortness of breath. Negative for cough and chest tightness.   Cardiovascular: Negative for chest pain and leg swelling.  Gastrointestinal: Positive for nausea and blood in stool. Negative for vomiting, abdominal pain and diarrhea.  Genitourinary: Negative for frequency, hematuria, flank pain and difficulty urinating.  Musculoskeletal: Negative for back pain and arthralgias.  Skin: Negative for rash.  Neurological: Positive for light-headedness. Negative for dizziness, speech difficulty, weakness, numbness and headaches.      Allergies  Aspirin  Home Medications   Prior to Admission medications   Medication Sig Start Date End Date Taking? Authorizing Provider  baclofen (LIORESAL) 10 MG tablet Take 1-2 mg by mouth at bedtime as needed for muscle spasms.   Yes Historical Provider, MD  calcium carbonate (TUMS - DOSED IN MG ELEMENTAL CALCIUM) 500 MG chewable tablet Chew 1 tablet by mouth daily.   Yes Historical Provider, MD  folic acid (FOLVITE) 1 MG  tablet Take 1 tablet (1 mg total) by mouth daily. 08/05/14  Yes Modena Jansky, MD  furosemide (LASIX) 80 MG tablet Take 1 tablet (80 mg total) by mouth daily. 08/05/14  Yes Modena Jansky, MD  Hypromellose (ARTIFICIAL TEARS OP) Place 2 drops into the right eye daily as needed (irritation). For redness/itchy   Yes Historical Provider, MD  naproxen sodium (ANAPROX) 220 MG tablet Take 440 mg by mouth daily as needed (pain).   Yes Historical Provider, MD  spironolactone (ALDACTONE) 25 MG tablet Take 2 tablets (50 mg total) by mouth daily. Patient taking differently: Take 25 mg by mouth daily.   08/05/14  Yes Modena Jansky, MD  thiamine 100 MG tablet Take 1 tablet (100 mg total) by mouth daily. 08/05/14  Yes Modena Jansky, MD  acetaminophen (TYLENOL) 325 MG tablet Take 2 tablets (650 mg total) by mouth every 8 (eight) hours as needed for mild pain, moderate pain, fever or headache (or Fever >/= 101). 08/05/14   Modena Jansky, MD   BP 117/67 mmHg  Pulse 94  Temp(Src) 97.4 F (36.3 C) (Oral)  Resp 18  SpO2 99% Physical Exam  Constitutional: He is oriented to person, place, and time. He appears well-developed and well-nourished.  HENT:  Head: Normocephalic and atraumatic.  Eyes: Pupils are equal, round, and reactive to light.  Neck: Normal range of motion. Neck supple.  Cardiovascular: Normal rate, regular rhythm and normal heart sounds.   Pulmonary/Chest: Effort normal and breath sounds normal. No respiratory distress. He has no wheezes. He has no rales. He exhibits no tenderness.  Abdominal: Soft. Bowel sounds are normal. He exhibits distension. There is no tenderness. There is no rebound and no guarding.  Musculoskeletal: Normal range of motion. He exhibits no edema.  Lymphadenopathy:    He has no cervical adenopathy.  Neurological: He is alert and oriented to person, place, and time.  Skin: Skin is warm and dry. No rash noted.  Psychiatric: He has a normal mood and affect.    ED Course  Procedures (including critical care time) Labs Review Labs Reviewed  COMPREHENSIVE METABOLIC PANEL - Abnormal; Notable for the following:    Sodium 133 (*)    Chloride 99 (*)    Glucose, Bld 114 (*)    Creatinine, Ser 0.57 (*)    Calcium 8.4 (*)    Albumin 2.5 (*)    AST 184 (*)    Alkaline Phosphatase 145 (*)    Total Bilirubin 1.5 (*)    All other components within normal limits  CBC WITH DIFFERENTIAL/PLATELET - Abnormal; Notable for the following:    WBC 1.2 (*)    RBC 2.29 (*)    Hemoglobin 8.1 (*)    HCT 23.7 (*)    MCV 103.5 (*)    MCH 35.4 (*)    Platelets 13 (*)     Monocytes Relative 13 (*)    Neutro Abs 0.5 (*)    Lymphs Abs 0.5 (*)    All other components within normal limits  PROTIME-INR - Abnormal; Notable for the following:    Prothrombin Time 18.1 (*)    All other components within normal limits  ETHANOL - Abnormal; Notable for the following:    Alcohol, Ethyl (B) 256 (*)    All other components within normal limits  BRAIN NATRIURETIC PEPTIDE - Abnormal; Notable for the following:    B Natriuretic Peptide 120.9 (*)    All other components within normal limits  POC  OCCULT BLOOD, ED - Abnormal; Notable for the following:    Fecal Occult Bld POSITIVE (*)    All other components within normal limits  CBC  CBC  I-STAT TROPOININ, ED  PREPARE PLATELET PHERESIS  TYPE AND SCREEN  PREPARE RBC (CROSSMATCH)    Imaging Review Dg Chest 2 View  10/10/2014   CLINICAL DATA:  Cough.  Shortness breath.  Weakness and fatigue.  EXAM: CHEST - 2 VIEW  COMPARISON:  Two-view chest x-ray 05/10/2014.  FINDINGS: The heart size is upper limits of normal. Mild pulmonary vascular congestion is noted. Small bilateral pleural effusions are present, left greater than right. There is mild associated airspace disease likely reflecting atelectasis the upper lung fields are clear. The visualized soft tissues and bony thorax are remarkable for cervical spine surgery. Exaggerated thoracic kyphosis is noted.  IMPRESSION: 1. Borderline cardiomegaly with pulmonary vascular congestion and new bilateral effusions is concerning for early congestive heart failure. 2. Mild bibasilar airspace disease likely reflects atelectasis associated with the effusions.   Electronically Signed   By: San Morelle M.D.   On: 10/10/2014 15:49     EKG Interpretation   Date/Time:  Thursday October 10 2014 15:25:59 EDT Ventricular Rate:  95 PR Interval:  193 QRS Duration: 100 QT Interval:  384 QTC Calculation: 483 R Axis:   101 Text Interpretation:  Sinus rhythm Right axis deviation Low  voltage,  extremity leads Borderline prolonged QT interval Baseline wander in  lead(s) V3 Confirmed by Kathya Wilz  MD, Hasnain Manheim ([id number]) on 10/10/2014 3:49:03 PM      MDM   Final diagnoses:  ETOH abuse  Alcoholic cirrhosis of liver with ascites  Thrombocytopenia  Rectal bleeding    Patient presents with rectal bleeding in the setting of severe thrombocytopenia. He did have Hemoccult-positive stool with gross blood but has had no ongoing episodes of bleeding. His platelet count is markedly low and given the bleeding, I did order some platelet transfusions. His blood pressures been stable. His hemoglobin is only slightly lower than his baseline values. I consulted the hospitalist to admit the patient. I spoke with gastroenterology as well. I initially spoke with Dr. Collene Mares and as the patient has seen Dr. Benson Norway in the past. Dr. Collene Mares states that the patient has been discharged from their practice. I spoke with Dr. Cristina Gong,  he will put the patient on the consult list to be seen. Patient also had some pulmonary vascular congestion. His oxygen levels have been in the mid upper 90s. He is mildly tachycardic. He denies any chest pain. His EKG does not show ischemic changes. He was given a dose of Lasix in the ED.    Malvin Johns, MD 10/10/14 [phone]

## 2014-10-10 NOTE — ED Notes (Signed)
Pt sent by PCP, due to abnormal lab values.  Pt reports "everything is out of whack."  C/o rectal bleeding x 1 week and weakness x 2-3 days.  Denies pain.  Hx of cirrhosis.

## 2014-10-10 NOTE — Consult Note (Signed)
Referring Provider: Dr. Niel Hummer ([facility]) Primary Care Physician:  Woody Seller, MD Primary Gastroenterologist:  None (unassigned)  Reason for Consultation:  GI bleeding  HPI: [name] is a 59 y.o. male with a history of ethanol abuse admitted to the hospital with dark stools, anemia, and profound thrombocytopenia.   He gives a roughly 1 week history of dark stools, transiently frank red blood, and then dark stools again. No syncope, no shortness of breath, no abdominal pain. No dyspepsia or nausea.  The patient's admission hemoglobin was 8.1, as compared to his normal baseline which runs between 8.5 and 9.1 (most recently was 9.1 on 08/04/2014). His BUN on admission was normal at 7.  Has been using Aleve fairly regularly because he has chronic back pain.  There is a history of long-standing (25 years) ethanol abuse, which he indicates is at least 12 beers a day. Most recent imaging studies at this facility 2 years ago showed an enlarged fatty liver but no splenomegaly, ascites, or evidence of varices to suggest cirrhosis, and an EUS at that time by Dr. Carol Ada (for evaluation of a possible pancreatic lesion) made no mention of varices.   Incidentally, the cytology from fine-needle aspirate during that procedure showed possible neuroendocrine tumor, but was nondiagnostic.  His last bloody bowel movement was several hours ago.  He indicates he has been evaluated for low blood counts by Dr. Julieanne Manson (?sp) in [facility], with a bone marrow exam that apparently did not show any specific underlying hematologic disease.  "They said it was cirrhosis of the liver."  His platelet count on admission was 13,000K and his absolute neutrophil count is about 600.  The patient's wife indicates that he had colonoscopy in a couple of occasions, including once in [place] by Dr. Manus Rudd [facility]), although reports of that are not currently available.   Past Medical  History  Diagnosis Date  . GERD (gastroesophageal reflux disease)   . 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 Transfusion history 08-16-12    '99  . Pancreatitis 08-16-12    at present weakness,fatiques easily  . Foreign body 08-16-12    nose"BB" pellet since age 41  . History of ETOH abuse 08-16-12    Heavy alcohol use daily  . Anemia   . Anxiety   . Clotting disorder   . Substance abuse   . Cirrhosis     Past Surgical History  Procedure Laterality Date  . Neck surgery      fusion of neck-titanium hardware inplaced  . Esophagogastroduodenoscopy    . Colonoscopy    . Eus N/A 08/18/2012    Procedure: UPPER ENDOSCOPIC ULTRASOUND (EUS) LINEAR;  Surgeon: Beryle Beams, MD;  Location: [facility];  Service: Endoscopy;  Laterality: N/A;  . Neck surgery  [phone]    Prior to Admission medications   Medication Sig Start Date End Date Taking? Authorizing Provider  baclofen (LIORESAL) 10 MG tablet Take 10-20 mg by mouth at bedtime as needed for muscle spasms.    Yes Historical Provider, MD  calcium carbonate (TUMS - DOSED IN MG ELEMENTAL CALCIUM) 500 MG chewable tablet Chew 1 tablet by mouth daily.   Yes Historical Provider, MD  folic acid (FOLVITE) 1 MG tablet Take 1 tablet (1 mg total) by mouth daily. 08/05/14  Yes Modena Jansky, MD  furosemide (LASIX) 80 MG tablet Take 1 tablet (80 mg total) by mouth daily. 08/05/14  Yes Modena Jansky, MD  Hypromellose (ARTIFICIAL TEARS OP)  Place 2 drops into the right eye daily as needed (irritation). For redness/itchy   Yes Historical Provider, MD  naproxen sodium (ANAPROX) 220 MG tablet Take 440 mg by mouth daily as needed (pain).   Yes Historical Provider, MD  spironolactone (ALDACTONE) 25 MG tablet Take 2 tablets (50 mg total) by mouth daily. Patient taking differently: Take 25 mg by mouth daily.  08/05/14  Yes Modena Jansky, MD  thiamine 100 MG tablet Take 1 tablet (100 mg total) by mouth daily. 08/05/14  Yes Modena Jansky, MD   acetaminophen (TYLENOL) 325 MG tablet Take 2 tablets (650 mg total) by mouth every 8 (eight) hours as needed for mild pain, moderate pain, fever or headache (or Fever >/= 101). 08/05/14   Modena Jansky, MD    Current Facility-Administered Medications  Medication Dose Route Frequency Provider Last Rate Last Dose  . 0.9 %  sodium chloride infusion  250 mL Intravenous PRN Belkys A Regalado, MD      . 0.9 %  sodium chloride infusion   Intravenous Once Belkys A Regalado, MD      . baclofen (LIORESAL) tablet 10-20 mg  10-20 mg Oral QHS PRN Belkys A Regalado, MD      . cefTRIAXone (ROCEPHIN) 2 g in dextrose 5 % 50 mL IVPB - Premix  2 g Intravenous Q24H Garnet Sierras, RPH   2 g at 10/10/14 2139  . folic acid (FOLVITE) tablet 1 mg  1 mg Oral Daily Belkys A Regalado, MD   1 mg at 10/10/14 1900  . furosemide (LASIX) injection 20 mg  20 mg Intravenous Once Belkys A Regalado, MD      . LORazepam (ATIVAN) tablet 1 mg  1 mg Oral Q6H PRN Elmarie Shiley, MD       Or  . LORazepam (ATIVAN) injection 1 mg  1 mg Intravenous Q6H PRN Belkys A Regalado, MD      . morphine 2 MG/ML injection 1 mg  1 mg Intravenous Q4H PRN Belkys A Regalado, MD      . multivitamin with minerals tablet 1 tablet  1 tablet Oral Daily Belkys A Regalado, MD   1 tablet at 10/10/14 1900  . ondansetron (ZOFRAN) tablet 4 mg  4 mg Oral Q6H PRN Belkys A Regalado, MD       Or  . ondansetron (ZOFRAN) injection 4 mg  4 mg Intravenous Q6H PRN Belkys A Regalado, MD      . oxyCODONE (Oxy IR/ROXICODONE) immediate release tablet 5 mg  5 mg Oral Q4H PRN Belkys A Regalado, MD   5 mg at 10/10/14 2220  . pantoprazole (PROTONIX) 80 mg in sodium chloride 0.9 % 250 mL (0.32 mg/mL) infusion  8 mg/hr Intravenous Continuous Belkys A Regalado, MD 25 mL/hr at 10/10/14 2059 8 mg/hr at 10/10/14 2059  . [START ON 10/14/2014] pantoprazole (PROTONIX) injection 40 mg  40 mg Intravenous Q12H Belkys A Regalado, MD      . polyvinyl alcohol (LIQUIFILM TEARS) 1.4 %  ophthalmic solution 1 drop  1 drop Right Eye Daily PRN Belkys A Regalado, MD      . sodium chloride 0.9 % injection 3 mL  3 mL Intravenous Q12H Belkys A Regalado, MD   3 mL at 10/10/14 2200  . sodium chloride 0.9 % injection 3 mL  3 mL Intravenous PRN Belkys A Regalado, MD      . Derrill Memo ON 10/11/2014] spironolactone (ALDACTONE) tablet 25 mg  25 mg Oral Daily Belkys A  Regalado, MD      . thiamine (VITAMIN B-1) tablet 100 mg  100 mg Oral Daily Belkys A Regalado, MD       Or  . thiamine (B-1) injection 100 mg  100 mg Intravenous Daily Belkys A Regalado, MD   100 mg at 10/10/14 2139    Allergies as of 10/10/2014 - Review Complete 10/10/2014  Allergen Reaction Noted  . Aspirin Other (See Comments)     Family History  Problem Relation Age of Onset  . Hypertension Father   . Cancer Mother   . Stroke Mother     History   Social History  . Marital Status: Married    Spouse Name: N/A  . Number of Children: N/A  . Years of Education: N/A   Occupational History  . Not on file.   Social History Main Topics  . Smoking status: Former Smoker    Types: Cigarettes    Quit date: 08/16/2005  . Smokeless tobacco: Never Used  . Alcohol Use: 50.4 oz/week    84 Cans of beer per week     Comment: 12 cans beer per day  . Drug Use: No  . Sexual Activity: Yes   Other Topics Concern  . Not on file   Social History Narrative    Review of Systems: negative for upper tract dyspeptic symptoms, chest pain, shortness of breath, cough, hematuria.  Positive for back pain, scrotal swelling, easy bruising.  Physical Exam: Vital signs in last 24 hours: Temp:  [97.2 F (36.2 C)-97.9 F (36.6 C)] 97.7 F (36.5 C) (07/28 2034) Pulse Rate:  [94-106] 95 (07/28 2200) Resp:  [12-20] 13 (07/28 2200) BP: (106-158)/(57-87) 106/57 mmHg (07/28 2200) SpO2:  [86 %-100 %] 86 % (07/28 2200) Weight:  [85.9 kg (189 lb 6 oz)] 85.9 kg (189 lb 6 oz) (07/28 2034)   General:   Alert,  Well-developed, well-nourished,  pleasant and cooperative in NAD.  Tremulous but not encephalopathic. Head:  Normocephalic and atraumatic. Eyes:  Sclera clear, no icterus.   Conjunctiva pink. Mouth:   No ulcerations or lesions.  Oropharynx pink & moist. Neck:   No masses or thyromegaly. Lungs:  Decreased breath sounds especially at left base.   No wheezes, crackles, or rhonchi. No evident respiratory distress. Heart:   Regular rate and rhythm; no murmurs, clicks, rubs,  or gallops. Abdomen:  Soft, nontender, nontympanitic, and protruberant with flank dullness suggesting ascites. No masses, hepatosplenomegaly or ventral hernias noted.  Small umbilical hernia. Normal bowel sounds, without bruits, guarding, or rebound.   Msk:   Symmetrical without gross deformities. Pulses:  Normal radial pulse is noted. Extremities:   Without clubbing, cyanosis; 2-3+ lower extremity edema. Neurologic:  Alert and coherent;  grossly normal neurologically except tremulous. Skin:  Multiple small ecchymoses. No petechiae noted. Cervical Nodes:  No significant cervical adenopathy. Psych:   Alert and cooperative. Normal mood and affect, but tremulous suggestive of early alcohol withdrawal. GU: Significant scrotal enlargement with apparent right-sided hydrocele.  Intake/Output from previous day:   Intake/Output this shift: Total I/O In: 364.4 [I.V.:25.4; Blood:188.9; IV Piggyback:150] Out: 1640 [Urine:1640]  Lab Results:  Recent Labs  10/10/14 1528  WBC 1.2*  HGB 8.1*  HCT 23.7*  PLT 13*   BMET  Recent Labs  10/10/14 1528  NA 133*  K 4.1  CL 99*  CO2 27  GLUCOSE 114*  BUN 7  CREATININE 0.57*  CALCIUM 8.4*   LFT  Recent Labs  10/10/14 1528  PROT 7.8  ALBUMIN 2.5*  AST 184*  ALT 42  ALKPHOS 145*  BILITOT 1.5*   PT/INR  Recent Labs  10/10/14 1528  LABPROT 18.1*  INR 1.49    Studies/Results: Dg Chest 2 View  10/10/2014   CLINICAL DATA:  Cough.  Shortness breath.  Weakness and fatigue.  EXAM: CHEST - 2 VIEW   COMPARISON:  Two-view chest x-ray 05/10/2014.  FINDINGS: The heart size is upper limits of normal. Mild pulmonary vascular congestion is noted. Small bilateral pleural effusions are present, left greater than right. There is mild associated airspace disease likely reflecting atelectasis the upper lung fields are clear. The visualized soft tissues and bony thorax are remarkable for cervical spine surgery. Exaggerated thoracic kyphosis is noted.  IMPRESSION: 1. Borderline cardiomegaly with pulmonary vascular congestion and new bilateral effusions is concerning for early congestive heart failure. 2. Mild bibasilar airspace disease likely reflects atelectasis associated with the effusions.   Electronically Signed   By: San Morelle M.D.   On: 10/10/2014 15:49    Impression: 1. Low-grade GI bleeding. Suspect diffuse mucosal oozing related to profound thrombocytopenia, although non-steroidal gastropathy or portal hypertensive gastropathy would be other possibilities. 2. Fluid retention (chest, abdomen, lower extremities),? Due to portal hypertension 3. Early alcohol abstinence syndrome? 4. Pancytopenia.? Underlying hematologic abnormality versus profound bone marrow suppression from alcohol 5. 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 Observation and supportive care with respect to bleeding. The patient should probably have endoscopy when his medical condition permits, but I would be reluctant to do a non-essential endoscopic evaluation in a patient with an absolute neutrophil count under 1000, or a platelet count under 40,000. Fortunately, the patient is not showing evidence of active, destabilizing bleeding at this time, so our hand is not forced to do endoscopic intervention. Agree with PPI therapy in the meantime.  2. I would favor an updated CT scan (unless there is a recent one available on the [facility] PACS  system, which are radiologists can access),  to make sure that he does not have a pancreatic lesion that has evolved over the past 2 years, and also, to get a better feel for whether he has progressed to cirrhosis since his last exam 2 years ago.  3. He will need precautions against alcohol withdrawal  4. I would favor hematologic consultation, ideally obtaining records from his hematologist in [facility].   LOS: 0 days   Josimar Corning V  10/10/2014, 10:45 PM   Pager [phone] If no answer or after 5 PM call [phone]

## 2014-10-10 NOTE — ED Notes (Signed)
MD/RN given abnormal lab results

## 2014-10-10 NOTE — H&P (Addendum)
[facility] History and Physical  Townsend Cudworth SEG:[id number] DOB: 08-07-1955 DOA: 10/10/2014  Referring physician: Dr Tamera Punt PCP: Woody Seller, MD   Chief Complaint: SOB, rectal bleeding.   HPI: [name] is a 59 y.o. male with PMH significant for alcohol abuse, Cirrhosis of liver pancreatitis, right hydrocele who was refer by PCP due to abnormal labs. Patient went to see PCP due to worsening SOB. PCP obtain lab work which showed low platelet. He was refer for admission to the ED. He report Bright red blood per rectum 5 days prior to admission. He relates Black tarry stool, twice a day for last few days. Last black BM  was this am.  Rectal exam perform by Dr Tamera Punt showed small amount of blood. He is alert, able to answer questions. He continue to drinks alcohol,6 beers per day. Last drink was early today.; he relates mild abdominal pain, and worsening abdominal distension and lower extremity edema.   Evaluation in the ED: Chest x ray with pulmonary congestion and bilateral pleural effusion, BNP at 120, Alcohol level 256, Platelet count at 13, Hb at 8.  2 units of platelet has been ordered.   Review of Systems:  Negative, except as per HPI.   Past Medical History  Diagnosis Date  . GERD (gastroesophageal reflux disease)   . 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 Transfusion history 08-16-12    '99  . Pancreatitis 08-16-12    at present weakness,fatiques easily  . Foreign body 08-16-12    nose"BB" pellet since age 29  . History of ETOH abuse 08-16-12    Heavy alcohol use daily  . Anemia   . Anxiety   . Clotting disorder   . Substance abuse   . Cirrhosis    Past Surgical History  Procedure Laterality Date  . Neck surgery      fusion of neck-titanium hardware inplaced  . Esophagogastroduodenoscopy    . Colonoscopy    . Eus N/A 08/18/2012    Procedure: UPPER ENDOSCOPIC ULTRASOUND (EUS) LINEAR;  Surgeon: Beryle Beams, MD;  Location: [facility];  Service:  Endoscopy;  Laterality: N/A;  . Neck surgery  [phone]   Social History:  reports that he quit smoking about 9 years ago. His smoking use included Cigarettes. He has never used smokeless tobacco. He reports that he drinks about 50.4 oz of alcohol per week. He reports that he does not use illicit drugs.  Allergies  Allergen Reactions  . Aspirin Other (See Comments)    Has upset stomach in high doses only. Took 4 aspirin with EMS, no reaction.    Family History  Problem Relation Age of Onset  . Hypertension Father   . Cancer Mother   . Stroke Mother     Prior to Admission medications   Medication Sig Start Date End Date Taking? Authorizing Provider  baclofen (LIORESAL) 10 MG tablet Take 1-2 mg by mouth at bedtime as needed for muscle spasms.   Yes Historical Provider, MD  calcium carbonate (TUMS - DOSED IN MG ELEMENTAL CALCIUM) 500 MG chewable tablet Chew 1 tablet by mouth daily.   Yes Historical Provider, MD  folic acid (FOLVITE) 1 MG tablet Take 1 tablet (1 mg total) by mouth daily. 08/05/14  Yes Modena Jansky, MD  furosemide (LASIX) 80 MG tablet Take 1 tablet (80 mg total) by mouth daily. 08/05/14  Yes Modena Jansky, MD  Hypromellose (ARTIFICIAL TEARS OP) Place 2 drops into the right eye daily as needed (  irritation). For redness/itchy   Yes Historical Provider, MD  naproxen sodium (ANAPROX) 220 MG tablet Take 440 mg by mouth daily as needed (pain).   Yes Historical Provider, MD  spironolactone (ALDACTONE) 25 MG tablet Take 2 tablets (50 mg total) by mouth daily. Patient taking differently: Take 25 mg by mouth daily.  08/05/14  Yes Modena Jansky, MD  thiamine 100 MG tablet Take 1 tablet (100 mg total) by mouth daily. 08/05/14  Yes Modena Jansky, MD  acetaminophen (TYLENOL) 325 MG tablet Take 2 tablets (650 mg total) by mouth every 8 (eight) hours as needed for mild pain, moderate pain, fever or headache (or Fever >/= 101). 08/05/14   Modena Jansky, MD   Physical Exam: Filed  Vitals:   10/10/14 1445 10/10/14 1805  BP: 127/66 111/67  Pulse: 106 98  Temp: 97.6 F (36.4 C) 97.9 F (36.6 C)  TempSrc: Oral Oral  Resp:  18  SpO2: 98% 98%    Wt Readings from Last 3 Encounters:  08/05/14 78.382 kg (172 lb 12.8 oz)  08/18/12 79.379 kg (175 lb)  04/01/12 78.926 kg (174 lb)    General:  Appears calm and comfortable, NAD. Eyes: PERRL, normal lids, irises & conjunctiva ENT: grossly normal hearing, lips & tongue Neck: no LAD, masses or thyromegaly Cardiovascular: RRR, no m/r/g. Bilateral plus 2 LE edema, up to thigh.  Telemetry: SR, no arrhythmias  Respiratory: fine crackles bases,  Normal respiratory effort. Abdomen: soft, distended, mild tenderness, no rigidity Skin: echymosis, petechia rash LE.  Musculoskeletal: grossly normal tone BUE/BLE Psychiatric: grossly normal mood and affect, speech fluent and appropriate Neurologic: grossly non-focal.          Labs on Admission:  Basic Metabolic Panel:  Recent Labs Lab 10/10/14 1528  NA 133*  K 4.1  CL 99*  CO2 27  GLUCOSE 114*  BUN 7  CREATININE 0.57*  CALCIUM 8.4*   Liver Function Tests:  Recent Labs Lab 10/10/14 1528  AST 184*  ALT 42  ALKPHOS 145*  BILITOT 1.5*  PROT 7.8  ALBUMIN 2.5*   No results for input(s): LIPASE, AMYLASE in the last 168 hours. No results for input(s): AMMONIA in the last 168 hours. CBC:  Recent Labs Lab 10/10/14 1528  WBC 1.2*  NEUTROABS 0.5*  HGB 8.1*  HCT 23.7*  MCV 103.5*  PLT 13*   Cardiac Enzymes: No results for input(s): CKTOTAL, CKMB, CKMBINDEX, TROPONINI in the last 168 hours.  BNP (last 3 results) No results for input(s): BNP in the last 8760 hours.  ProBNP (last 3 results) No results for input(s): PROBNP in the last 8760 hours.  CBG: No results for input(s): GLUCAP in the last 168 hours.  Radiological Exams on Admission: Dg Chest 2 View  10/10/2014   CLINICAL DATA:  Cough.  Shortness breath.  Weakness and fatigue.  EXAM: CHEST - 2  VIEW  COMPARISON:  Two-view chest x-ray 05/10/2014.  FINDINGS: The heart size is upper limits of normal. Mild pulmonary vascular congestion is noted. Small bilateral pleural effusions are present, left greater than right. There is mild associated airspace disease likely reflecting atelectasis the upper lung fields are clear. The visualized soft tissues and bony thorax are remarkable for cervical spine surgery. Exaggerated thoracic kyphosis is noted.  IMPRESSION: 1. Borderline cardiomegaly with pulmonary vascular congestion and new bilateral effusions is concerning for early congestive heart failure. 2. Mild bibasilar airspace disease likely reflects atelectasis associated with the effusions.   Electronically Signed   By:  San Morelle M.D.   On: 10/10/2014 15:49    EKG: Independently reviewed. Sinus rhythm, right axis deviation.   Assessment/Plan Active Problems:   Anemia   Alcohol abuse   Thrombocytopenia   GI bleed   Cirrhosis of liver   Pulmonary edema   Pancytopenia  1-Melena, GI bleed:  In setting of thrombocytopenia. History of cirrhosis.  2 units of platelet has been ordered. Will need to repeat labs after and transfuse as needed. Suspect he will need further transfusion.  I will order also 1 unit of PRBC.  Patient vitals sign are stable.  Will start IV protonix Gtt.  Will start Ceftriaxone to cover for SBP.  GI consulted. [facility].  NPO.   2-Cirrhosis liver:  Ceftriaxone for SBP prophylasix.  Resume lasix in am if BP stable and no GI bleed.   3-Acute Pulmonary edema; in setting of cirrhosis.  His oxygen saturation is at 96 on 2 L.  He has received 20 mg IV lasix.  Will give another 20 after platelet transfusion.  Careful with diuresis in  setting of GI bleed.   Alcohol abuse: Monitor for withdrawal.  Ciwa protocol ordered.  Thiamine and folate.  Counseling provided.  Hyponatremia; in setting of hypervolemia and cirrhosis.  Lasix IV ordered.   Pancytopenia:  Presume to Bone marrow suppression in the stting of alcohol abuse.  Platelet transfusion.   Anasarca; he will need further diuresis.  Code Status: Full code.  DVT Prophylaxis SCD in setting of thrombocytopenia and GI bleed.  Family Communication: care discussed with wife.  Disposition Plan: expect 3 to 4 days inpatient.   Time spent: 75 minutes.   Niel Hummer A [facility] Pager [phone]

## 2014-10-11 DIAGNOSIS — A419 Sepsis, unspecified organism: Secondary | ICD-10-CM

## 2014-10-11 LAB — URINALYSIS, ROUTINE W REFLEX MICROSCOPIC
Bilirubin Urine: NEGATIVE
Glucose, UA: NEGATIVE mg/dL
KETONES UR: NEGATIVE mg/dL
LEUKOCYTES UA: NEGATIVE
Nitrite: NEGATIVE
PH: 6 (ref 5.0–8.0)
PROTEIN: NEGATIVE mg/dL
Specific Gravity, Urine: 1.012 (ref 1.005–1.030)
UROBILINOGEN UA: 0.2 mg/dL (ref 0.0–1.0)

## 2014-10-11 LAB — COMPREHENSIVE METABOLIC PANEL
ALT: 38 U/L (ref 17–63)
ANION GAP: 6 (ref 5–15)
AST: 159 U/L — ABNORMAL HIGH (ref 15–41)
Albumin: 2.4 g/dL — ABNORMAL LOW (ref 3.5–5.0)
Alkaline Phosphatase: 123 U/L (ref 38–126)
BUN: 7 mg/dL (ref 6–20)
CO2: 30 mmol/L (ref 22–32)
Calcium: 8.3 mg/dL — ABNORMAL LOW (ref 8.9–10.3)
Chloride: 101 mmol/L (ref 101–111)
Creatinine, Ser: 0.58 mg/dL — ABNORMAL LOW (ref 0.61–1.24)
GFR calc non Af Amer: >60 mL/min (ref >60)
Glucose, Bld: 82 mg/dL (ref 65–99)
Potassium: 3.8 mmol/L (ref 3.5–5.1)
Sodium: 137 mmol/L (ref 135–145)
Total Bilirubin: 1.6 mg/dL — ABNORMAL HIGH (ref 0.3–1.2)
Total Protein: 7.3 g/dL (ref 6.5–8.1)

## 2014-10-11 LAB — TYPE AND SCREEN
ABO/RH(D): A POS
Antibody Screen: NEGATIVE
Unit division: 0

## 2014-10-11 LAB — CBC
HCT: 24.4 % — ABNORMAL LOW (ref 39.0–52.0)
HEMATOCRIT: 24.7 % — AB (ref 39.0–52.0)
Hemoglobin: 8.4 g/dL — ABNORMAL LOW (ref 13.0–17.0)
Hemoglobin: 8.6 g/dL — ABNORMAL LOW (ref 13.0–17.0)
MCH: 35.7 pg — ABNORMAL HIGH (ref 26.0–34.0)
MCH: 36.4 pg — ABNORMAL HIGH (ref 26.0–34.0)
MCHC: 34.4 g/dL (ref 30.0–36.0)
MCHC: 34.8 g/dL (ref 30.0–36.0)
MCV: 103.8 fL — ABNORMAL HIGH (ref 78.0–100.0)
MCV: 104.7 fL — ABNORMAL HIGH (ref 78.0–100.0)
PLATELETS: 34 10*3/uL — AB (ref 150–400)
Platelets: 36 10*3/uL — ABNORMAL LOW (ref 150–400)
RBC: 2.35 MIL/uL — ABNORMAL LOW (ref 4.22–5.81)
RBC: 2.36 MIL/uL — AB (ref 4.22–5.81)
RDW: 14.6 % (ref 11.5–15.5)
RDW: 14.8 % (ref 11.5–15.5)
WBC: 0.9 10*3/uL — AB (ref 4.0–10.5)
WBC: 1.1 10*3/uL — CL (ref 4.0–10.5)

## 2014-10-11 LAB — LACTIC ACID, PLASMA
LACTIC ACID, VENOUS: 0.9 mmol/L (ref 0.5–2.0)
Lactic Acid, Venous: 1.1 mmol/L (ref 0.5–2.0)

## 2014-10-11 LAB — URINE MICROSCOPIC-ADD ON

## 2014-10-11 LAB — MRSA PCR SCREENING: MRSA by PCR: NEGATIVE

## 2014-10-11 LAB — PATHOLOGIST SMEAR REVIEW

## 2014-10-11 LAB — PROCALCITONIN

## 2014-10-11 MED ORDER — INSULIN ASPART 100 UNIT/ML ~~LOC~~ SOLN: 0.0000 [IU] | Freq: Three times a day (TID) | SUBCUTANEOUS | Status: DC

## 2014-10-11 MED ORDER — FUROSEMIDE 10 MG/ML IJ SOLN
20.0000 mg | Freq: Two times a day (BID) | INTRAMUSCULAR | Status: DC
  Administered 2014-10-11 – 2014-10-14 (×6): 20 mg via INTRAVENOUS
  Filled 2014-10-11 (×6): qty 2

## 2014-10-11 MED ORDER — CETYLPYRIDINIUM CHLORIDE 0.05 % MT LIQD
7.0000 mL | Freq: Two times a day (BID) | OROMUCOSAL | Status: DC
  Administered 2014-10-11 – 2014-10-17 (×8): 7 mL via OROMUCOSAL

## 2014-10-11 NOTE — Care Management Note (Signed)
Case Management Note  Patient Details  Name: [name] MRN: [id number] Date of Birth: [birth year]  Subjective/Objective:                  Gi bleed, etoh abuse,hypotension  Action/Plan: Date:  October 11, 2014 U.R. performed for needs and level of care. Will continue to follow for Case Management needs.  Velva Harman, RN, BSN, [place]   [phone]  Expected Discharge Date:   (unknown)               Expected Discharge Plan:  Home/Self Care  In-House Referral:  Clinical Social Work  Discharge planning Services  CM Consult  Post Acute Care Choice:  NA Choice offered to:     DME Arranged:  N/A DME Agency:  NA  HH Arranged:  NA HH Agency:  NA  Status of Service:  In process, will continue to follow  Medicare Important Message Given:    Date Medicare IM Given:    Medicare IM give by:    Date Additional Medicare IM Given:    Additional Medicare Important Message give by:     If discussed at [place] of Stay Meetings, dates discussed:    Additional Comments:  Leeroy Cha, RN 10/11/2014, 10:42 AM

## 2014-10-11 NOTE — Progress Notes (Signed)
[facility] PROGRESS NOTE Subjective patient reports that he had bone marrow evaluation in [place] by hematologist and was told that all of his bone marrow problems were due to excessive alcohol. He drinks 12 years or more daily. Feels a little shaky this morning. No gross bleeding overnight reports that he is hungry.  Objective: Vital signs in last 24 hours: Temp:  [97.2 F (36.2 C)-98.4 F (36.9 C)] 98.2 F (36.8 C) (07/29 0322) Pulse Rate:  [87-106] 93 (07/29 0600) Resp:  [11-20] 12 (07/29 0600) BP: (106-158)/(57-87) 127/65 mmHg (07/29 0600) SpO2:  [86 %-100 %] 96 % (07/29 0600) Weight:  [85.9 kg (189 lb 6 oz)] 85.9 kg (189 lb 6 oz) (07/28 2034)    Intake/Output from previous day: 07/28 0701 - 07/29 0700 In: 1073.6 [I.V.:225.4; Blood:698.2; IV Piggyback:150] Out: 5427 [Urine:3240] Intake/Output this shift: Total I/O In: 1073.6 [I.V.:225.4; Blood:698.2; IV Piggyback:150] Out: 0623 [Urine:3240]  PE: General-- alert and oriented, somewhat shaky  Abdomen-- slight upper abdominal tenderness  Lab Results:  Recent Labs  10/10/14 1528 10/11/14 0505  WBC 1.2* 0.9*  HGB 8.1* 8.4*  HCT 23.7* 24.4*  PLT 13* 36*   BMET  Recent Labs  10/10/14 1528 10/11/14 0505  NA 133* 137  K 4.1 3.8  CL 99* 101  CO2 27 30  CREATININE 0.57* 0.58*   LFT  Recent Labs  10/10/14 1528 10/11/14 0505  PROT 7.8 7.3  AST 184* 159*  ALT 42 38  ALKPHOS 145* 123  BILITOT 1.5* 1.6*   PT/INR  Recent Labs  10/10/14 1528  LABPROT 18.1*  INR 1.49   PANCREAS No results for input(s): LIPASE in the last 72 hours.       Studies/Results: Dg Chest 2 View  10/10/2014   CLINICAL DATA:  Cough.  Shortness breath.  Weakness and fatigue.  EXAM: CHEST - 2 VIEW  COMPARISON:  Two-view chest x-ray 05/10/2014.  FINDINGS: The heart size is upper limits of normal. Mild pulmonary vascular congestion is noted. Small bilateral pleural effusions are present, left greater than right.  There is mild associated airspace disease likely reflecting atelectasis the upper lung fields are clear. The visualized soft tissues and bony thorax are remarkable for cervical spine surgery. Exaggerated thoracic kyphosis is noted.  IMPRESSION: 1. Borderline cardiomegaly with pulmonary vascular congestion and new bilateral effusions is concerning for early congestive heart failure. 2. Mild bibasilar airspace disease likely reflects atelectasis associated with the effusions.   Electronically Signed   By: San Morelle M.D.   On: 10/10/2014 15:49    Medications: I have reviewed the patient's current medications.  Assessment/Plan: 1. G.I. bleeding. Probably primarily due to low platelets. He also has been on NSAIDs. At some point he may need EGD. This does not appear to be a variceal bleeding. At this point I would treat him conservatively until he is over his alcohol withdrawal, consider elective EGD at some point in the future 2. Bone marrow suppression. He's neutropenia can thrombocytopenic and this is improving with abstinence. This apparently has been worked up in the past and felt to be result of alcohol. Hopefully abstinence will result in rapid improvement is bone marrow function.  We will follow with you. I would go ahead and feed him at this time, let him get through his alcohol withdrawal, allows bone marrow function to improve and consider elective EGD at that time is still appropriate.   Ledonna Dormer JR,Esbeydi Manago L 10/11/2014, 6:51 AM  Pager: [phone] If no answer or after hours  call [phone]

## 2014-10-11 NOTE — Progress Notes (Addendum)
Patient ID: [name], male   DOB: May 03, 1955, 59 y.o.   MRN: [id number] [facility] PROGRESS NOTE  [name] RAX:[id number] DOB: Jan 17, 1956 DOA: 10/10/2014 PCP: Woody Seller, MD  Brief narrative:    59 y.o. male with past medical history of alcohol abuse, liver cirrhosis, alcohol related pancreatitis. Pt presented to PCP due to worsening shortness of breath and was found to have abnormal blood work specifically low platelets. He was referred to ED for evaluation. Pt reported having dark stools for past 1 week prior to this admission. He has been taking Aleve for chronic back pain.  On admission, he was hemodynamically stable. His oxygen saturation was 86% on room air which has improved to 98-100% on [place] oxygen support. His blood work revealed WBC count 1.2, hemoglobin 8.1, platelets 13, sodium 133, normal creatinine. CXR demonstrated borderline cardiomegaly with pulmonary vascular congestion. He has received lasix 20 mg IV x 2 in ED. GI has seen him in consultation and plans on observation for now. His platelet count is too low for any diagnostic procedures at this point so observation until CBC stable is appropriate.   Assessment/Plan:    Principal Problem: Acute blood loss anemia / Anemia of chronic disease / Upper GI bleed / thrombocytopenia - Likely from alcohol related liver injury - Hemoglobin is 8.6 status post 1 unit PRBC transfusion - Platelets 13 on admission and now 34 status post 2 units of platelets - Continue protonix drip and PPI therapy Q 12 hours IV - Appreciate GI following and their recommendations  - Follow up daily CBC  Active Problems: Liver cirrhosis / Anasarca - Will start lasix 20 mg IV Q 12 hours - Continue spironolactone - Continue Ceftriaxone for SBP prophylasix.   Neutropenia / sepsis, unspecified organism - Sepsis criteria met at least at this time so sepsis protocol initiated. He had tachycardia, tachypnea, neutropenia.  - Check Blood  and urine cultures, urinalysis, procalcitonin and lactic acid level - Continue ceftriaxone   Acute Pulmonary edema in setting of cirrhosis / Acute respiratory failure with hypoxia - Continue IV lasix - Continue oxygen support via [place] to keep O2 sats above 90%  Acute alcohol intoxication  - Monitor for withdrawal.  - CIWA protocol ordered.  - Ethanol 256 on admission   Hyponatremia - Secondary to liver cirrhosis - Sodium normalized    Pancytopenia - Secondary to bone marrow suppression from alcohol abuse - Continue to check daily CBC   DVT Prophylaxis  - SCD's bilaterally    Code Status: Full.  Family Communication:  plan of care discussed with the patient Disposition Plan: remains in SDU because he is on protonix drip   IV access:  Peripheral IV  Procedures and diagnostic studies:    Dg Chest 2 View 10/10/2014   1. Borderline cardiomegaly with pulmonary vascular congestion and new bilateral effusions is concerning for early congestive heart failure. 2. Mild bibasilar airspace disease likely reflects atelectasis associated with the effusions.    Medical Consultants:  Gastroenterology  Other Consultants:  Nutrition  IAnti-Infectives:   Rocephin 10/10/2014 -->    Leisa Lenz, MD  [facility] Pager [phone]  Time spent in minutes: 25 minutes  If 7PM-7AM, please contact night-coverage [email or web address] Password [email or web address] 10/11/2014, 4:18 PM   LOS: 1 day    HPI/Subjective: No acute overnight events. Patient reports feeling nausea but no vomiting.   Objective: Filed Vitals:   10/11/14 1200 10/11/14 1300 10/11/14 1400 10/11/14 1500  BP: 149/72  148/72   Pulse: 95  109 96 100  Temp: 97.8 F (36.6 C)     TempSrc: Oral     Resp: '12 22 13 14  ' Height:      Weight:      SpO2: 96% 92% 96% 97%    Intake/Output Summary (Last 24 hours) at 10/11/14 1618 Last data filed at 10/11/14 1500  Gross per 24 hour  Intake 1628.6 ml  Output   3965 ml  Net -2336.4 ml     Exam:   General:  Pt is alert, follows commands appropriately, not in acute distress  Cardiovascular: Regular rate and rhythm, S1/S2 (+)  Respiratory: Clear to auscultation bilaterally, no wheezing, no crackles, no rhonchi  Abdomen: Soft, non tender, non distended, bowel sounds present  Extremities: +1 LE edema, pulses DP and PT palpable bilaterally  Neuro: Grossly nonfocal  Data Reviewed: Basic Metabolic Panel:  Recent Labs Lab 10/10/14 1528 10/11/14 0505  NA 133* 137  K 4.1 3.8  CL 99* 101  CO2 27 30  GLUCOSE 114* 82  BUN 7 7  CREATININE 0.57* 0.58*  CALCIUM 8.4* 8.3*   Liver Function Tests:  Recent Labs Lab 10/10/14 1528 10/11/14 0505  AST 184* 159*  ALT 42 38  ALKPHOS 145* 123  BILITOT 1.5* 1.6*  PROT 7.8 7.3  ALBUMIN 2.5* 2.4*   No results for input(s): LIPASE, AMYLASE in the last 168 hours. No results for input(s): AMMONIA in the last 168 hours. CBC:  Recent Labs Lab 10/10/14 1528 10/11/14 0505 10/11/14 1205  WBC 1.2* 0.9* 1.1*  NEUTROABS 0.5*  --   --   HGB 8.1* 8.4* 8.6*  HCT 23.7* 24.4* 24.7*  MCV 103.5* 103.8* 104.7*  PLT 13* 36* 34*   Cardiac Enzymes: No results for input(s): CKTOTAL, CKMB, CKMBINDEX, TROPONINI in the last 168 hours. BNP: Invalid input(s): POCBNP CBG: No results for input(s): GLUCAP in the last 168 hours.  Recent Results (from the past 240 hour(s))  MRSA PCR Screening     Status: None   Collection Time: 10/10/14 11:12 PM  Result Value Ref Range Status   MRSA by PCR NEGATIVE NEGATIVE Final     Scheduled Meds: . cefTRIAXone   2 g Intravenous Q24H  . folic acid  1 mg Oral Daily  . multivitamin   1 tablet Oral Daily  . (PROTONIX) IV  40 mg Intravenous Q12H  . spironolactone  25 mg Oral Daily  . thiamine  100 mg Oral Daily   Continuous Infusions: . pantoprozole (PROTONIX) infusion 8 mg/hr (10/11/14 1545)

## 2014-10-12 LAB — CBC
HCT: 26 % — ABNORMAL LOW (ref 39.0–52.0)
HEMATOCRIT: 24.4 % — AB (ref 39.0–52.0)
HEMATOCRIT: 26.3 % — AB (ref 39.0–52.0)
HEMOGLOBIN: 8.3 g/dL — AB (ref 13.0–17.0)
HEMOGLOBIN: 8.8 g/dL — AB (ref 13.0–17.0)
Hemoglobin: 8.9 g/dL — ABNORMAL LOW (ref 13.0–17.0)
MCH: 35.6 pg — ABNORMAL HIGH (ref 26.0–34.0)
MCH: 35.8 pg — AB (ref 26.0–34.0)
MCH: 35.3 pg — ABNORMAL HIGH (ref 26.0–34.0)
MCHC: 34 g/dL (ref 30.0–36.0)
MCHC: 33.8 g/dL (ref 30.0–36.0)
MCHC: 33.8 g/dL (ref 30.0–36.0)
MCV: 104.7 fL — AB (ref 78.0–100.0)
MCV: 105.7 fL — ABNORMAL HIGH (ref 78.0–100.0)
MCV: 104.4 fL — ABNORMAL HIGH (ref 78.0–100.0)
PLATELETS: 32 10*3/uL — AB (ref 150–400)
Platelets: 28 10*3/uL — CL (ref 150–400)
Platelets: 32 10*3/uL — ABNORMAL LOW (ref 150–400)
RBC: 2.33 MIL/uL — ABNORMAL LOW (ref 4.22–5.81)
RBC: 2.46 MIL/uL — ABNORMAL LOW (ref 4.22–5.81)
RBC: 2.52 MIL/uL — ABNORMAL LOW (ref 4.22–5.81)
RDW: 14.5 % (ref 11.5–15.5)
RDW: 14.3 % (ref 11.5–15.5)
RDW: 14.1 % (ref 11.5–15.5)
WBC: 1.1 10*3/uL — AB (ref 4.0–10.5)
WBC: 1.6 10*3/uL — ABNORMAL LOW (ref 4.0–10.5)
WBC: 1.6 10*3/uL — AB (ref 4.0–10.5)

## 2014-10-12 NOTE — Progress Notes (Signed)
Patient ID: [name], male   DOB: 07/02/1955, 59 y.o.   MRN: [id number] [facility] PROGRESS NOTE  [name] NWG:[id number] DOB: 10-01-1955 DOA: 10/10/2014 PCP: Woody Seller, MD  Brief narrative:    59 y.o. male with past medical history of alcohol abuse, liver cirrhosis, alcohol related pancreatitis. Pt presented to PCP due to worsening shortness of breath and was found to have abnormal blood work specifically low platelets. He was referred to ED for evaluation. Pt reported having dark stools for past 1 week prior to this admission. He has been taking Aleve for chronic back pain.  On admission, he was hemodynamically stable. His oxygen saturation was 86% on room air which has improved to 98-100% on [place] oxygen support. His blood work revealed WBC count 1.2, hemoglobin 8.1, platelets 13, sodium 133, normal creatinine. CXR demonstrated borderline cardiomegaly with pulmonary vascular congestion. He has received lasix 20 mg IV x 2 in ED. GI has seen him in consultation and plans on observation for now. His platelet count is too low for any diagnostic procedures at this point so observation until CBC stable is appropriate.  Barrier to discharge: Transfer to telemetry today.   Assessment/Plan:    Principal Problem: Acute blood loss anemia / Anemia of chronic disease / Upper GI bleed / thrombocytopenia - Likely from alcohol related liver injury - Pt received 1 unit PRBC during this hospital stay - His Hgb this am is 8.3 - Platelets 13 on admission and then 34 status post 2 units of platelets. This am platelets 28. - Continue protonix drip and PPI therapy Q 12 hours IV - Appreciate GI following and their recommendations  - Continue to monitor CBC daily   Active Problems: Liver cirrhosis / Anasarca - Continue lasix 20 mg IV Q 12 hours - Weight in past 24 hours: 85.9 kg --> 84.5 kg  - Continue spironolactone - Continue Ceftriaxone for SBP prophylasix.   Neutropenia /  sepsis, unspecified organism - Sepsis criteria met with tachycardia, tachypnea, neutropenia. UA unremarkable. Blood and urine culture penidng. - Lactic acid and procalcitonin level WNL - Continue ceftriaxone for SBP prophylaxis   Acute Pulmonary edema in setting of cirrhosis / Acute respiratory failure with hypoxia - Continue IV lasix - Continue oxygen support via [place] to keep O2 sats above 90%  Acute alcohol intoxication  - Monitor for withdrawal.  - No reports of withdrawals  - Continue CIWA protocol - Ethanol 256 on admission   Hyponatremia - Secondary to liver cirrhosis - Sodium normalized    Pancytopenia - Secondary to bone marrow suppression from alcohol abuse - Daily CBC   DVT Prophylaxis  - SCD's bilaterally    Code Status: Full.  Family Communication:  plan of care discussed with the patient Disposition Plan: transfer to telemetry today    IV access:  Peripheral IV  Procedures and diagnostic studies:    Dg Chest 2 View 10/10/2014   1. Borderline cardiomegaly with pulmonary vascular congestion and new bilateral effusions is concerning for early congestive heart failure. 2. Mild bibasilar airspace disease likely reflects atelectasis associated with the effusions.    Medical Consultants:  Gastroenterology  Other Consultants:  Nutrition  IAnti-Infectives:   Rocephin 10/10/2014 -->    Leisa Lenz, MD  [facility] Pager [phone]  Time spent in minutes: 25 minutes  If 7PM-7AM, please contact night-coverage [email or web address] Password [email or web address] 10/12/2014, 7:56 AM   LOS: 2 days    HPI/Subjective: No acute overnight events. Patient reports feeling nausea but no vomiting.  Objective: Filed Vitals:   10/11/14 2127 10/11/14 2339 10/12/14 0000 10/12/14 0400  BP: 144/65  137/76 130/76  Pulse: 94  98 90  Temp:  98.8 F (37.1 C)  98.5 F (36.9 C)  TempSrc:  Oral  Oral  Resp: '12  13 12  ' Height:      Weight:    84.5 kg (186 lb 4.6 oz)  SpO2: 98%  99% 99%     Intake/Output Summary (Last 24 hours) at 10/12/14 0756 Last data filed at 10/12/14 0700  Gross per 24 hour  Intake 1466.25 ml  Output   2425 ml  Net -958.75 ml    Exam:   General:  Pt is alert, not in acute distress  Cardiovascular: Rate controlled, (+) S1, S2  Respiratory: bilateral air entry, no wheezing   Abdomen: appreciate bowel sounds, tender across mid abdomen, no rebound   Extremities: +1 LE edema, pulses palpable   Neuro: No focal deficits   Data Reviewed: Basic Metabolic Panel:  Recent Labs Lab 10/10/14 1528 10/11/14 0505  NA 133* 137  K 4.1 3.8  CL 99* 101  CO2 27 30  GLUCOSE 114* 82  BUN 7 7  CREATININE 0.57* 0.58*  CALCIUM 8.4* 8.3*   Liver Function Tests:  Recent Labs Lab 10/10/14 1528 10/11/14 0505  AST 184* 159*  ALT 42 38  ALKPHOS 145* 123  BILITOT 1.5* 1.6*  PROT 7.8 7.3  ALBUMIN 2.5* 2.4*   No results for input(s): LIPASE, AMYLASE in the last 168 hours. No results for input(s): AMMONIA in the last 168 hours. CBC:  Recent Labs Lab 10/10/14 1528 10/11/14 0505 10/11/14 1205 10/11/14 2045 10/12/14 0416  WBC 1.2* 0.9* 1.1* 1.1* 1.1*  NEUTROABS 0.5*  --   --   --   --   HGB 8.1* 8.4* 8.6* 8.3* 8.3*  HCT 23.7* 24.4* 24.7* 24.9* 24.4*  MCV 103.5* 103.8* 104.7* 103.8* 104.7*  PLT 13* 36* 34* 28* 28*   Cardiac Enzymes: No results for input(s): CKTOTAL, CKMB, CKMBINDEX, TROPONINI in the last 168 hours. BNP: Invalid input(s): POCBNP CBG: No results for input(s): GLUCAP in the last 168 hours.  Recent Results (from the past 240 hour(s))  MRSA PCR Screening     Status: None   Collection Time: 10/10/14 11:12 PM  Result Value Ref Range Status   MRSA by PCR NEGATIVE NEGATIVE Final     Scheduled Meds: . cefTRIAXone   2 g Intravenous Q24H  . folic acid  1 mg Oral Daily  . multivitamin   1 tablet Oral Daily  . (PROTONIX) IV  40 mg Intravenous Q12H  . spironolactone  25 mg Oral Daily  . thiamine  100 mg Oral Daily    Continuous Infusions: . pantoprozole (PROTONIX) infusion 8 mg/hr (10/12/14 0700)

## 2014-10-13 DIAGNOSIS — K7031 Alcoholic cirrhosis of liver with ascites: Secondary | ICD-10-CM | POA: Insufficient documentation

## 2014-10-13 DIAGNOSIS — K922 Gastrointestinal hemorrhage, unspecified: Secondary | ICD-10-CM | POA: Insufficient documentation

## 2014-10-13 DIAGNOSIS — D62 Acute posthemorrhagic anemia: Secondary | ICD-10-CM | POA: Insufficient documentation

## 2014-10-13 LAB — CBC
HCT: 25.8 % — ABNORMAL LOW (ref 39.0–52.0)
HCT: 26.2 % — ABNORMAL LOW (ref 39.0–52.0)
HCT: 27 % — ABNORMAL LOW (ref 39.0–52.0)
HEMOGLOBIN: 9 g/dL — AB (ref 13.0–17.0)
Hemoglobin: 8.8 g/dL — ABNORMAL LOW (ref 13.0–17.0)
Hemoglobin: 9.3 g/dL — ABNORMAL LOW (ref 13.0–17.0)
MCH: 35.5 pg — ABNORMAL HIGH (ref 26.0–34.0)
MCH: 34.9 pg — ABNORMAL HIGH (ref 26.0–34.0)
MCH: 35.4 pg — ABNORMAL HIGH (ref 26.0–34.0)
MCHC: 34.1 g/dL (ref 30.0–36.0)
MCHC: 34.4 g/dL (ref 30.0–36.0)
MCHC: 34.4 g/dL (ref 30.0–36.0)
MCV: 104 fL — AB (ref 78.0–100.0)
MCV: 101.6 fL — ABNORMAL HIGH (ref 78.0–100.0)
MCV: 102.7 fL — ABNORMAL HIGH (ref 78.0–100.0)
PLATELETS: 25 10*3/uL — AB (ref 150–400)
Platelets: 26 10*3/uL — CL (ref 150–400)
Platelets: 40 10*3/uL — ABNORMAL LOW (ref 150–400)
RBC: 2.48 MIL/uL — AB (ref 4.22–5.81)
RBC: 2.58 MIL/uL — ABNORMAL LOW (ref 4.22–5.81)
RBC: 2.63 MIL/uL — ABNORMAL LOW (ref 4.22–5.81)
RDW: 14 % (ref 11.5–15.5)
RDW: 13.9 % (ref 11.5–15.5)
RDW: 13.8 % (ref 11.5–15.5)
WBC: 2.1 10*3/uL — AB (ref 4.0–10.5)
WBC: 2.8 10*3/uL — ABNORMAL LOW (ref 4.0–10.5)
WBC: 3.8 10*3/uL — ABNORMAL LOW (ref 4.0–10.5)

## 2014-10-13 LAB — URINE CULTURE: Culture: NO GROWTH

## 2014-10-13 NOTE — Progress Notes (Signed)
Patient ID: [name], male   DOB: 1955-07-26, 59 y.o.   MRN: [id number] [facility] PROGRESS NOTE  Geoge Lawrance WIO:[id number] DOB: [birth year] DOA: 10/10/2014 PCP: Woody Seller, MD  Brief narrative:    59 y.o. male with past medical history of alcohol abuse, liver cirrhosis, alcohol related pancreatitis. Pt presented to PCP due to worsening shortness of breath and was found to have abnormal blood work specifically low platelets. He was referred to ED for evaluation. Pt reported having dark stools for past 1 week prior to this admission. He has been taking Aleve for chronic back pain.  On admission, he was hemodynamically stable. His oxygen saturation was 86% on room air which has improved to 98-100% on [place] oxygen support. His blood work revealed WBC count 1.2, hemoglobin 8.1, platelets 13, sodium 133, normal creatinine. CXR demonstrated borderline cardiomegaly with pulmonary vascular congestion. He has received lasix 20 mg IV x 2 in ED. GI has seen him in consultation and plans on observation for now. His platelet count is too low for any diagnostic procedures at this point so observation until CBC stable is appropriate.  Barrier to discharge: We will continue to monitor CBC, specifically platelet count which is trending down, 26 this morning.    Assessment/Plan:    Principal Problem: Acute blood loss anemia / Anemia of chronic disease / Upper GI bleed / thrombocytopenia - Likely from alcohol related liver injury, bone marrow suppresion - Pt received 1 unit PRBC during this hospital stay - His Hgb remains stable, 8.8 this morning. - His platelets were 13 on admission but have improved to 34 after 1 unit of platelets. Now platelets again trending down to 26. - He is on protonix drip and protonix 40 mg IV Q 12 hours - As mentioned, GI has seen him in consultation but no endoscopic procedures planned due to ongoing thrombocytopenia - Continue to monitor CBC daily   Active  Problems: Liver cirrhosis / Anasarca - Continue lasix 20 mg IV Q 12 hours - Weight in past 48 hours: 85.9 kg --> 84.5 kg; will check what his weight is today.  - Continue spironolactone - Continue Ceftriaxone for SBP prophylasix.   Neutropenia / sepsis, unspecified organism - Sepsis criteria met with tachycardia, tachypnea, neutropenia. UA unremarkable. Blood and urine culture so far show no growth - Lactic acid and procalcitonin level WNL - He is on ceftriaxone for SBP prophylaxis   Acute Pulmonary edema in setting of cirrhosis / Acute respiratory failure with hypoxia - Continue IV lasix - Continue oxygen support via [place] to keep O2 sats above 90%  Acute alcohol intoxication  - Monitor for withdrawal.  - Continue CIWA protocol - Ethanol 256 on admission   Hyponatremia - Secondary to liver cirrhosis - Sodium normalized    Pancytopenia - Secondary to bone marrow suppression from alcohol abuse - Continue to monitor daily CBC   DVT Prophylaxis  - SCD's bilaterally due to thrombocytopenia    Code Status: Full.  Family Communication:  plan of care discussed with the patient Disposition Plan: home once his platelets stabilize     IV access:  Peripheral IV  Procedures and diagnostic studies:    Dg Chest 2 View 10/10/2014   1. Borderline cardiomegaly with pulmonary vascular congestion and new bilateral effusions is concerning for early congestive heart failure. 2. Mild bibasilar airspace disease likely reflects atelectasis associated with the effusions.    Medical Consultants:  Gastroenterology  Other Consultants:  Nutrition  IAnti-Infectives:   Rocephin 10/10/2014 -->  Leisa Lenz, MD  [facility] Pager [phone]  Time spent in minutes: 25 minutes  If 7PM-7AM, please contact night-coverage [email or web address] Password [facility] 10/13/2014, 7:46 AM   LOS: 3 days    HPI/Subjective: No acute overnight events. Patient reports feeling weak.   Objective: Filed  Vitals:   10/12/14 1200 10/12/14 1415 10/12/14 2054 10/13/14 0411  BP: 143/78 142/79 133/77 155/94  Pulse:  90 107 107  Temp:  98.5 F (36.9 C) 99.8 F (37.7 C)   TempSrc:  Oral Oral Oral  Resp: _0 Height:      Weight:      SpO2: 98% 96% 90% 92%    Intake/Output Summary (Last 24 hours) at 10/13/14 0746 Last data filed at 10/13/14 0500  Gross per 24 hour  Intake   3250 ml  Output   3300 ml  Net    -50 ml    Exam:   General:  Pt is alert, not in acute distress  Cardiovascular: Rate controlled, S1, S2 appreciated   Respiratory: no wheezing, no rhonchi   Abdomen: appreciate bowel sounds, non tender abdomen   Extremities: +1 LE edema, pulses bilateral   Neuro: Nonfocal   Data Reviewed: Basic Metabolic Panel:  Recent Labs Lab 10/10/14 1528 10/11/14 0505  NA 133* 137  K 4.1 3.8  CL 99* 101  CO2 27 30  GLUCOSE 114* 82  BUN 7 7  CREATININE 0.57* 0.58*  CALCIUM 8.4* 8.3*   Liver Function Tests:  Recent Labs Lab 10/10/14 1528 10/11/14 0505  AST 184* 159*  ALT 42 38  ALKPHOS 145* 123  BILITOT 1.5* 1.6*  PROT 7.8 7.3  ALBUMIN 2.5* 2.4*   No results for input(s): LIPASE, AMYLASE in the last 168 hours. No results for input(s): AMMONIA in the last 168 hours. CBC:  Recent Labs Lab 10/10/14 1528  10/11/14 2045 10/12/14 0416 10/12/14 1236 10/12/14 2050 10/13/14 0503  WBC 1.2*  < > 1.1* 1.1* 1.6* 1.6* 2.1*  NEUTROABS 0.5*  --   --   --   --   --   --   HGB 8.1*  < > 8.3* 8.3* 8.8* 8.9* 8.8*  HCT 23.7*  < > 24.9* 24.4* 26.0* 26.3* 25.8*  MCV 103.5*  < > 103.8* 104.7* 105.7* 104.4* 104.0*  PLT 13*  < > 28* 28* 32* 32* 26*  < > = values in this interval not displayed. Cardiac Enzymes: No results for input(s): CKTOTAL, CKMB, CKMBINDEX, TROPONINI in the last 168 hours. BNP: Invalid input(s): POCBNP CBG: No results for input(s): GLUCAP in the last 168 hours.  Results for orders placed or performed during the hospital encounter of 10/10/14   MRSA PCR Screening     Status: None   Collection Time: 10/10/14 11:12 PM  Result Value Ref Range Status   MRSA by PCR NEGATIVE NEGATIVE Final    Comment:        The GeneXpert MRSA Assay (FDA approved for NASAL specimens only), is one component of a comprehensive MRSA colonization surveillance program. It is not intended to diagnose MRSA infection nor to guide or monitor treatment for MRSA infections.   Culture, blood (x 2)     Status: None (Preliminary result)   Collection Time: 10/11/14  5:50 PM  Result Value Ref Range Status   Specimen Description BLOOD RIGHT HAND  Final   Special Requests BOTTLES DRAWN AEROBIC AND ANAEROBIC 10CC  Final   Culture   Final    NO GROWTH <  24 HOURS Performed at [facility]    Report Status PENDING  Incomplete  Culture, blood (x 2)     Status: None (Preliminary result)   Collection Time: 10/11/14  6:05 PM  Result Value Ref Range Status   Specimen Description BLOOD RIGHT ARM  Final   Special Requests IN PEDIATRIC BOTTLE 4CC  Final   Culture   Final    NO GROWTH < 24 HOURS Performed at [facility]    Report Status PENDING  Incomplete  Culture, Urine     Status: None (Preliminary result)   Collection Time: 10/11/14  6:18 PM  Result Value Ref Range Status   Specimen Description URINE, CLEAN CATCH  Final   Special Requests NONE  Final   Culture   Final    NO GROWTH < 12 HOURS Performed at [facility]    Report Status PENDING  Incomplete     Scheduled Meds: . cefTRIAXone   2 g Intravenous Q24H  . folic acid  1 mg Oral Daily  . multivitamin   1 tablet Oral Daily  . (PROTONIX) IV  40 mg Intravenous Q12H  . spironolactone  25 mg Oral Daily  . thiamine  100 mg Oral Daily   Continuous Infusions: . pantoprozole (PROTONIX) infusion 8 mg/hr (10/13/14 0044)

## 2014-10-13 NOTE — Progress Notes (Signed)
[facility] Progress Note  Subjective: Patient without any stools the last 2 or 3 days. Poor appetite and general malaise no other complaints  Objective: Vital signs in last 24 hours: Temp:  [98.5 F (36.9 C)-99.8 F (37.7 C)] 99.8 F (37.7 C) (07/30 2054) Pulse Rate:  [90-107] 107 (07/31 0411) Resp:  [12-18] 18 (07/31 0411) BP: (133-155)/(77-94) 155/94 mmHg (07/31 0411) SpO2:  [90 %-98 %] 92 % (07/31 0411) Weight change:    PE: Semi-alert oriented looks chronically ill some facial edema and no acute distress. Abdomen soft nondistended with normoactive bowel sounds. No hepatomegaly mass or guarding.  Lab Results: Results for orders placed or performed during the hospital encounter of 10/10/14 (from the past 24 hour(s))  CBC     Status: Abnormal   Collection Time: 10/12/14 12:36 PM  Result Value Ref Range   WBC 1.6 (L) 4.0 - 10.5 K/uL   RBC 2.46 (L) 4.22 - 5.81 MIL/uL   Hemoglobin 8.8 (L) 13.0 - 17.0 g/dL   HCT 26.0 (L) 39.0 - 52.0 %   MCV 105.7 (H) 78.0 - 100.0 fL   MCH 35.8 (H) 26.0 - 34.0 pg   MCHC 33.8 30.0 - 36.0 g/dL   RDW 14.3 11.5 - 15.5 %   Platelets 32 (L) 150 - 400 K/uL  CBC     Status: Abnormal   Collection Time: 10/12/14  8:50 PM  Result Value Ref Range   WBC 1.6 (L) 4.0 - 10.5 K/uL   RBC 2.52 (L) 4.22 - 5.81 MIL/uL   Hemoglobin 8.9 (L) 13.0 - 17.0 g/dL   HCT 26.3 (L) 39.0 - 52.0 %   MCV 104.4 (H) 78.0 - 100.0 fL   MCH 35.3 (H) 26.0 - 34.0 pg   MCHC 33.8 30.0 - 36.0 g/dL   RDW 14.1 11.5 - 15.5 %   Platelets 32 (L) 150 - 400 K/uL  CBC     Status: Abnormal   Collection Time: 10/13/14  5:03 AM  Result Value Ref Range   WBC 2.1 (L) 4.0 - 10.5 K/uL   RBC 2.48 (L) 4.22 - 5.81 MIL/uL   Hemoglobin 8.8 (L) 13.0 - 17.0 g/dL   HCT 25.8 (L) 39.0 - 52.0 %   MCV 104.0 (H) 78.0 - 100.0 fL   MCH 35.5 (H) 26.0 - 34.0 pg   MCHC 34.1 30.0 - 36.0 g/dL   RDW 14.0 11.5 - 15.5 %   Platelets 26 (LL) 150 - 400 K/uL    Studies/Results: No results  found.    Assessment: GI bleeding unclear whether upper or lower, strong history of alcohol abuse no EGD in the last 7 years. Leukopenia and thrombocytopenia  Plan: I think his white count has risen enough to where we can perform at least diagnostic EGD with no biopsies to screen for varices and to rule out peptic ulcer disease. Thrombocytopenia is still marked. Will set EGD up for tomorrow morning.    Miachel Nardelli C 10/13/2014, 10:20 AM  Pager [phone] If no answer or after 5 PM call [phone]

## 2014-10-14 ENCOUNTER — Encounter (HOSPITAL_COMMUNITY)
Admission: EM | Disposition: A | Discharge status: Home / Self Care | Payer: Self-pay | Source: Home / Self Care | Attending: Internal Medicine

## 2014-10-14 DIAGNOSIS — K7031 Alcoholic cirrhosis of liver with ascites: Secondary | ICD-10-CM

## 2014-10-14 DIAGNOSIS — D61818 Other pancytopenia: Secondary | ICD-10-CM

## 2014-10-14 DIAGNOSIS — J810 Acute pulmonary edema: Secondary | ICD-10-CM

## 2014-10-14 DIAGNOSIS — D62 Acute posthemorrhagic anemia: Secondary | ICD-10-CM

## 2014-10-14 LAB — CBC WITH DIFFERENTIAL/PLATELET
BASOS ABS: 0 10*3/uL (ref 0.0–0.1)
Basophils Relative: 0 % (ref 0–1)
EOS PCT: 1 % (ref 0–5)
Eosinophils Absolute: 0 10*3/uL (ref 0.0–0.7)
HCT: 25.5 % — ABNORMAL LOW (ref 39.0–52.0)
Hemoglobin: 8.8 g/dL — ABNORMAL LOW (ref 13.0–17.0)
Lymphocytes Relative: 14 % (ref 12–46)
Lymphs Abs: 0.4 10*3/uL — ABNORMAL LOW (ref 0.7–4.0)
MCH: 35.5 pg — ABNORMAL HIGH (ref 26.0–34.0)
MCHC: 34.5 g/dL (ref 30.0–36.0)
MCV: 102.8 fL — ABNORMAL HIGH (ref 78.0–100.0)
Monocytes Absolute: 0.3 10*3/uL (ref 0.1–1.0)
Monocytes Relative: 10 % (ref 3–12)
NEUTROS ABS: 2.1 10*3/uL (ref 1.7–7.7)
Neutrophils Relative %: 76 % (ref 43–77)
PLATELETS: 20 10*3/uL — AB (ref 150–400)
RBC: 2.48 MIL/uL — ABNORMAL LOW (ref 4.22–5.81)
RDW: 13.8 % (ref 11.5–15.5)
WBC: 2.8 10*3/uL — ABNORMAL LOW (ref 4.0–10.5)

## 2014-10-14 LAB — PREPARE PLATELET PHERESIS
UNIT DIVISION: 0
UNIT DIVISION: 0

## 2014-10-14 LAB — CBC
HCT: 24.9 % — ABNORMAL LOW (ref 39.0–52.0)
HEMOGLOBIN: 8.3 g/dL — AB (ref 13.0–17.0)
MCH: 34.6 pg — ABNORMAL HIGH (ref 26.0–34.0)
MCHC: 33.3 g/dL (ref 30.0–36.0)
MCV: 103.8 fL — ABNORMAL HIGH (ref 78.0–100.0)
Platelets: 28 10*3/uL — CL (ref 150–400)
RBC: 2.4 MIL/uL — AB (ref 4.22–5.81)
RDW: 14.5 % (ref 11.5–15.5)
WBC: 1.1 10*3/uL — CL (ref 4.0–10.5)

## 2014-10-14 LAB — BASIC METABOLIC PANEL
Anion gap: 6 (ref 5–15)
BUN: 11 mg/dL (ref 6–20)
CHLORIDE: 94 mmol/L — AB (ref 101–111)
CO2: 31 mmol/L (ref 22–32)
Calcium: 7.5 mg/dL — ABNORMAL LOW (ref 8.9–10.3)
Creatinine, Ser: 0.69 mg/dL (ref 0.61–1.24)
GFR calc Af Amer: >60 mL/min (ref >60)
Glucose, Bld: 91 mg/dL (ref 65–99)
POTASSIUM: 3 mmol/L — AB (ref 3.5–5.1)
Sodium: 131 mmol/L — ABNORMAL LOW (ref 135–145)

## 2014-10-14 SURGERY — EGD (ESOPHAGOGASTRODUODENOSCOPY)
Anesthesia: Moderate Sedation

## 2014-10-14 MED ORDER — FUROSEMIDE 10 MG/ML IJ SOLN
40.0000 mg | Freq: Two times a day (BID) | INTRAMUSCULAR | Status: DC
  Administered 2014-10-14 – 2014-10-16 (×4): 40 mg via INTRAVENOUS
  Filled 2014-10-14 (×4): qty 4

## 2014-10-14 MED ORDER — POTASSIUM CHLORIDE CRYS ER 20 MEQ PO TBCR
40.0000 meq | EXTENDED_RELEASE_TABLET | Freq: Two times a day (BID) | ORAL | Status: AC
  Administered 2014-10-14 – 2014-10-15 (×2): 40 meq via ORAL
  Filled 2014-10-14 (×2): qty 2

## 2014-10-14 MED ORDER — LORAZEPAM 2 MG/ML IJ SOLN
1.0000 mg | Freq: Once | INTRAMUSCULAR | Status: AC
  Administered 2014-10-14: 1 mg via INTRAVENOUS
  Filled 2014-10-14: qty 1

## 2014-10-14 MED ORDER — SPIRONOLACTONE 25 MG PO TABS
50.0000 mg | ORAL_TABLET | Freq: Every day | ORAL | Status: DC
  Administered 2014-10-14 – 2014-10-16 (×3): 50 mg via ORAL
  Filled 2014-10-14 (×3): qty 2

## 2014-10-14 NOTE — Progress Notes (Signed)
No evidence of further GI bleeding; stools reported as being non-melenic in character per RN note.  Hemoglobin stable.  Patient without active complaints, apart from his chronic back pain.  Abdomen remained somewhat distended but with flank tympany suggesting the absence of ascites.  Platelets remains severely depressed, with the current count of 20,000.  On the other hand, his leukopenia is improved, with a current absolute neutrophil count of 2100.  The patient is somewhat somnolent and groggy, presumably as the consequence of his alcohol withdrawal sedation protocol. He does not appear tremulous.  Impression:  1. GI bleeding, resolved, indeterminate origin 2. Alcoholic liver disease   Recommendations:  1. As previously recommended, I would favor hematologic consultation. Perhaps that has occurred, but I cannot find the consult note on the record if that is the case. 2. I have canceled the endoscopy which was scheduled for today, in view of the fact that the patient is not showing signs of bleeding, and his severe thrombocytopenia would make a nonessential exam inappropriately risky, in my opinion. Although it would help provide information concerning the status of his stomach, such as non-scrotal gastropathy or portal gastropathy and/or the presence of varices, I do not feel that that information would impact his treatment significantly at this point. 3. I had an extensive discussion with the patient and his wife regarding the necessity of alcohol abstinence for optimal medical outcome, and that continued alcohol consumption will override any medical treatments that we can try to provide. The patient's wife indicates that he had an interesting going to rehabilitation, although she is not sure that interest will persist once he gets feeling better. She herself has been helping him obtain his alcohol, because "if I don't to it, someone else well, and it will cost [place] more."  Importance of not  enabling his alcohol consumption expressed.  Cleotis Nipper, M.D. Pager [phone] If no answer or after 5 PM call [phone]

## 2014-10-14 NOTE — Progress Notes (Signed)
[facility] PROGRESS NOTE I Assessment/Plan: Melena due to upper GI bleeding in the setting of thrombocytopenia and cirrhosis/acute blood loss anemia/AOCD: - He received 2 units of platelets on admission and 1 unit of packed red blood cells. He had been taking NSAIDs at home - GI was consulted to like not to proceed with EGD until thrombocytopenia is improved, EGD likely on 8.1.2016 - They recommended to treat conservatively. - We'll change Protonix once a day. - Continue monitor CBCs.  Acute pulmonary edema with acute respiratory failure with hypoxia/Liver cirrhosis/anasarca/ Alcoholic cirrhosis of liver with ascites: - Cont on IV Lasix,  increases Aldactone. - On Rocephin for prophylaxis. Has remained afebrile.  Neutropenia: - Likely due to EtOH abuse, slowly improving.  Thrombocytopenia - Platelets are fluctuating from 25-40. On admission 13. - We'll continue to monitor. No signs of bleeding.  Acute alcohol intoxication   - On admission EtOH level CCLVI, currently being monitored with serial protocol.  Hyponatremia: Likely due to liver cirrhosis, on spironolactone.  Pancytopenia: Nightly due to alcohol abuse.    Code Status: full Family Communication: wife  Disposition Plan: inpatient   Consultants:  GI  Procedures:  none  Antibiotics:  Rocephin 7.28.2016  HPI/Subjective: No complains wants to go home.  Objective: Filed Vitals:   10/13/14 1543 10/13/14 2050 10/14/14 0443 10/14/14 0654  BP: 139/74 148/84 140/77   Pulse: 107 108 109 84  Temp: 99 F (37.2 C) 99.9 F (37.7 C) 98.7 F (37.1 C)   TempSrc: Oral Oral Oral   Resp: 20 20 18    Height:      Weight:   80.5 kg (177 lb 7.5 oz)   SpO2: 93% 97% 100%     Intake/Output Summary (Last 24 hours) at 10/14/14 0955 Last data filed at 10/14/14 0855  Gross per 24 hour  Intake   2210 ml  Output   1701 ml  Net    509 ml   Filed Weights   10/12/14 0400 10/13/14 0502 10/14/14 0443  Weight:  84.5 kg (186 lb 4.6 oz) 82.3 kg (181 lb 7 oz) 80.5 kg (177 lb 7.5 oz)    Exam:  General: Alert, awake, oriented x3, in no acute distress.  HEENT: No bruits, no goiter.  Heart: Regular rate and rhythm, without murmurs, rubs, gallops.  Lungs: Good air movement, bilateral air movement.  Abdomen: Soft, nontender, nondistended, positive bowel sounds.  Neuro: Grossly intact, nonfocal.   Data Reviewed: Basic Metabolic Panel:  Recent Labs Lab 10/10/14 1528 10/11/14 0505  NA 133* 137  K 4.1 3.8  CL 99* 101  CO2 27 30  GLUCOSE 114* 82  BUN 7 7  CREATININE 0.57* 0.58*  CALCIUM 8.4* 8.3*   Liver Function Tests:  Recent Labs Lab 10/10/14 1528 10/11/14 0505  AST 184* 159*  ALT 42 38  ALKPHOS 145* 123  BILITOT 1.5* 1.6*  PROT 7.8 7.3  ALBUMIN 2.5* 2.4*   No results for input(s): LIPASE, AMYLASE in the last 168 hours. No results for input(s): AMMONIA in the last 168 hours. CBC:  Recent Labs Lab 10/10/14 1528  10/12/14 1236 10/12/14 2050 10/13/14 0503 10/13/14 1153 10/13/14 2037  WBC 1.2*  < > 1.6* 1.6* 2.1* 2.8* 3.8*  NEUTROABS 0.5*  --   --   --   --   --   --   HGB 8.1*  < > 8.8* 8.9* 8.8* 9.0* 9.3*  HCT 23.7*  < > 26.0* 26.3* 25.8* 26.2* 27.0*  MCV 103.5*  < >  105.7* 104.4* 104.0* 101.6* 102.7*  PLT 13*  < > 32* 32* 26* 40* 25*  < > = values in this interval not displayed. Cardiac Enzymes: No results for input(s): CKTOTAL, CKMB, CKMBINDEX, TROPONINI in the last 168 hours. BNP (last 3 results)  Recent Labs  10/10/14 1702  BNP 120.9*    ProBNP (last 3 results) No results for input(s): PROBNP in the last 8760 hours.  CBG: No results for input(s): GLUCAP in the last 168 hours.  Recent Results (from the past 240 hour(s))  MRSA PCR Screening     Status: None   Collection Time: 10/10/14 11:12 PM  Result Value Ref Range Status   MRSA by PCR NEGATIVE NEGATIVE Final    Comment:        The GeneXpert MRSA Assay (FDA approved for NASAL specimens only), is  one component of a comprehensive MRSA colonization surveillance program. It is not intended to diagnose MRSA infection nor to guide or monitor treatment for MRSA infections.   Culture, blood (x 2)     Status: None (Preliminary result)   Collection Time: 10/11/14  5:50 PM  Result Value Ref Range Status   Specimen Description BLOOD RIGHT HAND  Final   Special Requests BOTTLES DRAWN AEROBIC AND ANAEROBIC 10CC  Final   Culture   Final    NO GROWTH 2 DAYS Performed at [facility]    Report Status PENDING  Incomplete  Culture, blood (x 2)     Status: None (Preliminary result)   Collection Time: 10/11/14  6:05 PM  Result Value Ref Range Status   Specimen Description BLOOD RIGHT ARM  Final   Special Requests IN PEDIATRIC BOTTLE 4CC  Final   Culture   Final    NO GROWTH 2 DAYS Performed at [facility]    Report Status PENDING  Incomplete  Culture, Urine     Status: None   Collection Time: 10/11/14  6:18 PM  Result Value Ref Range Status   Specimen Description URINE, CLEAN CATCH  Final   Special Requests NONE  Final   Culture   Final    NO GROWTH 2 DAYS Performed at [facility]    Report Status 10/13/2014 FINAL  Final     Studies: No results found.  Scheduled Meds: . sodium chloride   Intravenous Once  . antiseptic oral rinse  7 mL Mouth Rinse BID  . cefTRIAXone (ROCEPHIN)  IV  2 g Intravenous Q24H  . folic acid  1 mg Oral Daily  . furosemide  20 mg Intravenous Q12H  . multivitamin with minerals  1 tablet Oral Daily  . pantoprazole (PROTONIX) IV  40 mg Intravenous Q12H  . sodium chloride  3 mL Intravenous Q12H  . spironolactone  25 mg Oral Daily  . thiamine  100 mg Oral Daily   Or  . thiamine  100 mg Intravenous Daily   Continuous Infusions:   Time Spent: 25 min   Charlynne Cousins  [facility] Pager [phone]. If 7PM-7AM, please contact night-coverage at [email or web address], password [facility] 10/14/2014, 9:55 AM  LOS: 4 days

## 2014-10-14 NOTE — Progress Notes (Signed)
CRITICAL VALUE ALERT  Critical value received:  Platelet 28 on (7/29 at 2045)  Date of notification:  10/14/14  Time of notification:  0955  Critical value read back:Yes.    Nurse who received alert:  HDavis, RN  MD notified (1st page):  Feliz-Ortiz  Time of first page:    MD notified (2nd page):  Time of second page:  Responding MD:    Time MD responded:

## 2014-10-14 NOTE — Progress Notes (Signed)
CIWA has decreased to between 3-4 this shift and did NOT have any ativan..  Able to get pt oob to bsc with one person assist.  One soft brown/yellow stool this shift.  Temp high this was 99.9 and it has come down this am to 98.7.  PLTs drawn last night were 25 and had been 26 Sunday morning. No active bleeding noted.

## 2014-10-15 ENCOUNTER — Inpatient Hospital Stay (HOSPITAL_COMMUNITY): Payer: 59

## 2014-10-15 LAB — BASIC METABOLIC PANEL
Anion gap: 6 (ref 5–15)
Anion gap: 6 (ref 5–15)
BUN: 16 mg/dL (ref 6–20)
BUN: 16 mg/dL (ref 6–20)
CALCIUM: 7.7 mg/dL — AB (ref 8.9–10.3)
CO2: 31 mmol/L (ref 22–32)
CO2: 32 mmol/L (ref 22–32)
CREATININE: 0.96 mg/dL (ref 0.61–1.24)
Calcium: 8 mg/dL — ABNORMAL LOW (ref 8.9–10.3)
Chloride: 95 mmol/L — ABNORMAL LOW (ref 101–111)
Chloride: 93 mmol/L — ABNORMAL LOW (ref 101–111)
Creatinine, Ser: 0.76 mg/dL (ref 0.61–1.24)
GFR calc Af Amer: >60 mL/min (ref >60)
GFR calc Af Amer: >60 mL/min (ref >60)
GFR calc non Af Amer: >60 mL/min (ref >60)
GFR calc non Af Amer: >60 mL/min (ref >60)
Glucose, Bld: 97 mg/dL (ref 65–99)
Glucose, Bld: 157 mg/dL — ABNORMAL HIGH (ref 65–99)
Potassium: 3.2 mmol/L — ABNORMAL LOW (ref 3.5–5.1)
Potassium: 3.5 mmol/L (ref 3.5–5.1)
Sodium: 132 mmol/L — ABNORMAL LOW (ref 135–145)
Sodium: 131 mmol/L — ABNORMAL LOW (ref 135–145)

## 2014-10-15 LAB — CBC WITH DIFFERENTIAL/PLATELET
Basophils Absolute: 0 10*3/uL (ref 0.0–0.1)
Basophils Relative: 0 % (ref 0–1)
Eosinophils Absolute: 0 10*3/uL (ref 0.0–0.7)
Eosinophils Relative: 1 % (ref 0–5)
HCT: 25.7 % — ABNORMAL LOW (ref 39.0–52.0)
HEMOGLOBIN: 8.7 g/dL — AB (ref 13.0–17.0)
LYMPHS ABS: 0.3 10*3/uL — AB (ref 0.7–4.0)
Lymphocytes Relative: 16 % (ref 12–46)
MCH: 35.1 pg — AB (ref 26.0–34.0)
MCHC: 33.9 g/dL (ref 30.0–36.0)
MCV: 103.6 fL — ABNORMAL HIGH (ref 78.0–100.0)
MONOS PCT: 16 % — AB (ref 3–12)
Monocytes Absolute: 0.3 10*3/uL (ref 0.1–1.0)
NEUTROS ABS: 1.4 10*3/uL — AB (ref 1.7–7.7)
Neutrophils Relative %: 67 % (ref 43–77)
Platelets: 20 10*3/uL — CL (ref 150–400)
RBC: 2.48 MIL/uL — ABNORMAL LOW (ref 4.22–5.81)
RDW: 13.7 % (ref 11.5–15.5)
WBC: 2.1 10*3/uL — ABNORMAL LOW (ref 4.0–10.5)

## 2014-10-15 MED ORDER — TRAZODONE HCL 50 MG PO TABS
50.0000 mg | ORAL_TABLET | Freq: Every evening | ORAL | Status: DC | PRN
  Administered 2014-10-15: 50 mg via ORAL
  Filled 2014-10-15: qty 1

## 2014-10-15 NOTE — Progress Notes (Signed)
Please see yesterday's note for recommendations.  Given this patient's apparent fluid retention, and the fact that it has been several months since his last abdominal imaging procedure, I have put him on for an abd u/s tomorrow.  In addition, the reason prompting admission (GI bleeding) has resolved, but otherwise, one senses that this patient is sort of languishing, that we are "going nowhere fast" with this patient, raising the question whether some sort of placement is appropriate.    The patient is medically too weak and fragile to go directly to an alcohol rehab facility, so I would start to look into the possibility of a medical rehab admission.  Presently, he is quite alert, much moreso than yesterday, and does serial 3's with minimal errors.  No asterixis.  [name], M.D. Pager [phone] If no answer or after 5 PM call [phone]

## 2014-10-15 NOTE — Progress Notes (Addendum)
[facility] PROGRESS NOTE I Assessment/Plan: Melena due to upper GI bleeding in the setting of thrombocytopenia and cirrhosis/acute blood loss anemia/AOCD: - He received 2 units of platelets on admission and 1 unit of packed red blood cells. He had been taking NSAIDs at home - GI was consulted no EGD until thrombocytopenia is improved. - We'll change Protonix once a day. - consult hematology for unexplained thrombocytopenia.  Acute pulmonary edema with acute respiratory failure with hypoxia/Liver cirrhosis/anasarca/ Alcoholic cirrhosis of liver with ascites: - Cont on IV Lasix,  increases Aldactone. - On Rocephin for prophylaxis. b-met pending.  Neutropenia: - Likely due to EtOH abuse, slowly improving.  Thrombocytopenia - Platelets are fluctuating from 25-40. - Consult hematology.  Acute alcohol intoxication   - On admission EtOH level, cont CIWA protocol.  Hyponatremia: Likely due to liver cirrhosis, on spironolactone.   Code Status: full Family Communication: wife  Disposition Plan: inpatient   Consultants:  GI  Procedures:  none  Antibiotics:  Rocephin 7.28.2016  HPI/Subjective: No complains.  Objective: Filed Vitals:   10/14/14 1355 10/14/14 2013 10/15/14 0559 10/15/14 0900  BP: 123/70 124/64 110/64   Pulse: 97 109 100   Temp: 98.8 F (37.1 C) 99.8 F (37.7 C) 99.5 F (37.5 C)   TempSrc: Oral Oral Oral   Resp: _0 Height:      Weight:   80.4 kg (177 lb 4 oz)   SpO2: 98% 97% 98% 91%    Intake/Output Summary (Last 24 hours) at 10/15/14 1210 Last data filed at 10/15/14 1041  Gross per 24 hour  Intake   1210 ml  Output   2100 ml  Net   -890 ml   Filed Weights   10/13/14 0502 10/14/14 0443 10/15/14 0559  Weight: 82.3 kg (181 lb 7 oz) 80.5 kg (177 lb 7.5 oz) 80.4 kg (177 lb 4 oz)    Exam:  General: Alert, awake, oriented x3, in no acute distress.  HEENT: No bruits, no goiter.  Heart: Regular rate and rhythm, without  murmurs, rubs, gallops.  Lungs: Good air movement, bilateral air movement.  Abdomen: Soft, nontender, nondistended, positive bowel sounds.  Neuro: Grossly intact, nonfocal.   Data Reviewed: Basic Metabolic Panel:  Recent Labs Lab 10/10/14 1528 10/11/14 0505 10/14/14 1015 10/15/14 0429  NA 133* 137 131* 132*  K 4.1 3.8 3.0* 3.2*  CL 99* 101 94* 95*  CO2 _1 GLUCOSE 114* 82 91 97  BUN _2 CREATININE 0.57* 0.58* 0.69 0.76  CALCIUM 8.4* 8.3* 7.5* 7.7*   Liver Function Tests:  Recent Labs Lab 10/10/14 1528 10/11/14 0505  AST 184* 159*  ALT 42 38  ALKPHOS 145* 123  BILITOT 1.5* 1.6*  PROT 7.8 7.3  ALBUMIN 2.5* 2.4*   No results for input(s): LIPASE, AMYLASE in the last 168 hours. No results for input(s): AMMONIA in the last 168 hours. CBC:  Recent Labs Lab 10/10/14 1528  10/13/14 0503 10/13/14 1153 10/13/14 2037 10/14/14 1015 10/15/14 0429  WBC 1.2*  < > 2.1* 2.8* 3.8* 2.8* 2.1*  NEUTROABS 0.5*  --   --   --   --  2.1 1.4*  HGB 8.1*  < > 8.8* 9.0* 9.3* 8.8* 8.7*  HCT 23.7*  < > 25.8* 26.2* 27.0* 25.5* 25.7*  MCV 103.5*  < > 104.0* 101.6* 102.7* 102.8* 103.6*  PLT 13*  < > 26* 40* 25* 20* 20*  < > = values in this  interval not displayed. Cardiac Enzymes: No results for input(s): CKTOTAL, CKMB, CKMBINDEX, TROPONINI in the last 168 hours. BNP (last 3 results)  Recent Labs  10/10/14 1702  BNP 120.9*    ProBNP (last 3 results) No results for input(s): PROBNP in the last 8760 hours.  CBG: No results for input(s): GLUCAP in the last 168 hours.  Recent Results (from the past 240 hour(s))  MRSA PCR Screening     Status: None   Collection Time: 10/10/14 11:12 PM  Result Value Ref Range Status   MRSA by PCR NEGATIVE NEGATIVE Final    Comment:        The GeneXpert MRSA Assay (FDA approved for NASAL specimens only), is one component of a comprehensive MRSA colonization surveillance program. It is not intended to diagnose MRSA infection  nor to guide or monitor treatment for MRSA infections.   Culture, blood (x 2)     Status: None (Preliminary result)   Collection Time: 10/11/14  5:50 PM  Result Value Ref Range Status   Specimen Description BLOOD RIGHT HAND  Final   Special Requests BOTTLES DRAWN AEROBIC AND ANAEROBIC 10CC  Final   Culture   Final    NO GROWTH 3 DAYS Performed at [facility]    Report Status PENDING  Incomplete  Culture, blood (x 2)     Status: None (Preliminary result)   Collection Time: 10/11/14  6:05 PM  Result Value Ref Range Status   Specimen Description BLOOD RIGHT ARM  Final   Special Requests IN PEDIATRIC BOTTLE 4CC  Final   Culture   Final    NO GROWTH 3 DAYS Performed at [facility]    Report Status PENDING  Incomplete  Culture, Urine     Status: None   Collection Time: 10/11/14  6:18 PM  Result Value Ref Range Status   Specimen Description URINE, CLEAN CATCH  Final   Special Requests NONE  Final   Culture   Final    NO GROWTH 2 DAYS Performed at [facility]    Report Status 10/13/2014 FINAL  Final     Studies: US Abdomen Complete  10/15/2014   CLINICAL DATA:  Liver disease  EXAM: ULTRASOUND ABDOMEN COMPLETE  COMPARISON:  07/31/2014  FINDINGS: Gallbladder: The gallbladder wall remains thickened with a maximal diameter of 4 mm. There continues to be edema adjacent to the gallbladder within the liver. No gallstones are present. Negative Murphy sign.  Common bile duct: Diameter: 6 mm in caliber.  Liver: There is minimal nodularity of the contour but no focal liver mass. Homogeneous echotexture throughout the liver.  IVC: No abnormality visualized.  Pancreas: The pancreas was obscured by overlying bowel gas.  Spleen: The spleen is enlarged with a maximal diameter of 15 cm. Volume is estimated at 825 cc. No focal mass.  Right Kidney: Length: 14.2 cm. Echogenicity within normal limits. No mass or hydronephrosis visualized.  Left Kidney: Length: 12.4 cm. Echogenicity  within normal limits. No mass or hydronephrosis visualized.  Abdominal aorta: Portions of the aorta were obscured. Maximal visualized diameter was 3.0 cm.  Other findings: Small amount of free fluid about the liver.  IMPRESSION: Stable gallbladder wall thickening with adjacent edema but no stones. No Murphy sign.  Small amount of free fluid.  Pancreas and portions of the aorta were obscured.  Maximal aortic caliber was 3.0 cm. Recommend followup by ultrasound in 3 years. This recommendation follows ACR consensus guidelines: White Paper of the ACR Incidental Findings  Committee II on Vascular Findings. J Am Coll Radiol 2013; 10:789-794.   Electronically Signed   By: Marybelle Killings M.D.   On: 10/15/2014 10:32    Scheduled Meds: . sodium chloride   Intravenous Once  . antiseptic oral rinse  7 mL Mouth Rinse BID  . cefTRIAXone (ROCEPHIN)  IV  2 g Intravenous Q24H  . folic acid  1 mg Oral Daily  . furosemide  40 mg Intravenous Q12H  . multivitamin with minerals  1 tablet Oral Daily  . pantoprazole (PROTONIX) IV  40 mg Intravenous Q12H  . sodium chloride  3 mL Intravenous Q12H  . spironolactone  50 mg Oral Daily  . thiamine  100 mg Oral Daily   Or  . thiamine  100 mg Intravenous Daily   Continuous Infusions:   Time Spent: 25 min   Charlynne Cousins  [facility] Pager [phone]. If 7PM-7AM, please contact night-coverage at [email or web address], password [facility] 10/15/2014, 12:10 PM  LOS: 5 days

## 2014-10-16 ENCOUNTER — Other Ambulatory Visit: Payer: Self-pay | Admitting: Hematology and Oncology

## 2014-10-16 DIAGNOSIS — F1010 Alcohol abuse, uncomplicated: Secondary | ICD-10-CM

## 2014-10-16 DIAGNOSIS — K769 Liver disease, unspecified: Secondary | ICD-10-CM

## 2014-10-16 DIAGNOSIS — D709 Neutropenia, unspecified: Secondary | ICD-10-CM

## 2014-10-16 DIAGNOSIS — D638 Anemia in other chronic diseases classified elsewhere: Secondary | ICD-10-CM

## 2014-10-16 DIAGNOSIS — R161 Splenomegaly, not elsewhere classified: Secondary | ICD-10-CM

## 2014-10-16 DIAGNOSIS — D500 Iron deficiency anemia secondary to blood loss (chronic): Secondary | ICD-10-CM

## 2014-10-16 DIAGNOSIS — D696 Thrombocytopenia, unspecified: Secondary | ICD-10-CM

## 2014-10-16 DIAGNOSIS — K922 Gastrointestinal hemorrhage, unspecified: Principal | ICD-10-CM

## 2014-10-16 LAB — CBC WITH DIFFERENTIAL/PLATELET
BASOS PCT: 1 % (ref 0–1)
Basophils Absolute: 0 10*3/uL (ref 0.0–0.1)
Eosinophils Absolute: 0 10*3/uL (ref 0.0–0.7)
Eosinophils Relative: 2 % (ref 0–5)
HCT: 25.7 % — ABNORMAL LOW (ref 39.0–52.0)
HEMOGLOBIN: 8.7 g/dL — AB (ref 13.0–17.0)
LYMPHS PCT: 23 % (ref 12–46)
Lymphs Abs: 0.3 10*3/uL — ABNORMAL LOW (ref 0.7–4.0)
MCH: 35.1 pg — AB (ref 26.0–34.0)
MCHC: 33.9 g/dL (ref 30.0–36.0)
MCV: 103.6 fL — ABNORMAL HIGH (ref 78.0–100.0)
MONO ABS: 0.3 10*3/uL (ref 0.1–1.0)
MONOS PCT: 20 % — AB (ref 3–12)
Neutro Abs: 0.8 10*3/uL — ABNORMAL LOW (ref 1.7–7.7)
Neutrophils Relative %: 54 % (ref 43–77)
Platelets: 18 10*3/uL — CL (ref 150–400)
RBC: 2.48 MIL/uL — AB (ref 4.22–5.81)
RDW: 13.6 % (ref 11.5–15.5)
WBC: 1.4 10*3/uL — AB (ref 4.0–10.5)

## 2014-10-16 LAB — BASIC METABOLIC PANEL
Anion gap: 8 (ref 5–15)
BUN: 21 mg/dL — AB (ref 6–20)
CO2: 29 mmol/L (ref 22–32)
Calcium: 7.6 mg/dL — ABNORMAL LOW (ref 8.9–10.3)
Chloride: 96 mmol/L — ABNORMAL LOW (ref 101–111)
Creatinine, Ser: 0.89 mg/dL (ref 0.61–1.24)
Glucose, Bld: 96 mg/dL (ref 65–99)
Potassium: 3.3 mmol/L — ABNORMAL LOW (ref 3.5–5.1)
SODIUM: 133 mmol/L — AB (ref 135–145)

## 2014-10-16 LAB — CULTURE, BLOOD (ROUTINE X 2)
CULTURE: NO GROWTH
CULTURE: NO GROWTH

## 2014-10-16 LAB — MAGNESIUM: Magnesium: 1 mg/dL — ABNORMAL LOW (ref 1.7–2.4)

## 2014-10-16 LAB — SAVE SMEAR

## 2014-10-16 LAB — VITAMIN B12: VITAMIN B 12: 785 pg/mL (ref 180–914)

## 2014-10-16 MED ORDER — FUROSEMIDE 10 MG/ML IJ SOLN
40.0000 mg | Freq: Every day | INTRAMUSCULAR | Status: DC
  Administered 2014-10-17: 40 mg via INTRAVENOUS
  Filled 2014-10-16: qty 4

## 2014-10-16 MED ORDER — POTASSIUM CHLORIDE CRYS ER 20 MEQ PO TBCR: 40.0000 meq | EXTENDED_RELEASE_TABLET | Freq: Two times a day (BID) | ORAL | Status: DC

## 2014-10-16 MED ORDER — SPIRONOLACTONE 25 MG PO TABS
100.0000 mg | ORAL_TABLET | Freq: Every day | ORAL | Status: DC
  Administered 2014-10-17: 100 mg via ORAL
  Filled 2014-10-16: qty 4

## 2014-10-16 MED ORDER — MAGNESIUM OXIDE 400 (241.3 MG) MG PO TABS
400.0000 mg | ORAL_TABLET | Freq: Two times a day (BID) | ORAL | Status: AC
Start: 2014-10-16 — End: 2014-10-16
  Administered 2014-10-16 (×2): 400 mg via ORAL
  Filled 2014-10-16 (×2): qty 1

## 2014-10-16 MED ORDER — FOLIC ACID 1 MG PO TABS: 1.0000 mg | ORAL_TABLET | Freq: Every day | ORAL | Status: DC

## 2014-10-16 MED ORDER — POTASSIUM CHLORIDE CRYS ER 20 MEQ PO TBCR
40.0000 meq | EXTENDED_RELEASE_TABLET | Freq: Once | ORAL | Status: AC
  Administered 2014-10-16: 40 meq via ORAL
  Filled 2014-10-16: qty 2

## 2014-10-16 MED ORDER — CYANOCOBALAMIN 1000 MCG/ML IJ SOLN
1000.0000 ug | Freq: Every day | INTRAMUSCULAR | Status: DC
  Administered 2014-10-16: 1000 ug via INTRAMUSCULAR
  Filled 2014-10-16 (×2): qty 1

## 2014-10-16 NOTE — Progress Notes (Signed)
[facility] PROGRESS NOTE  Assessment/Plan: Melena due to upper GI bleeding in the setting of thrombocytopenia, NSAID's use and cirrhosis/acute blood loss anemia/AOCD: - He received 2 units of platelets on admission and 1 unit of packed red blood cells. He had been taking NSAIDs at home - GI was consulted no EGD until thrombocytopenia is improved. - We'll change Protonix once a day. - Consulted hematology. - thrombocytopenia likely due ETOH abuse, check B12 & RBC folate pending.  Acute pulmonary edema with acute respiratory failure with hypoxia/Liver cirrhosis/anasarca/ Alcoholic cirrhosis of liver with ascites: - On IV lasix and aldactone, with moderate diuresis. - On Rocephin for prophylaxis.   Neutropenia: - Likely due to EtOH abuse. - now worsen due to ETOH abuse. - cont to monitor.  Thrombocytopenia - Platelets are fluctuating trending down. - Consult hematology. - Due ETOH abuse,  Check B12 and RBC folate.  Acute alcohol intoxication   - On admission EtOH level, cont CIWA protocol. - CIWA score improved.  Hyponatremia: Likely due to liver cirrhosis, cont to titrate aldactone.   Code Status: full Family Communication: wife  Disposition Plan: inpatient   Consultants:  GI  Procedures:  none  Antibiotics:  Rocephin 7.28.2016  HPI/Subjective: No complains.  Objective: Filed Vitals:   10/15/14 2134 10/15/14 2138 10/16/14 0427 10/16/14 0428  BP: 111/65  112/67   Pulse: 94  88   Temp: 100 F (37.8 C)  98.6 F (37 C)   TempSrc: Oral  Oral   Resp: 16  16   Height:      Weight:    79.4 kg (175 lb 0.7 oz)  SpO2: 86% 94% 97%     Intake/Output Summary (Last 24 hours) at 10/16/14 1040 Last data filed at 10/16/14 0734  Gross per 24 hour  Intake   1010 ml  Output    800 ml  Net    210 ml   Filed Weights   10/14/14 0443 10/15/14 0559 10/16/14 0428  Weight: 80.5 kg (177 lb 7.5 oz) 80.4 kg (177 lb 4 oz) 79.4 kg (175 lb 0.7 oz)     Exam:  General: Alert, awake, oriented x3, in no acute distress.  HEENT: No bruits, no goiter.  Heart: Regular rate and rhythm, without murmurs, rubs, gallops.  Lungs: Good air movement, bilateral air movement.  Abdomen: Soft, nontender, nondistended, positive bowel sounds.  Neuro: Grossly intact, nonfocal.   Data Reviewed: Basic Metabolic Panel:  Recent Labs Lab 10/11/14 0505 10/14/14 1015 10/15/14 0429 10/15/14 1250 10/16/14 0453  NA 137 131* 132* 131* 133*  K 3.8 3.0* 3.2* 3.5 3.3*  CL 101 94* 95* 93* 96*  CO2 30 31 31  32 29  GLUCOSE 82 91 97 157* 96  BUN 7 11 16 16  21*  CREATININE 0.58* 0.69 0.76 0.96 0.89  CALCIUM 8.3* 7.5* 7.7* 8.0* 7.6*   Liver Function Tests:  Recent Labs Lab 10/10/14 1528 10/11/14 0505  AST 184* 159*  ALT 42 38  ALKPHOS 145* 123  BILITOT 1.5* 1.6*  PROT 7.8 7.3  ALBUMIN 2.5* 2.4*   No results for input(s): LIPASE, AMYLASE in the last 168 hours. No results for input(s): AMMONIA in the last 168 hours. CBC:  Recent Labs Lab 10/10/14 1528  10/13/14 1153 10/13/14 2037 10/14/14 1015 10/15/14 0429 10/16/14 0453  WBC 1.2*  < > 2.8* 3.8* 2.8* 2.1* 1.4*  NEUTROABS 0.5*  --   --   --  2.1 1.4* 0.8*  HGB 8.1*  < > 9.0* 9.3*  8.8* 8.7* 8.7*  HCT 23.7*  < > 26.2* 27.0* 25.5* 25.7* 25.7*  MCV 103.5*  < > 101.6* 102.7* 102.8* 103.6* 103.6*  PLT 13*  < > 40* 25* 20* 20* 18*  < > = values in this interval not displayed. Cardiac Enzymes: No results for input(s): CKTOTAL, CKMB, CKMBINDEX, TROPONINI in the last 168 hours. BNP (last 3 results)  Recent Labs  10/10/14 1702  BNP 120.9*    ProBNP (last 3 results) No results for input(s): PROBNP in the last 8760 hours.  CBG: No results for input(s): GLUCAP in the last 168 hours.  Recent Results (from the past 240 hour(s))  MRSA PCR Screening     Status: None   Collection Time: 10/10/14 11:12 PM  Result Value Ref Range Status   MRSA by PCR NEGATIVE NEGATIVE Final    Comment:         The GeneXpert MRSA Assay (FDA approved for NASAL specimens only), is one component of a comprehensive MRSA colonization surveillance program. It is not intended to diagnose MRSA infection nor to guide or monitor treatment for MRSA infections.   Culture, blood (x 2)     Status: None (Preliminary result)   Collection Time: 10/11/14  5:50 PM  Result Value Ref Range Status   Specimen Description BLOOD RIGHT HAND  Final   Special Requests BOTTLES DRAWN AEROBIC AND ANAEROBIC 10CC  Final   Culture   Final    NO GROWTH 4 DAYS Performed at [facility]    Report Status PENDING  Incomplete  Culture, blood (x 2)     Status: None (Preliminary result)   Collection Time: 10/11/14  6:05 PM  Result Value Ref Range Status   Specimen Description BLOOD RIGHT ARM  Final   Special Requests IN PEDIATRIC BOTTLE 4CC  Final   Culture   Final    NO GROWTH 4 DAYS Performed at [facility]    Report Status PENDING  Incomplete  Culture, Urine     Status: None   Collection Time: 10/11/14  6:18 PM  Result Value Ref Range Status   Specimen Description URINE, CLEAN CATCH  Final   Special Requests NONE  Final   Culture   Final    NO GROWTH 2 DAYS Performed at [facility]    Report Status 10/13/2014 FINAL  Final     Studies: US Abdomen Complete  10/15/2014   CLINICAL DATA:  Liver disease  EXAM: ULTRASOUND ABDOMEN COMPLETE  COMPARISON:  07/31/2014  FINDINGS: Gallbladder: The gallbladder wall remains thickened with a maximal diameter of 4 mm. There continues to be edema adjacent to the gallbladder within the liver. No gallstones are present. Negative Murphy sign.  Common bile duct: Diameter: 6 mm in caliber.  Liver: There is minimal nodularity of the contour but no focal liver mass. Homogeneous echotexture throughout the liver.  IVC: No abnormality visualized.  Pancreas: The pancreas was obscured by overlying bowel gas.  Spleen: The spleen is enlarged with a maximal diameter of 15 cm.  Volume is estimated at 825 cc. No focal mass.  Right Kidney: Length: 14.2 cm. Echogenicity within normal limits. No mass or hydronephrosis visualized.  Left Kidney: Length: 12.4 cm. Echogenicity within normal limits. No mass or hydronephrosis visualized.  Abdominal aorta: Portions of the aorta were obscured. Maximal visualized diameter was 3.0 cm.  Other findings: Small amount of free fluid about the liver.  IMPRESSION: Stable gallbladder wall thickening with adjacent edema but no stones. No Percell Miller  sign.  Small amount of free fluid.  Pancreas and portions of the aorta were obscured.  Maximal aortic caliber was 3.0 cm. Recommend followup by ultrasound in 3 years. This recommendation follows ACR consensus guidelines: White Paper of the ACR Incidental Findings Committee II on Vascular Findings. J Am Coll Radiol 2013; 10:789-794.   Electronically Signed   By: Marybelle Killings M.D.   On: 10/15/2014 10:32    Scheduled Meds: . sodium chloride   Intravenous Once  . antiseptic oral rinse  7 mL Mouth Rinse BID  . cefTRIAXone (ROCEPHIN)  IV  2 g Intravenous Q24H  . folic acid  1 mg Oral Daily  . furosemide  40 mg Intravenous Q12H  . multivitamin with minerals  1 tablet Oral Daily  . pantoprazole (PROTONIX) IV  40 mg Intravenous Q12H  . sodium chloride  3 mL Intravenous Q12H  . spironolactone  50 mg Oral Daily  . thiamine  100 mg Oral Daily   Or  . thiamine  100 mg Intravenous Daily   Continuous Infusions:   Time Spent: 25 min   Charlynne Cousins  [facility] Pager [phone]. If 7PM-7AM, please contact night-coverage at [email or web address], password [facility] 10/16/2014, 10:40 AM  LOS: 6 days

## 2014-10-16 NOTE — Progress Notes (Signed)
Patient feels okay.  His mental status seems better today.  Hemoglobin stable, platelets remain low. Awaiting hematology consultation. I have never seen this degree of thrombocytopenia from bone marrow suppression related to alcohol; his ultrasound does show 15 cm spleen so splenic sequestration could be playing a role but again, I have not seen this degree of severity on that basis.  Notably, the patient does not seem to have a nodular liver, and has no significant ascites.  Recommendation:  1. As previously suggested, I think this patient might benefit from a physical medicine/rehabilitation admission for strengthening and conditioning.  2. Thereafter, I think he would be a good candidate for inpatient long-term alcohol rehabilitation.  3.  His primary post hospitalization medical follow-up can be with his primary physician. At this time, he does not appear to have sufficient complexity of liver disease to necessitate ongoing GI follow-up. However, we will be available for backup consultation as needed, as time goes on.  4. I will sign off at this time, but would be happy to see the patient again at your request, or discuss his case at any time if it would be helpful to do so.  Cleotis Nipper, M.D. Pager [phone] If no answer or after 5 PM call [phone]

## 2014-10-16 NOTE — Consult Note (Signed)
[place]  Telephone:([phone]) [place] NOTE  [name]                                MR#: [id number]  DOB: [birth year]                        CSN#: [id number]  Patient Care Team: Christain Sacramento, MD as PCP - General (Family Medicine) Dustin Folks, MD as Referring Physician (Hematology and Oncology)   Referring MD: [facility]  I have seen the patient, examined him and edited the notes as follows   Reason for Consult: Thrombocytopenia  [name] 59 y.o. male admitted on 10/10/14 with 1 week history of dark stools and intermittent hematochezia. This is recurrent, with first episode in 2012, at which time a capsule endoscopy was unable to define the source of melena.  He denied any respiratory or cardiac complaints associated with it. He denied any dizziness or syncope. He denied any abdominal pain or nausea.  He was found to have abnormal CBC from admission with platelet count of 13,000. He had anemia with a Hb 8.1 and WBC 1.2, ANC 600. MCV on admission at 104 He reports easy bruising, but denies other bleeding issues such as hematemesis, epistaxis, or hematuria. The patient reports a history of liver cirrhosis dating back to 2 years, denies risk factors for HIV. He also reports a previous history of pancreatic lesion with suspicious cytology for possible neuroendocrine tumor, although no records are available and he cannot provide further details  He does partake alcohol for at least 25 years, about 12 pack a day. He also reports taking Aleve as outpatient for chronic back pain. Denies other recent new medications. Denies exposure to heparin, Lovenox. Denies any history of cardiac murmur or prior cardiovascular surgery .He denies prior platelet transfusions, but did have blood transfusions in the past. Patient had a hematological evaluation of pancytopenia (Dr. Wendee Beavers in [facility]), at which time a bone marrow biopsy was  performed which was felt to be secondary to alcohol abuse - will try to obtain records). He also went to [facility] for second opinion. Denies any ticks or other insect bites.   GI evaluation was obtained on 7/28, recommending close monitoring, as his GI bleed may have had been due to thrombocytopenia. A peripheral blood smear is unremarkable for a bone marrow process. We were requested to see the patient in consultation with recommendations.  PMH:  Past Medical History  Diagnosis Date  . GERD (gastroesophageal reflux disease)   . 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 Transfusion history 08-16-12    '99  . Pancreatitis 08-16-12    at present weakness,fatiques easily  . Foreign body 08-16-12    nose"BB" pellet since age 37  . History of ETOH abuse 08-16-12    Heavy alcohol use daily  . Anemia   . Anxiety   . Clotting disorder   . Substance abuse   . Cirrhosis     Surgeries:  Past Surgical History  Procedure Laterality Date  . Neck surgery      fusion of neck-titanium hardware inplaced  . Esophagogastroduodenoscopy    . Colonoscopy    . Eus N/A 08/18/2012    Procedure: UPPER ENDOSCOPIC ULTRASOUND (EUS) LINEAR;  Surgeon: Beryle Beams, MD;  Location: [facility] ENDOSCOPY;  Service: Endoscopy;  Laterality: N/A;  . Neck surgery  1540,0867    Allergies:  Allergies  Allergen Reactions  . Aspirin Other (See Comments)    Has upset stomach in high doses only. Took 4 aspirin with EMS, no reaction.    Medications:    prior to admission:  Prescriptions prior to admission  Medication Sig Dispense Refill Last Dose  . baclofen (LIORESAL) 10 MG tablet Take 10-20 mg by mouth at bedtime as needed for muscle spasms.    10/09/2014 at Unknown time  . calcium carbonate (TUMS - DOSED IN MG ELEMENTAL CALCIUM) 500 MG chewable tablet Chew 1 tablet by mouth daily.   Past Week at Unknown time  . folic acid (FOLVITE) 1 MG tablet Take 1 tablet (1 mg total) by mouth daily. 30 tablet 0 10/09/2014 at  Unknown time  . furosemide (LASIX) 80 MG tablet Take 1 tablet (80 mg total) by mouth daily. 30 tablet 0 10/09/2014 at Unknown time  . Hypromellose (ARTIFICIAL TEARS OP) Place 2 drops into the right eye daily as needed (irritation). For redness/itchy   10/09/2014 at Unknown time  . naproxen sodium (ANAPROX) 220 MG tablet Take 440 mg by mouth daily as needed (pain).   10/10/2014 at Unknown time  . spironolactone (ALDACTONE) 25 MG tablet Take 2 tablets (50 mg total) by mouth daily. (Patient taking differently: Take 25 mg by mouth daily. ) 60 tablet 0 10/09/2014 at Unknown time  . thiamine 100 MG tablet Take 1 tablet (100 mg total) by mouth daily. 30 tablet 0 10/09/2014 at Unknown time  . acetaminophen (TYLENOL) 325 MG tablet Take 2 tablets (650 mg total) by mouth every 8 (eight) hours as needed for mild pain, moderate pain, fever or headache (or Fever >/= 101).   unknown at unknown time    . sodium chloride   Intravenous Once  . antiseptic oral rinse  7 mL Mouth Rinse BID  . cefTRIAXone (ROCEPHIN)  IV  2 g Intravenous Q24H  . cyanocobalamin  1,000 mcg Intramuscular Daily  . folic acid  1 mg Oral Daily  . folic acid  1 mg Oral Daily  . furosemide  40 mg Intravenous Q12H  . multivitamin with minerals  1 tablet Oral Daily  . pantoprazole (PROTONIX) IV  40 mg Intravenous Q12H  . sodium chloride  3 mL Intravenous Q12H  . spironolactone  50 mg Oral Daily  . thiamine  100 mg Oral Daily   Or  . thiamine  100 mg Intravenous Daily    YPP:JKDTOI chloride, baclofen, morphine injection, ondansetron **OR** ondansetron (ZOFRAN) IV, oxyCODONE, polyvinyl alcohol, sodium chloride, traZODone  ROS: Constitutional: Denies fevers, chills or abnormal night sweats Eyes: Denies blurriness of vision, double vision or watery eyes Ears, nose, mouth, throat, and face: Denies mucositis or sore throat Respiratory: Denies cough, dyspnea or wheezes Cardiovascular: Denies palpitation, chest discomfort. He reports lower  extremity and chronic scrotal swelling Gastrointestinal:  Denies nausea, heartburn , reports melena and hematochezia Skin: Denies abnormal skin rashes, but reports easy bruising. Lymphatics: Denies new lymphadenopathy   Neurological:Denies numbness, tingling or new weaknesses Behavioral/Psych: Mood is stable, no new changes  All other systems were reviewed with the patient and are negative.    Family History:    Family History  Problem Relation Age of Onset  . Hypertension Father   . Cancer Mother   . Stroke Mother     No family history of hematological  disorders.  Social History:  reports that he quit smoking about 9  years ago. His smoking use included Cigarettes. He has never used smokeless tobacco. He reports that he drinks about 50.4 oz of alcohol per week. He reports that he does not use illicit drugs.  He is married, 3 children.   Physical Exam    Filed Vitals:   10/16/14 0427  BP: 112/67  Pulse: 88  Temp: 98.6 F (37 C)  Resp: 16   Filed Weights   10/14/14 0443 10/15/14 0559 10/16/14 0428  Weight: 177 lb 7.5 oz (80.5 kg) 177 lb 4 oz (80.4 kg) 175 lb 0.7 oz (79.4 kg)    GENERAL:alert, no distress and comfortable. He is obese SKIN: skin color, texture, turgor are normal, no rashes or significant lesions. Ashed appearance. EYES: normal, conjunctiva are pink and non-injected, sclera clear. Right orbital discoloration noted. OROPHARYNX:no exudate, no erythema and lips, buccal mucosa, and tongue normal  NECK: supple, thyroid normal size, non-tender, without nodularity LYMPH:  no palpable lymphadenopathy in the cervical, axillary or inguinal area LUNGS: decreased breath sounds at the left base with normal breathing effort HEART: regular rate & rhythm and no murmurs and lower extremity edema ABDOMEN: soft, non-tender and normal bowel sounds Musculoskeletal:no cyanosis of digits and no clubbing  PSYCH: alert & oriented x 3 with fluent speech NEURO: no focal  motor/sensory deficits GENITALIA: noted scrotal edema  Labs:    Recent Labs Lab 10/10/14 1528  10/13/14 1153 10/13/14 2037 10/14/14 1015 10/15/14 0429 10/16/14 0453  WBC 1.2*  < > 2.8* 3.8* 2.8* 2.1* 1.4*  HGB 8.1*  < > 9.0* 9.3* 8.8* 8.7* 8.7*  HCT 23.7*  < > 26.2* 27.0* 25.5* 25.7* 25.7*  PLT 13*  < > 40* 25* 20* 20* 18*  MCV 103.5*  < > 101.6* 102.7* 102.8* 103.6* 103.6*  MCH 35.4*  < > 34.9* 35.4* 35.5* 35.1* 35.1*  MCHC 34.2  < > 34.4 34.4 34.5 33.9 33.9  RDW 13.5  < > 13.9 13.8 13.8 13.7 13.6  LYMPHSABS 0.5*  --   --   --  0.4* 0.3* 0.3*  MONOABS 0.2  --   --   --  0.3 0.3 0.3  EOSABS 0.0  --   --   --  0.0 0.0 0.0  BASOSABS 0.0  --   --   --  0.0 0.0 0.0  < > = values in this interval not displayed.    Results from outpatient visit  09/19/14   Ferritin 1180.0 (H) 27.0-377.0 ng/mL  Vitamin B-12 540 193-900 pg/ml  Folate 08/15/14 1166.7 HIV 08/2014 NR Hep panel 08/15/14 reactive for Hep A (IgG and IgM antibodies), negative for B or C Copper normal at 93  Recent Labs Lab 10/10/14 1528 10/11/14 0505 10/14/14 1015 10/15/14 0429 10/15/14 1250 10/16/14 0453  NA 133* 137 131* 132* 131* 133*  K 4.1 3.8 3.0* 3.2* 3.5 3.3*  CL 99* 101 94* 95* 93* 96*  CO2 '27 30 31 31 ' 32 29  GLUCOSE 114* 82 91 97 157* 96  BUN '7 7 11 16 16 ' 21*  CREATININE 0.57* 0.58* 0.69 0.76 0.96 0.89  CALCIUM 8.4* 8.3* 7.5* 7.7* 8.0* 7.6*  AST 184* 159*  --   --   --   --   ALT 42 38  --   --   --   --   ALKPHOS 145* 123  --   --   --   --   BILITOT 1.5* 1.6*  --   --   --   --  Component Value Date/Time   BILITOT 1.6* 10/11/2014 0505      Recent Labs Lab 10/10/14 1528  INR 1.49    No results for input(s): DDIMER in the last 72 hours.   Anemia panel:  No results for input(s): VITAMINB12, FOLATE, FERRITIN, TIBC, IRON, RETICCTPCT in the last 72 hours.  Urinalysis    Component Value Date/Time   COLORURINE YELLOW 10/11/2014 1818   APPEARANCEUR CLOUDY* 10/11/2014 1818    LABSPEC 1.012 10/11/2014 1818   PHURINE 6.0 10/11/2014 1818   GLUCOSEU NEGATIVE 10/11/2014 1818   HGBUR TRACE* 10/11/2014 1818   BILIRUBINUR NEGATIVE 10/11/2014 1818   KETONESUR NEGATIVE 10/11/2014 1818   PROTEINUR NEGATIVE 10/11/2014 1818   UROBILINOGEN 0.2 10/11/2014 1818   NITRITE NEGATIVE 10/11/2014 1818   LEUKOCYTESUR NEGATIVE 10/11/2014 1818    Drugs of Abuse     Component Value Date/Time   LABOPIA POSITIVE* 07/30/2014 2132   COCAINSCRNUR NONE DETECTED 07/30/2014 2132   LABBENZ NONE DETECTED 07/30/2014 2132   AMPHETMU NONE DETECTED 07/30/2014 2132   THCU NONE DETECTED 07/30/2014 2132   LABBARB NONE DETECTED 07/30/2014 2132     Imaging Studies:  Dg Chest 2 View  10/10/2014   CLINICAL DATA:  Cough.  Shortness breath.  Weakness and fatigue.  EXAM: CHEST - 2 VIEW  COMPARISON:  Two-view chest x-ray 05/10/2014.  FINDINGS: The heart size is upper limits of normal. Mild pulmonary vascular congestion is noted. Small bilateral pleural effusions are present, left greater than right. There is mild associated airspace disease likely reflecting atelectasis the upper lung fields are clear. The visualized soft tissues and bony thorax are remarkable for cervical spine surgery. Exaggerated thoracic kyphosis is noted.  IMPRESSION: 1. Borderline cardiomegaly with pulmonary vascular congestion and new bilateral effusions is concerning for early congestive heart failure. 2. Mild bibasilar airspace disease likely reflects atelectasis associated with the effusions.   Electronically Signed   By: San Morelle M.D.   On: 10/10/2014 15:49   US Abdomen Complete  10/15/2014   CLINICAL DATA:  Liver disease  EXAM: ULTRASOUND ABDOMEN COMPLETE  COMPARISON:  07/31/2014  FINDINGS: Gallbladder: The gallbladder wall remains thickened with a maximal diameter of 4 mm. There continues to be edema adjacent to the gallbladder within the liver. No gallstones are present. Negative Murphy sign.  Common bile duct:  Diameter: 6 mm in caliber.  Liver: There is minimal nodularity of the contour but no focal liver mass. Homogeneous echotexture throughout the liver.  IVC: No abnormality visualized.  Pancreas: The pancreas was obscured by overlying bowel gas.  Spleen: The spleen is enlarged with a maximal diameter of 15 cm. Volume is estimated at 825 cc. No focal mass.  Right Kidney: Length: 14.2 cm. Echogenicity within normal limits. No mass or hydronephrosis visualized.  Left Kidney: Length: 12.4 cm. Echogenicity within normal limits. No mass or hydronephrosis visualized.  Abdominal aorta: Portions of the aorta were obscured. Maximal visualized diameter was 3.0 cm.  Other findings: Small amount of free fluid about the liver.  IMPRESSION: Stable gallbladder wall thickening with adjacent edema but no stones. No Murphy sign.  Small amount of free fluid.  Pancreas and portions of the aorta were obscured.  Maximal aortic caliber was 3.0 cm. Recommend followup by ultrasound in 3 years. This recommendation follows ACR consensus guidelines: White Paper of the ACR Incidental Findings Committee II on Vascular Findings. J Am Coll Radiol 2013; 10:789-794.   Electronically Signed   By: Marybelle Killings M.D.   On: 10/15/2014 10:32  I reviewed his smear with a pathologist. Noted absolute leukopenia and thrombocytopenia. No clumping or schistocytes were seen. WBC normal morphology  A/P: 59 y.o. male with :  Thrombocytopenia on background history of liver disease, splenomegaly and alcohol abuse Cause is multi-factorial, likely due to consumption from recent bleeding, with depressed production from recent alcohol abuse, liver disease and splenomegaly. From his records from [facility], his baseline is between 90s and 100s Smear is unremarkable for schistocytes Since admission he received  2 units of platelets with good response.  Abdominal ultrasound remarkable for enlarged spleen. New B12 and Folate are pending Avoid heparin  products Patient has been seen as outpatient by a hematologist in [facility] and had bone marrow biopsy which ruled out MDS Recommend outpatient follow up with Dr.Chinnasami once discharged  Transfuse prn for bleeding or platelet less than 10,000  Anemia In the setting of GI Bleed, chronic disease He received 1 unit of blood on admission EGD by GI is on hold until thrombocytopenia is improved. Transfuse prn for hemoglobin <7  Neutropenia In the setting of alcohol abuse & splenomegaly No treatment is needed  Alcohol abuse Alcohol cesation program recommended  DVT prophylaxis On mechanical device  Full Code  Discharge planning Defer to primary service No follow-up with me since [place] Call if questions arise  [employer] E, PA-C 10/16/2014 10:47 AM  Dane Kopke, MD 10/16/2014

## 2014-10-17 DIAGNOSIS — K7030 Alcoholic cirrhosis of liver without ascites: Secondary | ICD-10-CM

## 2014-10-17 LAB — BASIC METABOLIC PANEL
ANION GAP: 7 (ref 5–15)
BUN: 20 mg/dL (ref 6–20)
CALCIUM: 8 mg/dL — AB (ref 8.9–10.3)
CO2: 30 mmol/L (ref 22–32)
CREATININE: 0.84 mg/dL (ref 0.61–1.24)
Chloride: 97 mmol/L — ABNORMAL LOW (ref 101–111)
GFR calc Af Amer: >60 mL/min (ref >60)
GFR calc non Af Amer: >60 mL/min (ref >60)
Glucose, Bld: 107 mg/dL — ABNORMAL HIGH (ref 65–99)
Potassium: 3.6 mmol/L (ref 3.5–5.1)
Sodium: 134 mmol/L — ABNORMAL LOW (ref 135–145)

## 2014-10-17 LAB — CBC WITH DIFFERENTIAL/PLATELET
BASOS ABS: 0 10*3/uL (ref 0.0–0.1)
BASOS PCT: 1 % (ref 0–1)
Eosinophils Absolute: 0 10*3/uL (ref 0.0–0.7)
Eosinophils Relative: 2 % (ref 0–5)
HCT: 25.6 % — ABNORMAL LOW (ref 39.0–52.0)
HEMOGLOBIN: 8.6 g/dL — AB (ref 13.0–17.0)
LYMPHS ABS: 0.4 10*3/uL — AB (ref 0.7–4.0)
Lymphocytes Relative: 18 % (ref 12–46)
MCH: 35 pg — AB (ref 26.0–34.0)
MCHC: 33.6 g/dL (ref 30.0–36.0)
MCV: 104.1 fL — AB (ref 78.0–100.0)
MONO ABS: 0.5 10*3/uL (ref 0.1–1.0)
Monocytes Relative: 23 % — ABNORMAL HIGH (ref 3–12)
NEUTROS ABS: 1.1 10*3/uL — AB (ref 1.7–7.7)
Neutrophils Relative %: 56 % (ref 43–77)
Platelets: 28 10*3/uL — CL (ref 150–400)
RBC: 2.46 MIL/uL — ABNORMAL LOW (ref 4.22–5.81)
RDW: 13.6 % (ref 11.5–15.5)
WBC: 2 10*3/uL — AB (ref 4.0–10.5)

## 2014-10-17 LAB — FOLATE RBC
Folate, Hemolysate: 400.1 ng/mL
Folate, RBC: 1569 ng/mL (ref >498)
Hematocrit: 25.5 % — ABNORMAL LOW (ref 37.5–51.0)

## 2014-10-17 MED ORDER — SPIRONOLACTONE 100 MG PO TABS: 100.0000 mg | ORAL_TABLET | Freq: Every day | ORAL | Status: DC

## 2014-10-17 NOTE — Discharge Summary (Signed)
Physician Discharge Summary  [name] UEA:[id number] DOB: [birth year] DOA: 10/10/2014  PCP: Woody Seller, MD  Admit date: 10/10/2014 Discharge date: 10/17/2014  Time spent: 35 minutes  Recommendations for Outpatient Follow-up:  1. Follow-up with his primary care doctor in 1 week check a basic metabolic panel.  Discharge Diagnoses:  Active Problems:   Anemia   Alcohol abuse   Thrombocytopenia   GI bleed   Cirrhosis of liver   Pulmonary edema   Pancytopenia   Alcoholic cirrhosis of liver with ascites   Acute blood loss anemia   Upper GI bleed   Discharge Condition: stable  Diet recommendation: Low sodium diet.  Filed Weights   10/15/14 0559 10/16/14 0428 10/17/14 0527  Weight: 80.4 kg (177 lb 4 oz) 79.4 kg (175 lb 0.7 oz) 78.6 kg (173 lb 4.5 oz)    History of present illness:  59 year old with past medical history of cirrhosis due to alcohol abuse and pancreatitis that went to his PCP due to shortness of breath PCP labs show low platelets so he was transferred to the ED  Hospital Course:  Melena due to upper GI bleeding in the setting of thrombocytopenia and cirrhosis/acute blood loss anemia/AOCD: - His platelets on admission were 13, He received 2 units of platelets on admission and 1 unit of packed red blood cells. - GI was consulted they recommended no EGD, as the most likely causes GI bleed was thrombocytopenia. He was monitored in the hospital and his platelet remained stable. And he had no further episodes of bleeding. - Oncology was consulted that relates that his thrombocytopenia is likely due to's splenic sequestration - He will continue Protonix orally. - B-12 level was 780.  Acute pulmonary edema with acute respiratory failure with hypoxia/Liver cirrhosis/anasarca/ Alcoholic cirrhosis of liver with ascites: - He was continued on his Lasix and his Aldactone was increased. - His blood pressure remained stable.  Neutropenia: - Likely due to EtOH abuse,  slowly improving.  Thrombocytopenia - Consult hematology. - Hematology recommended to monitor and transfuse of bleeding or platelets less than 10. His platelets have been trending up.  Acute alcohol intoxication  - He was monitored with serial protocol here mild withdrawal symptoms. - He was counseled heavily heavily not drinking  Hyponatremia: Likely due to liver cirrhosis, on spironolactone  Procedures:  Abdominal ultrasound enlarged spleen.  Consultations:  Hematology GI  Discharge Exam: Filed Vitals:   10/17/14 0527  BP: 115/67  Pulse: 95  Temp: 98.9 F (37.2 C)  Resp: 16    General: A&O x3 Cardiovascular: RRR Respiratory: good air movement CTA B/L  Discharge Instructions   Discharge Instructions    Diet - low sodium heart healthy    Complete by:  As directed      Increase activity slowly    Complete by:  As directed           Current Discharge Medication List    CONTINUE these medications which have CHANGED   Details  spironolactone (ALDACTONE) 100 MG tablet Take 1 tablet (100 mg total) by mouth daily. Qty: 30 tablet, Refills: 0      CONTINUE these medications which have NOT CHANGED   Details  baclofen (LIORESAL) 10 MG tablet Take 10-20 mg by mouth at bedtime as needed for muscle spasms.     calcium carbonate (TUMS - DOSED IN MG ELEMENTAL CALCIUM) 500 MG chewable tablet Chew 1 tablet by mouth daily.    folic acid (FOLVITE) 1 MG tablet Take 1 tablet (1 mg  total) by mouth daily. Qty: 30 tablet, Refills: 0    furosemide (LASIX) 80 MG tablet Take 1 tablet (80 mg total) by mouth daily. Qty: 30 tablet, Refills: 0    Hypromellose (ARTIFICIAL TEARS OP) Place 2 drops into the right eye daily as needed (irritation). For redness/itchy    naproxen sodium (ANAPROX) 220 MG tablet Take 440 mg by mouth daily as needed (pain).    thiamine 100 MG tablet Take 1 tablet (100 mg total) by mouth daily. Qty: 30 tablet, Refills: 0    acetaminophen (TYLENOL) 325  MG tablet Take 2 tablets (650 mg total) by mouth every 8 (eight) hours as needed for mild pain, moderate pain, fever or headache (or Fever >/= 101).       Allergies  Allergen Reactions  . Aspirin Other (See Comments)    Has upset stomach in high doses only. Took 4 aspirin with EMS, no reaction.   Follow-up Information    Follow up with Woody Seller, MD In 1 week.   Specialty:  Family Medicine   Why:  hospital follow up   Contact information:   [address] [place] [id number] [phone]        The results of significant diagnostics from this hospitalization (including imaging, microbiology, ancillary and laboratory) are listed below for reference.    Significant Diagnostic Studies: Dg Chest 2 View  10/10/2014   CLINICAL DATA:  Cough.  Shortness breath.  Weakness and fatigue.  EXAM: CHEST - 2 VIEW  COMPARISON:  Two-view chest x-ray 05/10/2014.  FINDINGS: The heart size is upper limits of normal. Mild pulmonary vascular congestion is noted. Small bilateral pleural effusions are present, left greater than right. There is mild associated airspace disease likely reflecting atelectasis the upper lung fields are clear. The visualized soft tissues and bony thorax are remarkable for cervical spine surgery. Exaggerated thoracic kyphosis is noted.  IMPRESSION: 1. Borderline cardiomegaly with pulmonary vascular congestion and new bilateral effusions is concerning for early congestive heart failure. 2. Mild bibasilar airspace disease likely reflects atelectasis associated with the effusions.   Electronically Signed   By: San Morelle M.D.   On: 10/10/2014 15:49   US Abdomen Complete  10/15/2014   CLINICAL DATA:  Liver disease  EXAM: ULTRASOUND ABDOMEN COMPLETE  COMPARISON:  07/31/2014  FINDINGS: Gallbladder: The gallbladder wall remains thickened with a maximal diameter of 4 mm. There continues to be edema adjacent to the gallbladder within the liver. No gallstones are present.  Negative Murphy sign.  Common bile duct: Diameter: 6 mm in caliber.  Liver: There is minimal nodularity of the contour but no focal liver mass. Homogeneous echotexture throughout the liver.  IVC: No abnormality visualized.  Pancreas: The pancreas was obscured by overlying bowel gas.  Spleen: The spleen is enlarged with a maximal diameter of 15 cm. Volume is estimated at 825 cc. No focal mass.  Right Kidney: Length: 14.2 cm. Echogenicity within normal limits. No mass or hydronephrosis visualized.  Left Kidney: Length: 12.4 cm. Echogenicity within normal limits. No mass or hydronephrosis visualized.  Abdominal aorta: Portions of the aorta were obscured. Maximal visualized diameter was 3.0 cm.  Other findings: Small amount of free fluid about the liver.  IMPRESSION: Stable gallbladder wall thickening with adjacent edema but no stones. No Murphy sign.  Small amount of free fluid.  Pancreas and portions of the aorta were obscured.  Maximal aortic caliber was 3.0 cm. Recommend followup by ultrasound in 3 years. This recommendation follows ACR  consensus guidelines: White Paper of the ACR Incidental Findings Committee II on Vascular Findings. J Am Coll Radiol 2013; 10:789-794.   Electronically Signed   By: Marybelle Killings M.D.   On: 10/15/2014 10:32    Microbiology: Recent Results (from the past 240 hour(s))  MRSA PCR Screening     Status: None   Collection Time: 10/10/14 11:12 PM  Result Value Ref Range Status   MRSA by PCR NEGATIVE NEGATIVE Final    Comment:        The GeneXpert MRSA Assay (FDA approved for NASAL specimens only), is one component of a comprehensive MRSA colonization surveillance program. It is not intended to diagnose MRSA infection nor to guide or monitor treatment for MRSA infections.   Culture, blood (x 2)     Status: None   Collection Time: 10/11/14  5:50 PM  Result Value Ref Range Status   Specimen Description BLOOD RIGHT HAND  Final   Special Requests BOTTLES DRAWN AEROBIC AND  ANAEROBIC 10CC  Final   Culture   Final    NO GROWTH 5 DAYS Performed at [facility]    Report Status 10/16/2014 FINAL  Final  Culture, blood (x 2)     Status: None   Collection Time: 10/11/14  6:05 PM  Result Value Ref Range Status   Specimen Description BLOOD RIGHT ARM  Final   Special Requests IN PEDIATRIC BOTTLE 4CC  Final   Culture   Final    NO GROWTH 5 DAYS Performed at [facility]    Report Status 10/16/2014 FINAL  Final  Culture, Urine     Status: None   Collection Time: 10/11/14  6:18 PM  Result Value Ref Range Status   Specimen Description URINE, CLEAN CATCH  Final   Special Requests NONE  Final   Culture   Final    NO GROWTH 2 DAYS Performed at [facility]    Report Status 10/13/2014 FINAL  Final     Labs: Basic Metabolic Panel:  Recent Labs Lab 10/14/14 1015 10/15/14 0429 10/15/14 1250 10/16/14 0453 10/16/14 1110 10/17/14 0342  NA 131* 132* 131* 133*  --  134*  K 3.0* 3.2* 3.5 3.3*  --  3.6  CL 94* 95* 93* 96*  --  97*  CO2 31 31 32 29  --  30  GLUCOSE 91 97 157* 96  --  107*  BUN 11 16 16  21*  --  20  CREATININE 0.69 0.76 0.96 0.89  --  0.84  CALCIUM 7.5* 7.7* 8.0* 7.6*  --  8.0*  MG  --   --   --   --  1.0*  --    Liver Function Tests:  Recent Labs Lab 10/10/14 1528 10/11/14 0505  AST 184* 159*  ALT 42 38  ALKPHOS 145* 123  BILITOT 1.5* 1.6*  PROT 7.8 7.3  ALBUMIN 2.5* 2.4*   No results for input(s): LIPASE, AMYLASE in the last 168 hours. No results for input(s): AMMONIA in the last 168 hours. CBC:  Recent Labs Lab 10/10/14 1528  10/13/14 2037 10/14/14 1015 10/15/14 0429 10/16/14 0453 10/17/14 0342  WBC 1.2*  < > 3.8* 2.8* 2.1* 1.4* 2.0*  NEUTROABS 0.5*  --   --  2.1 1.4* 0.8* 1.1*  HGB 8.1*  < > 9.3* 8.8* 8.7* 8.7* 8.6*  HCT 23.7*  < > 27.0* 25.5* 25.7* 25.7* 25.6*  MCV 103.5*  < > 102.7* 102.8* 103.6* 103.6* 104.1*  PLT 13*  < >  25* 20* 20* 18* 28*  < > = values in this interval not  displayed. Cardiac Enzymes: No results for input(s): CKTOTAL, CKMB, CKMBINDEX, TROPONINI in the last 168 hours. BNP: BNP (last 3 results)  Recent Labs  10/10/14 1702  BNP 120.9*    ProBNP (last 3 results) No results for input(s): PROBNP in the last 8760 hours.  CBG: No results for input(s): GLUCAP in the last 168 hours.   Signed:  Charlynne Cousins  [facility] 10/17/2014, 10:14 AM

## 2014-11-15 ENCOUNTER — Other Ambulatory Visit: Payer: Self-pay | Admitting: Urology

## 2014-12-02 NOTE — Patient Instructions (Signed)
Matthews Franks  12/02/2014   Your procedure is scheduled on: Friday 12/13/2014  Report to [facility] Main  Entrance take [place]  elevators to 3rd floor to  [place] at  [place] AM.  Call this number if you have problems the morning of surgery [phone]   Remember: ONLY 1 PERSON MAY GO WITH YOU TO SHORT STAY TO GET  READY MORNING OF [place].  Do not eat food or drink liquids :After Midnight.     Take these medicines the morning of surgery with A SIP OF WATER: USE ARTIFICAL TEARS IF NEEDED                               You may not have any metal on your body including hair pins and              piercings  Do not wear jewelry, make-up, lotions, powders or perfumes, deodorant             Do not wear nail polish.  Do not shave  48 hours prior to surgery.              Men may shave face and neck.   Do not bring valuables to the hospital. [place].  Contacts, dentures or bridgework may not be worn into surgery.  Leave suitcase in the car. After surgery it may be brought to your room.     Patients discharged the day of surgery will not be allowed to drive home.  Name and phone number of your driver:  Special Instructions: N/A              Please read over the following fact sheets you were given: _____________________________________________________________________             [facility] - Preparing for Surgery Before surgery, you can play an important role.  Because skin is not sterile, your skin needs to be as free of germs as possible.  You can reduce the number of germs on your skin by washing with CHG (chlorahexidine gluconate) soap before surgery.  CHG is an antiseptic cleaner which kills germs and bonds with the skin to continue killing germs even after washing. Please DO NOT use if you have an allergy to CHG or antibacterial soaps.  If your skin becomes reddened/irritated stop using the CHG and  inform your nurse when you arrive at Short Stay. Do not shave (including legs and underarms) for at least 48 hours prior to the first CHG shower.  You may shave your face/neck. Please follow these instructions carefully:  1.  Shower with CHG Soap the night before surgery and the  morning of Surgery.  2.  If you choose to wash your hair, wash your hair first as usual with your  normal  shampoo.  3.  After you shampoo, rinse your hair and body thoroughly to remove the  shampoo.                           4.  Use CHG as you would any other liquid soap.  You can apply chg directly  to the skin and wash  Gently with a scrungie or clean washcloth.  5.  Apply the CHG Soap to your body ONLY FROM THE NECK DOWN.   Do not use on face/ open                           Wound or open sores. Avoid contact with eyes, ears mouth and genitals (private parts).                       Wash face,  Genitals (private parts) with your normal soap.             6.  Wash thoroughly, paying special attention to the area where your surgery  will be performed.  7.  Thoroughly rinse your body with warm water from the neck down.  8.  DO NOT shower/wash with your normal soap after using and rinsing off  the CHG Soap.                9.  Pat yourself dry with a clean towel.            10.  Wear clean pajamas.            11.  Place clean sheets on your bed the night of your first shower and do not  sleep with pets. Day of Surgery : Do not apply any lotions/deodorants the morning of surgery.  Please wear clean clothes to the hospital/surgery center.  FAILURE TO FOLLOW THESE INSTRUCTIONS MAY RESULT IN THE CANCELLATION OF YOUR SURGERY PATIENT SIGNATURE_________________________________  NURSE SIGNATURE__________________________________  ________________________________________________________________________

## 2014-12-05 ENCOUNTER — Encounter (HOSPITAL_COMMUNITY): Payer: Self-pay

## 2014-12-05 ENCOUNTER — Encounter (HOSPITAL_COMMUNITY)
Admission: RE | Admit: 2014-12-05 | Discharge: 2014-12-05 | Disposition: A | Discharge status: Home / Self Care | Payer: 59 | Source: Ambulatory Visit | Attending: Urology | Admitting: Urology

## 2014-12-05 DIAGNOSIS — Z01818 Encounter for other preprocedural examination: Secondary | ICD-10-CM | POA: Insufficient documentation

## 2014-12-05 LAB — BASIC METABOLIC PANEL
Anion gap: 7 (ref 5–15)
BUN: 14 mg/dL (ref 6–20)
CALCIUM: 9 mg/dL (ref 8.9–10.3)
CHLORIDE: 99 mmol/L — AB (ref 101–111)
CO2: 23 mmol/L (ref 22–32)
CREATININE: 0.59 mg/dL — AB (ref 0.61–1.24)
Glucose, Bld: 143 mg/dL — ABNORMAL HIGH (ref 65–99)
Potassium: 4.4 mmol/L (ref 3.5–5.1)
Sodium: 129 mmol/L — ABNORMAL LOW (ref 135–145)

## 2014-12-05 LAB — CBC
HEMATOCRIT: 24.8 % — AB (ref 39.0–52.0)
Hemoglobin: 8.4 g/dL — ABNORMAL LOW (ref 13.0–17.0)
MCH: 34.7 pg — ABNORMAL HIGH (ref 26.0–34.0)
MCHC: 33.9 g/dL (ref 30.0–36.0)
MCV: 102.5 fL — ABNORMAL HIGH (ref 78.0–100.0)
Platelets: 31 10*3/uL — ABNORMAL LOW (ref 150–400)
RBC: 2.42 MIL/uL — ABNORMAL LOW (ref 4.22–5.81)
RDW: 15.7 % — ABNORMAL HIGH (ref 11.5–15.5)
WBC: 1.9 10*3/uL — AB (ref 4.0–10.5)

## 2014-12-05 NOTE — Progress Notes (Signed)
Called Darrel Reach, scheduler for [name] at [facility] and informed her that I routed lab results ( CBC and BMP)  to Dr. Carlton Adam in box in [facility] . Consulted Dr. Landry Dyke about Platelet results and per Dr. Landry Dyke , patient needs to have platelets given morning of surgery before surgery and Platelet count drawn am of surgery. Pam was going to talk to [name].

## 2014-12-05 NOTE — Progress Notes (Signed)
Called patient to inform him that surgery time had been changed and he needed to report to Short Stay at [facility]  as of now at 1030 am on Friday 12/13/2014. Patient verbalized understanding.

## 2014-12-06 ENCOUNTER — Other Ambulatory Visit (HOSPITAL_COMMUNITY): Payer: 59

## 2014-12-11 NOTE — Progress Notes (Signed)
Left message for Pam , scheduler for [name] to call me and let me know if she talked to [name] and informed him of Dr. Landry Dyke , Anesthesia having seen patient at pre-op appointment and patient needs to have Platelets administered to him prior to surgery on the morning of. Had talked to [facility] on Thursday 12/05/2014 and letting her know what Anesthesia needed for this patient.

## 2014-12-11 NOTE — Progress Notes (Signed)
Spoke with Dr. Orene Desanctis in Anesthesia and patient needs 1 pack platelets infused prior to surgery on 12/13/2014, order placed in EPIC for transfusion of platelets.

## 2014-12-13 ENCOUNTER — Encounter (HOSPITAL_COMMUNITY): Admission: RE | Payer: Self-pay | Source: Ambulatory Visit

## 2014-12-13 ENCOUNTER — Ambulatory Visit (HOSPITAL_COMMUNITY): Admission: RE | Admit: 2014-12-13 | Payer: 59 | Source: Ambulatory Visit | Admitting: Urology

## 2014-12-13 SURGERY — HYDROCELECTOMY
Anesthesia: General | Laterality: Right

## 2015-02-12 ENCOUNTER — Encounter (HOSPITAL_COMMUNITY): Payer: Self-pay

## 2015-02-12 ENCOUNTER — Emergency Department (HOSPITAL_COMMUNITY): Payer: 59

## 2015-02-12 ENCOUNTER — Inpatient Hospital Stay (HOSPITAL_COMMUNITY)
Admission: EM | Admit: 2015-02-12 | Discharge: 2015-02-17 | DRG: 896 | Disposition: A | Discharge status: Home Health | Payer: 59 | Attending: Internal Medicine | Admitting: Internal Medicine

## 2015-02-12 DIAGNOSIS — E876 Hypokalemia: Secondary | ICD-10-CM | POA: Diagnosis present

## 2015-02-12 DIAGNOSIS — Z823 Family history of stroke: Secondary | ICD-10-CM

## 2015-02-12 DIAGNOSIS — R740 Nonspecific elevation of levels of transaminase and lactic acid dehydrogenase [LDH]: Secondary | ICD-10-CM | POA: Diagnosis present

## 2015-02-12 DIAGNOSIS — D649 Anemia, unspecified: Secondary | ICD-10-CM | POA: Diagnosis not present

## 2015-02-12 DIAGNOSIS — G9341 Metabolic encephalopathy: Secondary | ICD-10-CM | POA: Diagnosis present

## 2015-02-12 DIAGNOSIS — Z886 Allergy status to analgesic agent status: Secondary | ICD-10-CM

## 2015-02-12 DIAGNOSIS — E871 Hypo-osmolality and hyponatremia: Secondary | ICD-10-CM | POA: Diagnosis present

## 2015-02-12 DIAGNOSIS — N179 Acute kidney failure, unspecified: Secondary | ICD-10-CM

## 2015-02-12 DIAGNOSIS — Z79899 Other long term (current) drug therapy: Secondary | ICD-10-CM | POA: Diagnosis not present

## 2015-02-12 DIAGNOSIS — K219 Gastro-esophageal reflux disease without esophagitis: Secondary | ICD-10-CM | POA: Diagnosis present

## 2015-02-12 DIAGNOSIS — D61818 Other pancytopenia: Secondary | ICD-10-CM | POA: Diagnosis present

## 2015-02-12 DIAGNOSIS — Z809 Family history of malignant neoplasm, unspecified: Secondary | ICD-10-CM

## 2015-02-12 DIAGNOSIS — F1010 Alcohol abuse, uncomplicated: Secondary | ICD-10-CM | POA: Diagnosis not present

## 2015-02-12 DIAGNOSIS — R443 Hallucinations, unspecified: Secondary | ICD-10-CM | POA: Diagnosis present

## 2015-02-12 DIAGNOSIS — R0602 Shortness of breath: Secondary | ICD-10-CM

## 2015-02-12 DIAGNOSIS — G9340 Encephalopathy, unspecified: Secondary | ICD-10-CM

## 2015-02-12 DIAGNOSIS — F419 Anxiety disorder, unspecified: Secondary | ICD-10-CM | POA: Diagnosis present

## 2015-02-12 DIAGNOSIS — Z87891 Personal history of nicotine dependence: Secondary | ICD-10-CM | POA: Diagnosis not present

## 2015-02-12 DIAGNOSIS — F1020 Alcohol dependence, uncomplicated: Secondary | ICD-10-CM | POA: Diagnosis present

## 2015-02-12 DIAGNOSIS — Z8249 Family history of ischemic heart disease and other diseases of the circulatory system: Secondary | ICD-10-CM | POA: Diagnosis not present

## 2015-02-12 DIAGNOSIS — R410 Disorientation, unspecified: Secondary | ICD-10-CM | POA: Diagnosis present

## 2015-02-12 DIAGNOSIS — D539 Nutritional anemia, unspecified: Secondary | ICD-10-CM | POA: Diagnosis present

## 2015-02-12 DIAGNOSIS — F10121 Alcohol abuse with intoxication delirium: Principal | ICD-10-CM | POA: Diagnosis present

## 2015-02-12 DIAGNOSIS — K7030 Alcoholic cirrhosis of liver without ascites: Secondary | ICD-10-CM

## 2015-02-12 DIAGNOSIS — F10959 Alcohol use, unspecified with alcohol-induced psychotic disorder, unspecified: Secondary | ICD-10-CM

## 2015-02-12 DIAGNOSIS — M130 Polyarthritis, unspecified: Secondary | ICD-10-CM | POA: Diagnosis present

## 2015-02-12 DIAGNOSIS — F10921 Alcohol use, unspecified with intoxication delirium: Secondary | ICD-10-CM | POA: Diagnosis not present

## 2015-02-12 DIAGNOSIS — K7460 Unspecified cirrhosis of liver: Secondary | ICD-10-CM | POA: Diagnosis present

## 2015-02-12 DIAGNOSIS — D696 Thrombocytopenia, unspecified: Secondary | ICD-10-CM

## 2015-02-12 LAB — COMPREHENSIVE METABOLIC PANEL
ALT: 63 U/L (ref 17–63)
ANION GAP: 9 (ref 5–15)
AST: 199 U/L — ABNORMAL HIGH (ref 15–41)
Albumin: 3.4 g/dL — ABNORMAL LOW (ref 3.5–5.0)
Alkaline Phosphatase: 98 U/L (ref 38–126)
BUN: 29 mg/dL — ABNORMAL HIGH (ref 6–20)
CHLORIDE: 79 mmol/L — AB (ref 101–111)
CO2: 32 mmol/L (ref 22–32)
Calcium: 8.8 mg/dL — ABNORMAL LOW (ref 8.9–10.3)
Creatinine, Ser: 1.39 mg/dL — ABNORMAL HIGH (ref 0.61–1.24)
GFR calc non Af Amer: 54 mL/min — ABNORMAL LOW (ref >60)
Glucose, Bld: 202 mg/dL — ABNORMAL HIGH (ref 65–99)
Potassium: 2.5 mmol/L — CL (ref 3.5–5.1)
SODIUM: 120 mmol/L — AB (ref 135–145)
Total Bilirubin: 1.7 mg/dL — ABNORMAL HIGH (ref 0.3–1.2)
Total Protein: 7.6 g/dL (ref 6.5–8.1)

## 2015-02-12 LAB — CBC
HEMATOCRIT: 26.3 % — AB (ref 39.0–52.0)
HEMOGLOBIN: 9.2 g/dL — AB (ref 13.0–17.0)
MCH: 33.5 pg (ref 26.0–34.0)
MCHC: 35 g/dL (ref 30.0–36.0)
MCV: 95.6 fL (ref 78.0–100.0)
Platelets: 77 10*3/uL — ABNORMAL LOW (ref 150–400)
RBC: 2.75 MIL/uL — AB (ref 4.22–5.81)
RDW: 13.2 % (ref 11.5–15.5)
WBC: 3.6 10*3/uL — ABNORMAL LOW (ref 4.0–10.5)

## 2015-02-12 LAB — MAGNESIUM: Magnesium: 1.8 mg/dL (ref 1.7–2.4)

## 2015-02-12 LAB — LACTIC ACID, PLASMA
Lactic Acid, Venous: 1.3 mmol/L (ref 0.5–2.0)
Lactic Acid, Venous: 1.2 mmol/L (ref 0.5–2.0)

## 2015-02-12 LAB — ETHANOL: Alcohol, Ethyl (B): <5 mg/dL (ref <5)

## 2015-02-12 LAB — ACETAMINOPHEN LEVEL

## 2015-02-12 LAB — TSH: TSH: 1.055 u[IU]/mL (ref 0.350–4.500)

## 2015-02-12 LAB — AMMONIA: Ammonia: 36 umol/L — ABNORMAL HIGH (ref 9–35)

## 2015-02-12 MED ORDER — LORAZEPAM 2 MG/ML IJ SOLN
0.0000 mg | Freq: Four times a day (QID) | INTRAMUSCULAR | Status: DC
  Administered 2015-02-13: 2 mg via INTRAVENOUS
  Filled 2015-02-12 (×2): qty 1

## 2015-02-12 MED ORDER — POTASSIUM CHLORIDE 10 MEQ/100ML IV SOLN: 10.0000 meq | INTRAVENOUS | Status: AC

## 2015-02-12 MED ORDER — THIAMINE HCL 100 MG/ML IJ SOLN
100.0000 mg | Freq: Every day | INTRAMUSCULAR | Status: DC
  Filled 2015-02-12: qty 1

## 2015-02-12 MED ORDER — ADULT MULTIVITAMIN W/MINERALS CH
1.0000 | ORAL_TABLET | Freq: Every day | ORAL | Status: DC
  Administered 2015-02-12 – 2015-02-13 (×2): 1 via ORAL
  Filled 2015-02-12 (×2): qty 1

## 2015-02-12 MED ORDER — ONDANSETRON HCL 4 MG PO TABS: 4.0000 mg | ORAL_TABLET | Freq: Four times a day (QID) | ORAL | Status: DC | PRN

## 2015-02-12 MED ORDER — LORAZEPAM 2 MG/ML IJ SOLN
0.0000 mg | Freq: Two times a day (BID) | INTRAMUSCULAR | Status: DC
Start: 2015-02-12 — End: 2015-02-13
  Administered 2015-02-12: 2 mg via INTRAVENOUS
  Filled 2015-02-12: qty 1

## 2015-02-12 MED ORDER — LORAZEPAM 1 MG PO TABS: 0.0000 mg | ORAL_TABLET | Freq: Two times a day (BID) | ORAL | Status: DC

## 2015-02-12 MED ORDER — ONDANSETRON HCL 4 MG/2ML IJ SOLN: 4.0000 mg | Freq: Four times a day (QID) | INTRAMUSCULAR | Status: DC | PRN

## 2015-02-12 MED ORDER — POTASSIUM CHLORIDE IN NACL 40-0.9 MEQ/L-% IV SOLN
INTRAVENOUS | Status: DC
  Administered 2015-02-12: 100 mL/h via INTRAVENOUS
  Filled 2015-02-12 (×5): qty 1000

## 2015-02-12 MED ORDER — LORAZEPAM 1 MG PO TABS
0.0000 mg | ORAL_TABLET | Freq: Four times a day (QID) | ORAL | Status: DC
Start: 2015-02-12 — End: 2015-02-13

## 2015-02-12 MED ORDER — KETOCONAZOLE 2 % EX CREA
TOPICAL_CREAM | Freq: Every day | CUTANEOUS | Status: DC
  Administered 2015-02-12: 22:00:00 via TOPICAL
  Administered 2015-02-13: 1 via TOPICAL
  Administered 2015-02-14 – 2015-02-17 (×4): via TOPICAL
  Filled 2015-02-12: qty 15

## 2015-02-12 MED ORDER — TAMSULOSIN HCL 0.4 MG PO CAPS
0.4000 mg | ORAL_CAPSULE | Freq: Every day | ORAL | Status: DC
  Administered 2015-02-12 – 2015-02-17 (×6): 0.4 mg via ORAL
  Filled 2015-02-12 (×6): qty 1

## 2015-02-12 MED ORDER — VITAMIN B-1 100 MG PO TABS
100.0000 mg | ORAL_TABLET | Freq: Every day | ORAL | Status: DC
  Filled 2015-02-12: qty 1

## 2015-02-12 MED ORDER — PNEUMOCOCCAL VAC POLYVALENT 25 MCG/0.5ML IJ INJ
0.5000 mL | INJECTION | INTRAMUSCULAR | Status: DC
  Filled 2015-02-12 (×2): qty 0.5

## 2015-02-12 MED ORDER — SODIUM CHLORIDE 0.9 % IJ SOLN
3.0000 mL | Freq: Two times a day (BID) | INTRAMUSCULAR | Status: DC
  Administered 2015-02-12 – 2015-02-16 (×4): 3 mL via INTRAVENOUS

## 2015-02-12 MED ORDER — VITAMIN B-1 100 MG PO TABS
100.0000 mg | ORAL_TABLET | Freq: Every day | ORAL | Status: DC
Start: 2015-02-12 — End: 2015-02-13
  Administered 2015-02-12 – 2015-02-13 (×2): 100 mg via ORAL
  Filled 2015-02-12 (×2): qty 1

## 2015-02-12 MED ORDER — ARTIFICIAL TEARS OP OINT
TOPICAL_OINTMENT | Freq: Every day | OPHTHALMIC | Status: DC | PRN
  Filled 2015-02-12: qty 3.5

## 2015-02-12 MED ORDER — THIAMINE HCL 100 MG/ML IJ SOLN
Freq: Once | INTRAVENOUS | Status: AC
  Administered 2015-02-12: 16:00:00 via INTRAVENOUS
  Filled 2015-02-12: qty 1000

## 2015-02-12 MED ORDER — LORAZEPAM 2 MG/ML IJ SOLN
1.0000 mg | Freq: Once | INTRAMUSCULAR | Status: AC
  Administered 2015-02-12: 1 mg via INTRAVENOUS
  Filled 2015-02-12: qty 1

## 2015-02-12 MED ORDER — LORAZEPAM 2 MG/ML IJ SOLN
1.0000 mg | Freq: Four times a day (QID) | INTRAMUSCULAR | Status: DC | PRN
  Administered 2015-02-12: 1 mg via INTRAVENOUS

## 2015-02-12 MED ORDER — LORAZEPAM 1 MG PO TABS: 1.0000 mg | ORAL_TABLET | Freq: Four times a day (QID) | ORAL | Status: DC | PRN

## 2015-02-12 MED ORDER — SODIUM CHLORIDE 0.9 % IV BOLUS (SEPSIS)
1000.0000 mL | Freq: Once | INTRAVENOUS | Status: AC
Start: 2015-02-12 — End: 2015-02-12
  Administered 2015-02-12: 1000 mL via INTRAVENOUS

## 2015-02-12 MED ORDER — FOLIC ACID 1 MG PO TABS
1.0000 mg | ORAL_TABLET | Freq: Every day | ORAL | Status: DC
  Administered 2015-02-12 – 2015-02-16 (×4): 1 mg via ORAL
  Filled 2015-02-12 (×5): qty 1

## 2015-02-12 NOTE — ED Notes (Signed)
MD made aware of abnormal lab

## 2015-02-12 NOTE — ED Notes (Signed)
Dr Ralene Bathe noted EKG changes.

## 2015-02-12 NOTE — ED Provider Notes (Signed)
CSN: [id number]     Arrival date & time 02/12/15  1442 History   First MD Initiated Contact with Patient 02/12/15 1529     Chief Complaint  Patient presents with  . Altered Mental Status  . Hallucinations     (Consider location/radiation/quality/duration/timing/severity/associated sxs/prior Treatment) Patient is a 59 y.o. male presenting with altered mental status.  Altered Mental Status Presenting symptoms: behavior changes and confusion   Severity:  Mild Most recent episode:  More than 2 days ago Timing:  Constant Progression:  Worsening Chronicity:  New Context: alcohol use and recent illness   Associated symptoms: no abdominal pain, no agitation, no fever, no palpitations and no weakness     Past Medical History  Diagnosis Date  . GERD (gastroesophageal reflux disease)   . 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 Transfusion history 08-16-12    '99  . Pancreatitis 08-16-12    at present weakness,fatiques easily  . Foreign body 08-16-12    nose"BB" pellet since age 53  . History of ETOH abuse 08-16-12    Heavy alcohol use daily  . Anemia   . Anxiety   . Clotting disorder ([place])   . Substance abuse   . Cirrhosis [facility])    Past Surgical History  Procedure Laterality Date  . Neck surgery      fusion of neck-titanium hardware inplaced  . Esophagogastroduodenoscopy    . Colonoscopy    . Eus N/A 08/18/2012    Procedure: UPPER ENDOSCOPIC ULTRASOUND (EUS) LINEAR;  Surgeon: Beryle Beams, MD;  Location: [facility] ENDOSCOPY;  Service: Endoscopy;  Laterality: N/A;  . Neck surgery  [id number]   Family History  Problem Relation Age of Onset  . Hypertension Father   . Cancer Mother   . Stroke Mother    Social History  Substance Use Topics  . Smoking status: Former Smoker    Types: Cigarettes    Quit date: 08/16/2005  . Smokeless tobacco: Never Used  . Alcohol Use: 50.4 oz/week    84 Cans of beer per week     Comment: 12 cans beer per day    Review of Systems   Constitutional: Negative for fever and chills.  HENT: Negative for congestion and drooling.   Eyes: Negative for photophobia and pain.  Respiratory: Negative for cough and shortness of breath.   Cardiovascular: Negative for palpitations.  Gastrointestinal: Negative for abdominal pain.  Endocrine: Negative for polyuria.  Musculoskeletal: Negative for back pain and neck pain.  Skin: Negative for wound.  Neurological: Negative for dizziness, syncope and weakness.  Psychiatric/Behavioral: Positive for confusion. Negative for agitation.  All other systems reviewed and are negative.     Allergies  Aspirin  Home Medications   Prior to Admission medications   Medication Sig Start Date End Date Taking? Authorizing Provider  acetaminophen (TYLENOL) 325 MG tablet Take 2 tablets (650 mg total) by mouth every 8 (eight) hours as needed for mild pain, moderate pain, fever or headache (or Fever >/= 101). 08/05/14  Yes Modena Jansky, MD  folic acid (FOLVITE) 1 MG tablet Take 1 tablet (1 mg total) by mouth daily. 08/05/14  Yes Modena Jansky, MD  Hypromellose (ARTIFICIAL TEARS OP) Place 2 drops into the right eye daily as needed (irritation). For redness/itchy   Yes Historical Provider, MD  LORazepam (ATIVAN) 1 MG tablet Take 0.5-1 mg by mouth every 6 (six) hours as needed for anxiety.   Yes Historical Provider, MD  naproxen sodium (ANAPROX) 220 MG  tablet Take 440 mg by mouth daily as needed (pain).   Yes Historical Provider, MD  spironolactone (ALDACTONE) 100 MG tablet Take 1 tablet (100 mg total) by mouth daily. 10/17/14  Yes Charlynne Cousins, MD  tamsulosin (FLOMAX) 0.4 MG CAPS capsule Take 0.4 mg by mouth daily.   Yes Historical Provider, MD  thiamine 100 MG tablet Take 1 tablet (100 mg total) by mouth daily. 08/05/14  Yes Modena Jansky, MD  furosemide (LASIX) 80 MG tablet Take 1 tablet (80 mg total) by mouth daily. Patient not taking: Reported on 11/29/2014 08/05/14   Modena Jansky, MD    BP 120/56 mmHg  Pulse 90  Temp(Src) 98.3 F (36.8 C) (Oral)  Resp 18  SpO2 100% Physical Exam  Constitutional: He appears well-developed and well-nourished.  HENT:  Head: Normocephalic and atraumatic.  Neck: Normal range of motion.  Cardiovascular: Normal rate and regular rhythm.   Pulmonary/Chest: Effort normal and breath sounds normal. No respiratory distress.  Abdominal: Soft. He exhibits no distension. There is no tenderness. There is no rebound.  Musculoskeletal: Normal range of motion. He exhibits no edema or tenderness.  Neurological: He is alert.  Tremulousness, not oriented to time. Oriented to self, situation, place. Moving all extremities, CN's grossly intact.    Skin: Skin is warm and dry. Rash (upper back, excoriated appearing. also with rash to right popliteal fossa appears fungal) noted.  Slight jaundice   Nursing note and vitals reviewed.   ED Course  Procedures (including critical care time)  CRITICAL CARE Performed by: Merrily Pew  Total critical care time: 35 minutes Critical care time was exclusive of separately billable procedures and treating other patients. Critical care was necessary to treat or prevent imminent or life-threatening deterioration. Critical care was time spent personally by me on the following activities: development of treatment plan with patient and/or surrogate as well as nursing, discussions with consultants, evaluation of patient's response to treatment, examination of patient, obtaining history from patient or surrogate, ordering and performing treatments and interventions, ordering and review of laboratory studies, ordering and review of radiographic studies, pulse oximetry and re-evaluation of patient's condition.   Labs Review Labs Reviewed  COMPREHENSIVE METABOLIC PANEL - Abnormal; Notable for the following:    Sodium 120 (*)    Potassium 2.5 (*)    Chloride 79 (*)    Glucose, Bld 202 (*)    BUN 29 (*)    Creatinine,  Ser 1.39 (*)    Calcium 8.8 (*)    Albumin 3.4 (*)    AST 199 (*)    Total Bilirubin 1.7 (*)    GFR calc non Af Amer 54 (*)    All other components within normal limits  CBC - Abnormal; Notable for the following:    WBC 3.6 (*)    RBC 2.75 (*)    Hemoglobin 9.2 (*)    HCT 26.3 (*)    Platelets 77 (*)    All other components within normal limits  AMMONIA - Abnormal; Notable for the following:    Ammonia 36 (*)    All other components within normal limits  ACETAMINOPHEN LEVEL - Abnormal; Notable for the following:    Acetaminophen (Tylenol), Serum <10 (*)    All other components within normal limits  COMPREHENSIVE METABOLIC PANEL - Abnormal; Notable for the following:    Sodium 129 (*)    Potassium 2.6 (*)    Chloride 94 (*)    Glucose, Bld 138 (*)  Calcium 8.4 (*)    Total Protein 6.0 (*)    Albumin 2.6 (*)    AST 144 (*)    Total Bilirubin 1.6 (*)    All other components within normal limits  CBC - Abnormal; Notable for the following:    WBC 2.6 (*)    RBC 2.47 (*)    Hemoglobin 8.4 (*)    HCT 24.0 (*)    Platelets 56 (*)    All other components within normal limits  CBC - Abnormal; Notable for the following:    WBC 2.5 (*)    RBC 2.40 (*)    Hemoglobin 8.1 (*)    HCT 24.1 (*)    MCV 100.4 (*)    Platelets 53 (*)    All other components within normal limits  COMPREHENSIVE METABOLIC PANEL - Abnormal; Notable for the following:    Sodium 132 (*)    Calcium 8.6 (*)    Total Protein 6.2 (*)    Albumin 2.7 (*)    AST 122 (*)    Total Bilirubin 1.7 (*)    All other components within normal limits  AMMONIA - Abnormal; Notable for the following:    Ammonia 51 (*)    All other components within normal limits  LACTIC ACID, PLASMA  LACTIC ACID, PLASMA  TSH  ETHANOL  MAGNESIUM  CBC  COMPREHENSIVE METABOLIC PANEL  AMMONIA    Imaging Review No results found. I have personally reviewed and evaluated these images and lab results as part of my medical  decision-making.   EKG Interpretation   Date/Time:  Wednesday February 12 2015 15:19:52 EST Ventricular Rate:  97 PR Interval:  136 QRS Duration: 163 QT Interval:  564 QTC Calculation: 717 R Axis:   78 Text Interpretation:  Sinus rhythm Right bundle branch block Confirmed by  Hazle Coca [phone]) on 02/12/2015 3:23:50 PM      MDM   Final diagnoses:  SOB (shortness of breath)  Hyponatremia Hypokalemia   ETOH w/d v hepatic encephalopathy. Will check ammonia, start CIWA. Afebrile, doubt CNS infection. No abdominal ttp or severe distension, doubt SBP for now. Will await labs and xr and d/w medicine further management.  Ammonia ok. Ethanol ok. Hyponatremia and hypokalemia present, so will admit for further workup from that point.     Merrily Pew, MD 02/14/15 [phone]

## 2015-02-12 NOTE — H&P (Signed)
[facility] History and Physical  [name] [id number] DOB: 09/09/1955 DOA: 02/12/2015  Referring physician: Dr. Merrily Pew, EDP PCP: Woody Seller, MD  Specialists:   Chief Complaint: Confusion  HPI: [name] is a 59 y.o. male  With a history of alcohol abuse, liver services, thrombocytopenia, who presented to the emergency department with complaints of confusion and behavior changes. Patient is accompanied by his wife. Patient is able to answer most questions however does deviate. States he's had "itchiness, increased fidgetiness"for the past day or so. Per his wife, he has had increased confusion and behavior changes. Patient has still been drinking alcohol however had some earlier this morning. Most times he tends to forget where he left his alcohol. He denies any recent illness, travel. Denies any chest pain, shortness of breath, changes in bowel patterns or urinary frequency.  In the emergency department, patient was noted to have a sodium of 120, ammonia level was 36, potassium 2.5. Patient was given banana bag and started on potassium supplementation along with CIWA protocol.  [facility] called for admission.   Review of Systems:  Constitutional: Denies fever, chills, diaphoresis, appetite change and fatigue.  HEENT: Denies photophobia, eye pain, redness, hearing loss, ear pain, congestion, sore throat, rhinorrhea, sneezing, mouth sores, trouble swallowing, neck pain, neck stiffness and tinnitus.   Respiratory: Denies SOB, DOE, cough, chest tightness,  and wheezing.   Cardiovascular: Denies chest pain, palpitations and leg swelling.  Gastrointestinal: Denies nausea, vomiting, abdominal pain, diarrhea, constipation, blood in stool and abdominal distention.  Genitourinary: Denies dysuria, urgency, frequency, hematuria, flank pain and difficulty urinating.  Musculoskeletal: Denies myalgias, back pain, joint swelling, arthralgias and gait problem.  Skin: Complains of  itchy dry skin Neurological: Confusion Hematological: Denies adenopathy. Easy bruising, personal or family bleeding history  Psychiatric/Behavioral: Denies suicidal ideation, mood changes, confusion, nervousness, sleep disturbance and agitation  Past Medical History  Diagnosis Date  . GERD (gastroesophageal reflux disease)   . 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 Transfusion history 08-16-12    '99  . Pancreatitis 08-16-12    at present weakness,fatiques easily  . Foreign body 08-16-12    nose"BB" pellet since age 53  . History of ETOH abuse 08-16-12    Heavy alcohol use daily  . Anemia   . Anxiety   . Clotting disorder ([place])   . Substance abuse   . Cirrhosis [facility])    Past Surgical History  Procedure Laterality Date  . Neck surgery      fusion of neck-titanium hardware inplaced  . Esophagogastroduodenoscopy    . Colonoscopy    . Eus N/A 08/18/2012    Procedure: UPPER ENDOSCOPIC ULTRASOUND (EUS) LINEAR;  Surgeon: Beryle Beams, MD;  Location: [facility] END[facility];  Service: Endoscopy;  Laterality: N/A;  . Neck surgery  [id number]   Social History:  reports that he quit smoking about 9 years ago. His smoking use included Cigarettes. He has never used smokeless tobacco. He reports that he drinks about 50.4 oz of alcohol per week. He reports that he does not use illicit drugs.   Allergies  Allergen Reactions  . Aspirin Other (See Comments)    Has upset stomach in high doses only. Took 4 aspirin with EMS, no reaction.    Family History  Problem Relation Age of Onset  . Hypertension Father   . Cancer Mother   . Stroke Mother     Prior to Admission medications   Medication Sig Start Date End Date Taking? Authorizing Provider  acetaminophen (TYLENOL) 325 MG tablet Take 2 tablets (650 mg total) by mouth every 8 (eight) hours as needed for mild pain, moderate pain, fever or headache (or Fever >/= 101). 08/05/14  Yes Modena Jansky, MD  folic acid (FOLVITE) 1 MG tablet Take 1 tablet  (1 mg total) by mouth daily. 08/05/14  Yes Modena Jansky, MD  Hypromellose (ARTIFICIAL TEARS OP) Place 2 drops into the right eye daily as needed (irritation). For redness/itchy   Yes Historical Provider, MD  LORazepam (ATIVAN) 1 MG tablet Take 0.5-1 mg by mouth every 6 (six) hours as needed for anxiety.   Yes Historical Provider, MD  naproxen sodium (ANAPROX) 220 MG tablet Take 440 mg by mouth daily as needed (pain).   Yes Historical Provider, MD  spironolactone (ALDACTONE) 100 MG tablet Take 1 tablet (100 mg total) by mouth daily. 10/17/14  Yes Charlynne Cousins, MD  tamsulosin (FLOMAX) 0.4 MG CAPS capsule Take 0.4 mg by mouth daily.   Yes Historical Provider, MD  thiamine 100 MG tablet Take 1 tablet (100 mg total) by mouth daily. 08/05/14  Yes Modena Jansky, MD  furosemide (LASIX) 80 MG tablet Take 1 tablet (80 mg total) by mouth daily. Patient not taking: Reported on 11/29/2014 08/05/14   Modena Jansky, MD   Physical Exam: Filed Vitals:   02/12/15 1605 02/12/15 1701  BP: 126/61 124/70  Pulse: 87 82  Temp:    Resp:  18     General: Well developed, well nourished, NAD, appears stated age  HEENT: NCAT, PERRLA, EOMI, Anicteic Sclera, mucous membranes dry.   Neck: Supple, no JVD, no masses  Cardiovascular: S1 S2 auscultated, no rubs, murmurs or gallops. Regular rate and rhythm.  Respiratory: Clear to auscultation bilaterally with equal chest rise  Abdomen: Soft, nontender, nondistended, + bowel sounds  Extremities: warm dry without cyanosis clubbing. Trace LE edema B/L   Neuro: AAOx3, cranial nerves grossly intact. Strength 5/5 in patient's upper and lower extremities bilaterally,+tremor/asterixis   Skin: Scaly rash noted right popliteal fossa, rash upper back, slightly jaundice  Psych: Anxious affect and demeanor   Labs on Admission:  Basic Metabolic Panel:  Recent Labs Lab 02/12/15 1555  NA 120*  K 2.5*  CL 79*  CO2 32  GLUCOSE 202*  BUN 29*  CREATININE 1.39*   CALCIUM 8.8*   Liver Function Tests:  Recent Labs Lab 02/12/15 1555  AST 199*  ALT 63  ALKPHOS 98  BILITOT 1.7*  PROT 7.6  ALBUMIN 3.4*   No results for input(s): LIPASE, AMYLASE in the last 168 hours.  Recent Labs Lab 02/12/15 1556  AMMONIA 36*   CBC:  Recent Labs Lab 02/12/15 1555  WBC 3.6*  HGB 9.2*  HCT 26.3*  MCV 95.6  PLT 77*   Cardiac Enzymes: No results for input(s): CKTOTAL, CKMB, CKMBINDEX, TROPONINI in the last 168 hours.  BNP (last 3 results)  Recent Labs  10/10/14 1702  BNP 120.9*    ProBNP (last 3 results) No results for input(s): PROBNP in the last 8760 hours.  CBG: No results for input(s): GLUCAP in the last 168 hours.  Radiological Exams on Admission: Dg Chest 2 View  02/12/2015  CLINICAL DATA:  Shortness of breath.  Evaluate for pneumonia. EXAM: CHEST  2 VIEW COMPARISON:  10/10/2014 FINDINGS: There is no focal parenchymal opacity. There is no pleural effusion or pneumothorax. The heart and mediastinal contours are unremarkable. The osseous structures are unremarkable. Prior lower anterior cervical fusion. IMPRESSION: No  active cardiopulmonary disease. Electronically Signed   By: Kathreen Devoid   On: 02/12/2015 16:06    EKG: Independently reviewed. Sinus rhythm, rate 97, right bundle branch block  Assessment/Plan  Acute metabolic encephalopathy -Patient will be admitted to telemetry -Likely secondary to multifactorial causes including hyponatremia versus alcohol -Ammonia 36 -Will conduct neuro checks q4hrs -Currently afebrile, no leukocytosis -Denies any urinary symptoms or GI changes -CXR showed no active cardiopulmonary disease  Hyponatremia -Possibly due to alcoholism -Will obtain TSH level -Will place on IVF -Continue to monitor CMP  Acute kidney injury -creatinine 1.39 upon admission -Based and creatinine less than 1 -Possibly secondary to diuretics including Lasix as well as prolactin however patient does not take any  medications in several days per wife -If no improvement in kidney function after IVF, will obtain renal US  Elevated transaminases -Secondary to alcohol abuse -Continue to monitor CMP -Placed on IVF  Alcohol Abuse with hallucinations -Will place on CIWA protocol and banana bag  Hypokalemia -Possibly secondary to diuretic use -Will replace and continue to monitor CMP  Thrombocytopenia -Platelets actually improving as compared to August, currently 19 -Likely secondary to liver disease -Continue to monitor CBC  Liver cirrhosis  -Appears to be stable at this time -Secondary to alcohol abuse  ?Fungal infection -Will order ketoconazole cream  Chronic anemia -Baseline hemoglobin between 8 and 9, currently 9.2 -Continue to monitor CBC  DVT prophylaxis: SCDs  Code Status: Full  Condition: Guarded  Family Communication: Wife at bedside. Admission, patients condition and plan of care including tests being ordered have been discussed with the patient and wife who indicate understanding and agree with the plan and Code Status.  Disposition Plan: Admitted  Time spent: 70 minutes  Phillp Dolores D.O. [facility] Pager [phone]  If 7PM-7AM, please contact night-coverage [email or web address] Password [facility] 02/12/2015, 5:34 PM

## 2015-02-12 NOTE — ED Notes (Signed)
Bed: WA17 Expected date:  Expected time:  Means of arrival:  Comments: Deep clean then Res A

## 2015-02-12 NOTE — ED Notes (Signed)
Pt c/o mid back pain x "a couple weeks" and increasing confusion and visual hallucinations x 3 days.  Pain score 8/10.  Pt reports taking Aleve w/o relief "for about 20 minutes."  Denies medication changes and drug use.  Pt sts that hallucinations are people that he does not recognize.  Hx of cirrhosis.  Pt's wife reports "they did blood work on his last week and his liver is getting worse."

## 2015-02-13 DIAGNOSIS — D61818 Other pancytopenia: Secondary | ICD-10-CM

## 2015-02-13 LAB — COMPREHENSIVE METABOLIC PANEL
ALBUMIN: 2.6 g/dL — AB (ref 3.5–5.0)
ALT: 47 U/L (ref 17–63)
ANION GAP: 9 (ref 5–15)
AST: 144 U/L — ABNORMAL HIGH (ref 15–41)
Alkaline Phosphatase: 73 U/L (ref 38–126)
BUN: 20 mg/dL (ref 6–20)
CO2: 26 mmol/L (ref 22–32)
Calcium: 8.4 mg/dL — ABNORMAL LOW (ref 8.9–10.3)
Chloride: 94 mmol/L — ABNORMAL LOW (ref 101–111)
Creatinine, Ser: 0.89 mg/dL (ref 0.61–1.24)
GFR calc non Af Amer: >60 mL/min (ref >60)
Glucose, Bld: 138 mg/dL — ABNORMAL HIGH (ref 65–99)
POTASSIUM: 2.6 mmol/L — AB (ref 3.5–5.1)
SODIUM: 129 mmol/L — AB (ref 135–145)
TOTAL PROTEIN: 6 g/dL — AB (ref 6.5–8.1)
Total Bilirubin: 1.6 mg/dL — ABNORMAL HIGH (ref 0.3–1.2)

## 2015-02-13 LAB — CBC
HEMATOCRIT: 24 % — AB (ref 39.0–52.0)
HEMOGLOBIN: 8.4 g/dL — AB (ref 13.0–17.0)
MCH: 34 pg (ref 26.0–34.0)
MCHC: 35 g/dL (ref 30.0–36.0)
MCV: 97.2 fL (ref 78.0–100.0)
Platelets: 56 10*3/uL — ABNORMAL LOW (ref 150–400)
RBC: 2.47 MIL/uL — AB (ref 4.22–5.81)
RDW: 13.3 % (ref 11.5–15.5)
WBC: 2.6 10*3/uL — ABNORMAL LOW (ref 4.0–10.5)

## 2015-02-13 MED ORDER — POTASSIUM CHLORIDE CRYS ER 20 MEQ PO TBCR
40.0000 meq | EXTENDED_RELEASE_TABLET | Freq: Once | ORAL | Status: AC
  Administered 2015-02-13: 40 meq via ORAL
  Filled 2015-02-13: qty 2

## 2015-02-13 MED ORDER — ADULT MULTIVITAMIN W/MINERALS CH
1.0000 | ORAL_TABLET | Freq: Every day | ORAL | Status: DC
  Administered 2015-02-14 – 2015-02-17 (×4): 1 via ORAL
  Filled 2015-02-13 (×5): qty 1

## 2015-02-13 MED ORDER — LORAZEPAM 2 MG/ML IJ SOLN
1.0000 mg | Freq: Four times a day (QID) | INTRAMUSCULAR | Status: DC | PRN
  Administered 2015-02-13 – 2015-02-16 (×7): 1 mg via INTRAVENOUS
  Filled 2015-02-13 (×7): qty 1

## 2015-02-13 MED ORDER — THIAMINE HCL 100 MG/ML IJ SOLN
100.0000 mg | Freq: Every day | INTRAMUSCULAR | Status: DC
  Filled 2015-02-13 (×3): qty 1

## 2015-02-13 MED ORDER — VITAMIN B-1 100 MG PO TABS
100.0000 mg | ORAL_TABLET | Freq: Every day | ORAL | Status: DC
  Administered 2015-02-14 – 2015-02-16 (×3): 100 mg via ORAL
  Filled 2015-02-13 (×4): qty 1

## 2015-02-13 MED ORDER — FOLIC ACID 1 MG PO TABS
1.0000 mg | ORAL_TABLET | Freq: Every day | ORAL | Status: DC
Start: 2015-02-13 — End: 2015-02-16
  Administered 2015-02-14 – 2015-02-16 (×3): 1 mg via ORAL
  Filled 2015-02-13 (×4): qty 1

## 2015-02-13 MED ORDER — LORAZEPAM 1 MG PO TABS
1.0000 mg | ORAL_TABLET | Freq: Four times a day (QID) | ORAL | Status: DC | PRN
  Administered 2015-02-14 – 2015-02-15 (×2): 1 mg via ORAL
  Filled 2015-02-13 (×4): qty 1

## 2015-02-13 MED ORDER — MORPHINE SULFATE (PF) 2 MG/ML IV SOLN
2.0000 mg | Freq: Four times a day (QID) | INTRAVENOUS | Status: DC | PRN
  Administered 2015-02-13 – 2015-02-14 (×5): 2 mg via INTRAVENOUS
  Filled 2015-02-13 (×5): qty 1

## 2015-02-13 NOTE — Progress Notes (Signed)
Occupational Therapy Evaluation Patient Details Name: [name] MRN: [id number] DOB: 1955-03-22 Today's Date: 02/13/2015    History of Present Illness [name] is a 59 y.o. male With a history of alcohol abuse, liver services, thrombocytopenia, who presented to the emergency department with complaints of confusion and behavior changes.   Clinical Impression   Patient presents to OT with decreased ADL independence and safety due to the deficits listed below. Limited evaluation as patient has K+ 2.6, but patient did sit EOB for simple ADLs and stand x 1 with sidesteps toward [facility] with min A for mobility and up to mod A for ADLs. Patient will benefit from skilled OT to maximize independence and to facilitate a safe discharge plan.    Follow Up Recommendations  Supervision/Assistance - 24 hour;Home health OT (if he has 24/7 assistance at home; if not, may need to consider SNF)    Equipment Recommendations  3 in 1 bedside comode;Tub/shower bench    Recommendations for Other Services PT consult     Precautions / Restrictions Precautions Precautions: Fall Restrictions Weight Bearing Restrictions: No      Mobility Bed Mobility Overal bed mobility: Needs Assistance Bed Mobility: Supine to Sit;Sit to Supine     Supine to sit: Supervision Sit to supine: Supervision      Transfers Overall transfer level: Needs assistance Equipment used: None Transfers: Sit to/from Stand Sit to Stand: Min assist         General transfer comment: took sidesteps toward [facility].    Balance                                            ADL Overall ADL's : Needs assistance/impaired Eating/Feeding: Set up;Bed level Eating/Feeding Details (indicate cue type and reason): BUE tremors -- found utensils in bed with patient and patient unable to locate them Grooming: Wash/dry hands;Wash/dry face;Applying deodorant;Minimal assistance;Sitting   Upper Body Bathing: Minimal  assitance;Sitting   Lower Body Bathing: Moderate assistance;Sit to/from stand                       Functional mobility during ADLs: Minimal assistance General ADL Comments: Patient received finishing breakfast; attempting to locate fork. OT found fork in his bed along with his knife. Patient oriented to self, "hospital" but not [name], reports date as October 22. 1979. Able to carry on conversation but tangential and forgetful. Reports he has had 2 recent falls at home in the last 2 weeks. He has BUE tremors which impede ADL performance. Urinal use min A overall with minimal result. Patient reported he believes he fell because he was "drinking." Son had to assist him off of the bathroom floor. Patient reports his son is there 24/7. Unclear if the equipment he reported he has is accurate due to cognitive status. May benefit from bathroom DME at discharge.     Vision     Perception     Praxis      Pertinent Vitals/Pain Pain Assessment: 0-10 Pain Score: 7  Pain Location: back Pain Descriptors / Indicators: Sore;Aching Pain Intervention(s): Limited activity within patient's tolerance;Monitored during session     Hand Dominance Right   Extremity/Trunk Assessment Upper Extremity Assessment Upper Extremity Assessment: Generalized weakness   Lower Extremity Assessment Lower Extremity Assessment: Defer to PT evaluation       Communication Communication Communication: No difficulties  Cognition Arousal/Alertness: Awake/alert Behavior During Therapy: WFL for tasks assessed/performed Overall Cognitive Status: No family/caregiver present to determine baseline cognitive functioning       Memory: Decreased short-term memory             General Comments       Exercises       Shoulder Instructions      Home Living Family/patient expects to be discharged to:: Private residence Living Arrangements: Spouse/significant other Available Help at Discharge:  Family;Available 24 hours/day (wife works; son is there 24/7) Type of Home: House Home Access: Stairs to enter Technical brewer of Steps: 5 Entrance Stairs-Rails: Right;Left;Can reach both [place]: One level     Bathroom Shower/Tub: Occupational psychologist: Handicapped height     Home Equipment: Environmental consultant - 2 wheels;Cane - single point;Bedside commode          Prior Functioning/Environment Level of Independence: Independent             OT Diagnosis: Generalized weakness   OT Problem List: Decreased strength;Decreased activity tolerance;Impaired balance (sitting and/or standing);Decreased safety awareness;Decreased cognition;Decreased knowledge of use of DME or AE;Decreased knowledge of precautions;Pain;Impaired UE functional use   OT Treatment/Interventions: Self-care/ADL training;DME and/or AE instruction;Energy conservation;Therapeutic activities;Patient/family education    OT Goals(Current goals can be found in the care plan section) Acute Rehab OT Goals Patient Stated Goal: to go home OT Goal Formulation: With patient Time For Goal Achievement: 02/27/15 Potential to Achieve Goals: Good ADL Goals Pt Will Perform Grooming: with supervision;standing Pt Will Perform Upper Body Bathing: with supervision;sitting Pt Will Perform Lower Body Bathing: with min assist;sit to/from stand Pt Will Perform Upper Body Dressing: with supervision;sitting Pt Will Perform Lower Body Dressing: with min assist;sit to/from stand Pt Will Transfer to Toilet: with supervision;ambulating;bedside commode Pt Will Perform Toileting - Clothing Manipulation and hygiene: with supervision;sit to/from stand  OT Frequency: Min 2X/week   Barriers to D/C:            Co-evaluation              End of Session Nurse Communication: Other (comment) (nurse heard patient report pain number to OT)  Activity Tolerance: Patient tolerated treatment well Patient left: in bed;with call  bell/phone within reach;with bed alarm set   Time: [id number] OT Time Calculation (min): 32 min Charges:  OT General Charges $OT Visit: 1 Procedure OT Evaluation $Initial OT Evaluation Tier I: 1 Procedure OT Treatments $Self Care/Home Management : 8-22 mins G-Codes:    Ceniyah Thorp A Mar 04, 2015, 9:37 AM

## 2015-02-13 NOTE — Progress Notes (Signed)
PT Cancellation Note  Patient Details Name: [name] MRN: [id number] DOB: [birth year]   Cancelled Treatment:    Reason Eval/Treat Not Completed: Patient not medically ready (K+ 2.6, has not been replaced at this time, will attempt again as schedule permits)   [facility] 02/13/2015, 9:07 AM

## 2015-02-13 NOTE — Progress Notes (Signed)
Initial Nutrition Assessment  DOCUMENTATION CODES:   Not applicable  INTERVENTION:  - Monitor magnesium, potassium, and phosphorus daily for at least 3 days, MD to replete as needed, as pt is at risk for refeeding syndrome. - RD will continue to monitor for needs  NUTRITION DIAGNOSIS:   Altered nutrition lab value related to social / environmental circumstances as evidenced by other (see comment) (K: 2.6 mmol/L).  GOAL:   Patient will meet greater than or equal to 90% of their needs  MONITOR:   PO intake, Weight trends, Labs, Skin, I & O's  REASON FOR ASSESSMENT:   Consult  (Alcoholic)  ASSESSMENT:   59 year-old male with history of alcohol abuse, liver services, thrombocytopenia, who presented to the emergency department with complaints of confusion and behavior changes. Patient is accompanied by his wife. Patient is able to answer most questions however does deviate. States he's had "itchiness, increased fidgetiness"for the past day or so. Per his wife, he has had increased confusion and behavior changes. Patient has still been drinking alcohol however had some earlier this morning. Most times he tends to forget where he left his alcohol. He denies any recent illness, travel. Denies any chest pain, shortness of breath, changes in bowel patterns or urinary frequency. In the emergency department, patient was noted to have a sodium of 120, ammonia level was 36, potassium 2.5. Patient was given banana bag and started on potassium supplementation along with CIWA protocol.  Pt seen for consult. BMI indicates overweight status. Pt with confusion and drowsiness and unable to provide information. RN reports pt consumed ~80% of breakfast this AM and she recently ordered him lunch, which has just arrived.   No muscle or fat wasting present during assessment. Per chart review, pt gained 14 lbs from 10/17/14-12/05/14 and subsequently lost 17 lbs (9% body weight) from 12/05/14-02/13/15 which is  significant for time frame but current weight is stable from weight in 07/2014; will continue to monitor weight trends.   Pt at high risk for refeeding. K already low with repletion ordered. Unsure of eating habits and patterns PTA. Medications reviewed; NS with 40 mEq/L KCl @ 100 mL/hr. Labs reviewed; Na: 129 mmol/L, Cl: 94 mmol/L, Ca: 8.4 mg/dL, K: 2.6 mmol/L, with no Mg or Phos labs at this time.    Diet Order:  Diet Heart Room service appropriate?: Yes; Fluid consistency:: Thin  Skin:  Reviewed, no issues  Last BM:  12/1  Height:   Ht Readings from Last 1 Encounters:  02/13/15 5\' 7"  (1.702 m)    Weight:   Wt Readings from Last 1 Encounters:  02/13/15 170 lb (77.111 kg)    Ideal Body Weight:  67.27 kg (kg)  BMI:  Body mass index is 26.62 kg/(m^2).  Estimated Nutritional Needs:   Kcal:  1500-1700  Protein:  65-75 grams  Fluid:  1.5-1.8 L/day  EDUCATION NEEDS:   No education needs identified at this time      [name], RD, LDN Inpatient Clinical Dietitian Pager # [phone] After hours/weekend pager # [phone]

## 2015-02-13 NOTE — Care Management Note (Signed)
Case Management Note  Patient Details  Name: Johnluke Lindy MRN: [id number] Date of Birth: 1956/01/26  Subjective/Objective:          etoh withdrawal with confusion ciwa protocol          Action/Plan:Date: February 13, 2015 Chart reviewed for concurrent status and case management needs. Will continue to follow patient for changes and needs: Velva Harman, RN, BSN, [place]   [phone]   Expected Discharge Date:   (unknown)               Expected Discharge Plan:  Home/Self Care  In-House Referral:  Clinical Social Work  Discharge planning Services  CM Consult  Post Acute Care Choice:  NA Choice offered to:  NA  DME Arranged:    DME Agency:     HH Arranged:    [place] Agency:     Status of Service:  In process, will continue to follow  Medicare Important Message Given:    Date Medicare IM Given:    Medicare IM give by:    Date Additional Medicare IM Given:    Additional Medicare Important Message give by:     If discussed at [place] of Stay Meetings, dates discussed:    Additional Comments:  Leeroy Cha, RN 02/13/2015, 10:55 AM

## 2015-02-13 NOTE — Progress Notes (Addendum)
[facility]                                                                              Patient Demographics  [name], is a 59 y.o. male, DOB - 22-Nov-1955, [id number]  Admit date - 02/12/2015   Admitting Physician Cristal Ford, DO  Outpatient Primary MD for the patient is Woody Seller, MD  LOS - 1   Chief Complaint  Patient presents with  . Altered Mental Status  . Hallucinations      HPI on 02/12/2015  [name] is a 59 y.o. male with a history of alcohol abuse, liver services, thrombocytopenia, who presented to the emergency department with complaints of confusion and behavior changes. Patient is accompanied by his wife. Patient is able to answer most questions however does deviate. States he's had "itchiness, increased fidgetiness"for the past day or so. Per his wife, he has had increased confusion and behavior changes. Patient has still been drinking alcohol however had some earlier this morning. Most times he tends to forget where he left his alcohol. He denies any recent illness, travel. Denies any chest pain, shortness of breath, changes in bowel patterns or urinary frequency. In the emergency department, patient was noted to have a sodium of 120, ammonia level was 36, potassium 2.5. Patient was given banana bag and started on potassium supplementation along with CIWA protocol. TR[facility] called for admission.   Assessment & Plan  Acute metabolic encephalopathy -Improving -Likely secondary to multifactorial causes including hyponatremia versus alcohol -Ammonia 36 -Currently afebrile, no leukocytosis -Denies any urinary symptoms or GI changes -CXR showed no active cardiopulmonary disease -PT and OT consulted for eval and treatment  Hyponatremia -Improving, Na 129 -Possibly due to alcoholism -TSH 1.055 -Continue IVF and monitor CMP  Acute kidney injury -creatinine 1.39 upon admission, currently 0.89 -Possibly secondary to diuretics including  Lasix as well as prolactin however patient does not take any medications in several days per wife  Elevated transaminases -Improving, Secondary to alcohol abuse -Continue to monitor CMP and IVF  Alcohol Abuse with hallucinations -Continue CIWA protocol -Continue multivitamin, folate, thiamine  Hypokalemia -Possibly secondary to diuretic use -Will replace and continue to monitor CMP  Pancytopenia -Hb 8.4, plateles 56 (drop likely dilutional) -Likely secondary to liver disease -Continue to monitor CBC  Liver cirrhosis  -Appears to be stable at this time -Secondary to alcohol abuse  ?Fungal infection -Continue ketoconazole cream  Code Status: Full  Family Communication: None at bedside  Disposition Plan: Admitted.  Pending improvement in mental status and labs  Time Spent in minutes   30 minutes  Procedures  None  Consults   None  DVT Prophylaxis  SCDs  Lab Results  Component Value Date   PLT 56* 02/13/2015    Medications  Scheduled Meds: . folic acid  1 mg Oral Daily  . folic acid  1 mg Oral Daily  . ketoconazole   Topical Daily  . multivitamin with minerals  1 tablet Oral Daily  . pneumococcal 23 valent vaccine  0.5 mL Intramuscular Tomorrow-1000  . sodium chloride  3 mL Intravenous Q12H  . tamsulosin  0.4 mg Oral Daily  . thiamine  100 mg Oral  Daily   Or  . thiamine  100 mg Intravenous Daily   Continuous Infusions: . 0.9 % NaCl with KCl 40 mEq / L 100 mL/hr (02/12/15 2134)   PRN Meds:.artificial tears, LORazepam **OR** LORazepam, morphine injection, ondansetron **OR** ondansetron (ZOFRAN) IV  Antibiotics    Anti-infectives    None      Subjective:   [name] seen and examined today.  Patient has no complaints today.  Feels he is fine.  Denies chest pain, shortness of breath, abdominal pain, nausea, vomiting, diarrhea, constipation, dizziness, headache.    Objective:   Filed Vitals:   02/12/15 1940 02/12/15 2204 02/13/15 0506  02/13/15 0800  BP: 118/62 124/71 122/56   Pulse: 89 83 95   Temp: 97.9 F (36.6 C) 97.6 F (36.4 C) 97.9 F (36.6 C)   TempSrc: Oral Oral Oral   Resp: 18 18 18    Height:    5\' 7"  (1.702 m)  Weight:    77.111 kg (170 lb)  SpO2: 100% 98% 92%     Wt Readings from Last 3 Encounters:  02/13/15 77.111 kg (170 lb)  12/05/14 85.095 kg (187 lb 9.6 oz)  10/17/14 78.6 kg (173 lb 4.5 oz)     Intake/Output Summary (Last 24 hours) at 02/13/15 1105 Last data filed at 02/13/15 0732  Gross per 24 hour  Intake      0 ml  Output    725 ml  Net   -725 ml    Exam  General: Well developed, well nourished, NAD  HEENT: NCAT, mucous membranes moist.   Cardiovascular: S1 S2 auscultated, no rubs, murmurs or gallops. Regular rate and rhythm.  Respiratory: Clear to auscultation bilaterally   Abdomen: Soft, nontender, nondistended, + bowel sounds  Extremities: warm dry without cyanosis clubbing. Trace LE edema  Neuro: AAOx3, nonfocal, slight tremor/asterixis improving  Skin: Right popliteal fossa rash-improving  Psych: Normal affect and demeanor  Data Review   Micro Results No results found for this or any previous visit (from the past 240 hour(s)).  Radiology Reports Dg Chest 2 View  02/12/2015  CLINICAL DATA:  Shortness of breath.  Evaluate for pneumonia. EXAM: CHEST  2 VIEW COMPARISON:  10/10/2014 FINDINGS: There is no focal parenchymal opacity. There is no pleural effusion or pneumothorax. The heart and mediastinal contours are unremarkable. The osseous structures are unremarkable. Prior lower anterior cervical fusion. IMPRESSION: No active cardiopulmonary disease. Electronically Signed   By: Kathreen Devoid   On: 02/12/2015 16:06    CBC  Recent Labs Lab 02/12/15 1555 02/13/15 0555  WBC 3.6* 2.6*  HGB 9.2* 8.4*  HCT 26.3* 24.0*  PLT 77* 56*  MCV 95.6 97.2  MCH 33.5 34.0  MCHC 35.0 35.0  RDW 13.2 13.3    Chemistries   Recent Labs Lab 02/12/15 1555 02/12/15 2145  02/13/15 0555  NA 120*  --  129*  K 2.5*  --  2.6*  CL 79*  --  94*  CO2 32  --  26  GLUCOSE 202*  --  138*  BUN 29*  --  20  CREATININE 1.39*  --  0.89  CALCIUM 8.8*  --  8.4*  MG  --  1.8  --   AST 199*  --  144*  ALT 63  --  47  ALKPHOS 98  --  73  BILITOT 1.7*  --  1.6*   ------------------------------------------------------------------------------------------------------------------ estimated creatinine clearance is 82.5 mL/min (by C-G formula based on Cr of 0.89). ------------------------------------------------------------------------------------------------------------------ No results  for input(s): HGBA1C in the last 72 hours. ------------------------------------------------------------------------------------------------------------------ No results for input(s): CHOL, HDL, LDLCALC, TRIG, CHOLHDL, LDLDIRECT in the last 72 hours. ------------------------------------------------------------------------------------------------------------------  Recent Labs  02/12/15 2030  TSH 1.055   ------------------------------------------------------------------------------------------------------------------ No results for input(s): VITAMINB12, FOLATE, FERRITIN, TIBC, IRON, RETICCTPCT in the last 72 hours.  Coagulation profile No results for input(s): INR, PROTIME in the last 168 hours.  No results for input(s): DDIMER in the last 72 hours.  Cardiac Enzymes No results for input(s): CKMB, TROPONINI, MYOGLOBIN in the last 168 hours.  Invalid input(s): CK ------------------------------------------------------------------------------------------------------------------ Invalid input(s): POCBNP  Jared Sainsbury D.O. on 02/13/2015 at 11:05 AM  Between 7am to 7pm - Pager - [phone]  After 7pm go to [email or web address] - password [email or web address]  And look for the night coverage person covering for me after hours  [facility] Office  [phone]

## 2015-02-14 ENCOUNTER — Inpatient Hospital Stay (HOSPITAL_COMMUNITY): Payer: 59

## 2015-02-14 LAB — COMPREHENSIVE METABOLIC PANEL
ALBUMIN: 2.7 g/dL — AB (ref 3.5–5.0)
ALT: 43 U/L (ref 17–63)
AST: 122 U/L — AB (ref 15–41)
Alkaline Phosphatase: 73 U/L (ref 38–126)
Anion gap: 6 (ref 5–15)
BUN: 15 mg/dL (ref 6–20)
CHLORIDE: 102 mmol/L (ref 101–111)
CO2: 24 mmol/L (ref 22–32)
CREATININE: 0.82 mg/dL (ref 0.61–1.24)
Calcium: 8.6 mg/dL — ABNORMAL LOW (ref 8.9–10.3)
GFR calc non Af Amer: >60 mL/min (ref >60)
Glucose, Bld: 88 mg/dL (ref 65–99)
POTASSIUM: 3.8 mmol/L (ref 3.5–5.1)
SODIUM: 132 mmol/L — AB (ref 135–145)
Total Bilirubin: 1.7 mg/dL — ABNORMAL HIGH (ref 0.3–1.2)
Total Protein: 6.2 g/dL — ABNORMAL LOW (ref 6.5–8.1)

## 2015-02-14 LAB — CBC
HEMATOCRIT: 24.1 % — AB (ref 39.0–52.0)
HEMOGLOBIN: 8.1 g/dL — AB (ref 13.0–17.0)
MCH: 33.8 pg (ref 26.0–34.0)
MCHC: 33.6 g/dL (ref 30.0–36.0)
MCV: 100.4 fL — ABNORMAL HIGH (ref 78.0–100.0)
Platelets: 53 10*3/uL — ABNORMAL LOW (ref 150–400)
RBC: 2.4 MIL/uL — AB (ref 4.22–5.81)
RDW: 13.7 % (ref 11.5–15.5)
WBC: 2.5 10*3/uL — AB (ref 4.0–10.5)

## 2015-02-14 LAB — AMMONIA: Ammonia: 51 umol/L — ABNORMAL HIGH (ref 9–35)

## 2015-02-14 MED ORDER — FUROSEMIDE 10 MG/ML IJ SOLN
INTRAMUSCULAR | Status: AC
  Filled 2015-02-14: qty 2

## 2015-02-14 MED ORDER — SODIUM CHLORIDE 0.9 % IV SOLN
INTRAVENOUS | Status: DC
  Administered 2015-02-14 – 2015-02-16 (×4): via INTRAVENOUS

## 2015-02-14 MED ORDER — FUROSEMIDE 10 MG/ML IJ SOLN
20.0000 mg | Freq: Once | INTRAMUSCULAR | Status: AC
  Administered 2015-02-14: 20 mg via INTRAVENOUS

## 2015-02-14 MED ORDER — LACTULOSE 10 GM/15ML PO SOLN
20.0000 g | Freq: Once | ORAL | Status: AC
  Administered 2015-02-14: 20 g via ORAL
  Filled 2015-02-14: qty 30

## 2015-02-14 NOTE — Evaluation (Signed)
Physical Therapy Evaluation Patient Details Name: [name] MRN: [id number] DOB: 1955/09/14 Today's Date: 02/14/2015   History of Present Illness  [name] is a 59 y.o. male With a history of alcohol abuse, thrombocytopenia, who presented to the emergency department with complaints of confusion and behavior changes. Dx of encephalopathy, hyponatremia, AKI.   Clinical Impression  Pt admitted with above diagnosis. Pt currently with functional limitations due to the deficits listed below (see PT Problem List). Pt ambulated 71' with RW and min/guard assist. He is fidgety and not oriented to place nor to year.  Pt will benefit from skilled PT to increase their independence and safety with mobility to allow discharge to the venue listed below.       Follow Up Recommendations Home health PT; 24* supervision for safety    Equipment Recommendations  Rolling walker with 5" wheels (RW if he doesn't have one (pt stated he has one but is not oriented, so need to confirm))    Recommendations for Other Services OT consult     Precautions / Restrictions Precautions Precautions: Fall Precaution Comments: pt reports 2-3 falls in past year, however is not oriented to year or location so unreliable historian Restrictions Weight Bearing Restrictions: No      Mobility  Bed Mobility Overal bed mobility: Needs Assistance Bed Mobility: Supine to Sit     Supine to sit: HOB elevated;Supervision Sit to supine: Supervision   General bed mobility comments: for safety  Transfers Overall transfer level: Needs assistance Equipment used: Rolling walker (2 wheeled) Transfers: Sit to/from Stand Sit to Stand: Min guard         General transfer comment: verbal cues for hand placement, uncontrolled descent to recliner  Ambulation/Gait Ambulation/Gait assistance: Min guard Ambulation Distance (Feet): 80 Feet Assistive device: Rolling walker (2 wheeled) Gait Pattern/deviations: Step-through  pattern;Decreased step length - right;Decreased step length - left   Gait velocity interpretation: Below normal speed for age/gender General Gait Details: steady with RW, no LOB, min/guard for safety  Stairs            Wheelchair Mobility    Modified Rankin (Stroke Patients Only)       Balance Overall balance assessment: Needs assistance   Sitting balance-Leahy Scale: Good       Standing balance-Leahy Scale: Fair                               Pertinent Vitals/Pain Pain Assessment: Faces Pain Score: 9  Faces Pain Scale: No hurt Pain Location: chest Pain Intervention(s): Other (comment) (RN notified, she assessed pt and stated pain is likely musculoskeletal, pt has no SOB nor arm pain)    Home Living Family/patient expects to be discharged to:: Private residence Living Arrangements: Spouse/significant other Available Help at Discharge: Family;Available 24 hours/day (wife works; son is there 24/7) Type of Home: House Home Access: Stairs to enter Entrance Stairs-Rails: Right;Left;Can reach both Technical brewer of Steps: [place]: One level Home Equipment: [place] - 2 wheels;Cane - single point;Bedside commode;Shower seat Additional Comments: pt stated grab bars are being installed into his bathroom    Prior Function Level of Independence: Independent               Hand Dominance   Dominant Hand: Right    Extremity/Trunk Assessment   Upper Extremity Assessment: Generalized weakness           Lower Extremity Assessment: Overall WFL for tasks assessed  Cervical / Trunk Assessment: Normal  Communication   Communication: No difficulties  Cognition Arousal/Alertness: Awake/alert Behavior During Therapy: Restless Overall Cognitive Status: No family/caregiver present to determine baseline cognitive functioning       Memory: Decreased short-term memory (pt stated he's in a hospital in [place], year is 44)               General Comments      Exercises        Assessment/Plan    PT Assessment Patient needs continued PT services  PT Diagnosis Generalized weakness;Altered mental status   PT Problem List Decreased activity tolerance;Decreased balance;Decreased knowledge of use of DME;Decreased mobility;Pain  PT Treatment Interventions DME instruction;Gait training;Functional mobility training;Therapeutic activities;Patient/family education;Therapeutic exercise   PT Goals (Current goals can be found in the Care Plan section) Acute Rehab PT Goals Patient Stated Goal: to go fishing PT Goal Formulation: With patient Time For Goal Achievement: 02/28/15 Potential to Achieve Goals: Good    Frequency Min 3X/week   Barriers to discharge        Co-evaluation               End of Session Equipment Utilized During Treatment: Gait belt Activity Tolerance: Patient tolerated treatment well Patient left: in chair;with call bell/phone within reach;with chair alarm set Nurse Communication: Mobility status         Time: [id number] PT Time Calculation (min) (ACUTE ONLY): 25 min   Charges:   PT Evaluation $Initial PT Evaluation Tier I: 1 Procedure PT Treatments $Gait Training: 8-22 mins   PT G Codes:        Philomena Doheny 02/14/2015, 11:06 AM [phone]

## 2015-02-14 NOTE — Progress Notes (Signed)
Occupational Therapy Treatment Patient Details Name: [name] MRN: [id number] DOB: [birth year] Today's Date: 02/14/2015    History of present illness [name] is a 59 y.o. male With a history of alcohol abuse, liver services, thrombocytopenia, who presented to the emergency department with complaints of confusion and behavior changes.   OT comments  Patient with increased confusion, restlessness, fidgeting today and noted SOB during ADLs. Likely will need 24/7 S/A at discharge.  Follow Up Recommendations  SNF;Supervision/Assistance - 24 hour    Equipment Recommendations  3 in 1 bedside comode;Tub/shower bench    Recommendations for Other Services PT consult    Precautions / Restrictions Precautions Precautions: Fall Restrictions Weight Bearing Restrictions: No       Mobility Bed Mobility Overal bed mobility: Needs Assistance Bed Mobility: Supine to Sit;Sit to Supine     Supine to sit: Supervision Sit to supine: Supervision      Transfers Overall transfer level: Needs assistance Equipment used: None Transfers: Sit to/from Stand Sit to Stand: Min assist         General transfer comment: took sidesteps toward [name].    Balance                                   ADL Overall ADL's : Needs assistance/impaired     Grooming: Wash/dry hands;Wash/dry face;Minimal assistance;Sitting       Lower Body Bathing: Minimal assistance;Sit to/from stand Lower Body Bathing Details (indicate cue type and reason): washed feet and applied lotion     Lower Body Dressing: Minimal assistance Lower Body Dressing Details (indicate cue type and reason): don/doff socks             Functional mobility during ADLs: Minimal assistance General ADL Comments: Patient more anxious/restless in the bed today. Fidgeting and picking at gown and bed sheet. Noted increased confusion today during conversation and decreased short-term memory. Patient oriented to self,  thinks it is "March [birth year], 1997" and reports he is in a "hospital in [place]." Patient reports he was hungry. Assisted patient with ordering breakfast by dialing the telephone but patient placed order. Less than 2 minutes later, he could not recall what he had ordered. He was agreeable to simple ADLs. Provided washcloth and patient washed peri area in standing with min A; washed legs and feet from seated position as well. Applied lotion to B feet. Patient required rest breaks due to SOB. Doff socks independently; don socks with min A to turn socks correctly once on foot. Patient also required a couple of rest breaks due to SOB. Patient used urinal sitting EOB with setup/S. Sit to stand x 1 min A and sidesteps to HOB min guard A. Patient returned to bed at end of session and bed alarm replaced.      Vision                     Perception     Praxis      Cognition   Behavior During Therapy: Anxious;Restless Overall Cognitive Status: No family/caregiver present to determine baseline cognitive functioning       Memory: Decreased short-term memory               Extremity/Trunk Assessment               Exercises     Shoulder Instructions       General Comments  Pertinent Vitals/ Pain       Pain Assessment: Faces Pain Score: 0-No pain Faces Pain Scale: No hurt  Home Living                                          Prior Functioning/Environment              Frequency Min 2X/week     Progress Toward Goals  OT Goals(current goals can now be found in the care plan section)  Progress towards OT goals: Not progressing toward goals - comment (pt with increased confusion, restlessness, anxiety today)     Plan Discharge plan needs to be updated    Co-evaluation                 End of Session     Activity Tolerance Patient tolerated treatment well   Patient Left in bed;with call bell/phone within reach;with bed alarm  set   Nurse Communication          Time: [phone] OT Time Calculation (min): 25 min  Charges: OT General Charges $OT Visit: 1 Procedure OT Treatments $Self Care/Home Management : 23-37 mins  Secilia Apps A 02/14/2015, 9:59 AM

## 2015-02-14 NOTE — Progress Notes (Signed)
[facility]                                                                              Patient Demographics  [name], is a 59 y.o. male, DOB - January 31, 1956, [id number]  Admit date - 02/12/2015   Admitting Physician Cristal Ford, DO  Outpatient Primary MD for the patient is Woody Seller, MD  LOS - 2   Chief Complaint  Patient presents with  . Altered Mental Status  . Hallucinations      HPI on 02/12/2015  [name] is a 59 y.o. male with a history of alcohol abuse, liver services, thrombocytopenia, who presented to the emergency department with complaints of confusion and behavior changes. Patient is accompanied by his wife. Patient is able to answer most questions however does deviate. States he's had "itchiness, increased fidgetiness"for the past day or so. Per his wife, he has had increased confusion and behavior changes. Patient has still been drinking alcohol however had some earlier this morning. Most times he tends to forget where he left his alcohol. He denies any recent illness, travel. Denies any chest pain, shortness of breath, changes in bowel patterns or urinary frequency. In the emergency department, patient was noted to have a sodium of 120, ammonia level was 36, potassium 2.5. Patient was given banana bag and started on potassium supplementation along with CIWA protocol. [facility] called for admission.   Assessment & Plan  Acute metabolic encephalopathy -Improving -Likely secondary to multifactorial causes including hyponatremia versus alcohol -Ammonia 51 today -Currently afebrile, no leukocytosis -Denies any urinary symptoms or GI changes -CXR showed no active cardiopulmonary disease -PT and OT consulted for eval and treatment- recommended HH/24 hour supervision -Will give one dose of lactulose today  Hyponatremia -Improving, Na 132 -Will discontinue and continue to monitor CMP -Possibly due to alcoholism -TSH  1.055  Hyperammonemia -Secondary to liver disease -Will give one dose of lactulose and continue to monitor  Acute kidney injury -creatinine 1.39 upon admission, currently 0.89 -Possibly secondary to diuretics including Lasix as well as prolactin however patient does not take any medications in several days per wife  Elevated transaminases -Improving, Secondary to alcohol abuse -Continue to monitor CMP and IVF  Alcohol Abuse with hallucinations -Continue CIWA protocol -Continue multivitamin, folate, thiamine -Continues to have agitation- continue ativan and close monitoring  Hypokalemia -Possibly secondary to diuretic use -K 3.8 today -continue to monitor and replace as needed  Pancytopenia -Hb 8.1, plateles 53 (drop likely dilutional) -Likely secondary to liver disease -Continue to monitor CBC  Liver cirrhosis  -Appears to be stable at this time -Secondary to alcohol abuse  ?Fungal infection -Continue ketoconazole cream  Code Status: Full  Family Communication: None at bedside.  Left message for wife.  Disposition Plan: Admitted.  Pending improvement in mental status and labs  Time Spent in minutes   30 minutes  Procedures  None  Consults   None  DVT Prophylaxis  SCDs  Lab Results  Component Value Date   PLT 53* 02/14/2015    Medications  Scheduled Meds: . folic acid  1 mg Oral Daily  . folic acid  1 mg Oral Daily  . ketoconazole  Topical Daily  . multivitamin with minerals  1 tablet Oral Daily  . pneumococcal 23 valent vaccine  0.5 mL Intramuscular Tomorrow-1000  . sodium chloride  3 mL Intravenous Q12H  . tamsulosin  0.4 mg Oral Daily  . thiamine  100 mg Oral Daily   Or  . thiamine  100 mg Intravenous Daily   Continuous Infusions: . 0.9 % NaCl with KCl 40 mEq / L 100 mL/hr (02/12/15 2134)   PRN Meds:.artificial tears, LORazepam **OR** LORazepam, morphine injection, ondansetron **OR** ondansetron (ZOFRAN) IV  Antibiotics     Anti-infectives    None      Subjective:   [name] seen and examined today.  Patient has no complaints today but has been very restless.  Feels everyone has an accent that he cannot understand. Denies chest pain, shortness of breath, abdominal pain, nausea, vomiting, diarrhea, constipation, dizziness, headache.    Objective:   Filed Vitals:   02/13/15 1412 02/13/15 1508 02/13/15 2140 02/14/15 0638  BP: 94/57  109/57 119/55  Pulse:  95 90 85  Temp: 98.5 F (36.9 C)  98.8 F (37.1 C) 97.8 F (36.6 C)  TempSrc: Axillary  Oral Oral  Resp: 18  18 19   Height:      Weight:      SpO2:   97% 98%    Wt Readings from Last 3 Encounters:  02/13/15 77.111 kg (170 lb)  12/05/14 85.095 kg (187 lb 9.6 oz)  10/17/14 78.6 kg (173 lb 4.5 oz)     Intake/Output Summary (Last 24 hours) at 02/14/15 1127 Last data filed at 02/14/15 0913  Gross per 24 hour  Intake      0 ml  Output    625 ml  Net   -625 ml    Exam  General: Well developed, well nourished, restless  HEENT: NCAT, mucous membranes moist.   Cardiovascular: S1 S2 auscultated, RRR, no murmurs  Respiratory: Fine crackles  Abdomen: Soft, nontender, nondistended, + bowel sounds, umbilical hernia  Extremities: warm dry without cyanosis clubbing. Trace LE edema  Neuro: AAOx2, nonfocal, slight tremor/asterixis  Data Review   Micro Results No results found for this or any previous visit (from the past 240 hour(s)).  Radiology Reports Dg Chest 2 View  02/12/2015  CLINICAL DATA:  Shortness of breath.  Evaluate for pneumonia. EXAM: CHEST  2 VIEW COMPARISON:  10/10/2014 FINDINGS: There is no focal parenchymal opacity. There is no pleural effusion or pneumothorax. The heart and mediastinal contours are unremarkable. The osseous structures are unremarkable. Prior lower anterior cervical fusion. IMPRESSION: No active cardiopulmonary disease. Electronically Signed   By: Kathreen Devoid   On: 02/12/2015 16:06   Dg Chest  Port 1 View  02/14/2015  CLINICAL DATA:  Current SOB. No chest pains. Not taking HTN meds. Not diabetic. Nonsmoker. ETOH abuse. Liver cirrhosis. EXAM: PORTABLE CHEST 1 VIEW COMPARISON:  02/12/2015 FINDINGS: The heart size and mediastinal contours are within normal limits. Both lungs are clear. The visualized skeletal structures are unremarkable. IMPRESSION: No active disease. Electronically Signed   By: Nolon Nations M.D.   On: 02/14/2015 09:31    CBC  Recent Labs Lab 02/12/15 1555 02/13/15 0555 02/14/15 0520  WBC 3.6* 2.6* 2.5*  HGB 9.2* 8.4* 8.1*  HCT 26.3* 24.0* 24.1*  PLT 77* 56* 53*  MCV 95.6 97.2 100.4*  MCH 33.5 34.0 33.8  MCHC 35.0 35.0 33.6  RDW 13.2 13.3 13.7    Chemistries   Recent Labs Lab 02/12/15 1555 02/12/15  2145 02/13/15 0555 02/14/15 0520  NA 120*  --  129* 132*  K 2.5*  --  2.6* 3.8  CL 79*  --  94* 102  CO2 32  --  26 24  GLUCOSE 202*  --  138* 88  BUN 29*  --  20 15  CREATININE 1.39*  --  0.89 0.82  CALCIUM 8.8*  --  8.4* 8.6*  MG  --  1.8  --   --   AST 199*  --  144* 122*  ALT 63  --  47 43  ALKPHOS 98  --  73 73  BILITOT 1.7*  --  1.6* 1.7*   ------------------------------------------------------------------------------------------------------------------ estimated creatinine clearance is 89.6 mL/min (by C-G formula based on Cr of 0.82). ------------------------------------------------------------------------------------------------------------------ No results for input(s): HGBA1C in the last 72 hours. ------------------------------------------------------------------------------------------------------------------ No results for input(s): CHOL, HDL, LDLCALC, TRIG, CHOLHDL, LDLDIRECT in the last 72 hours. ------------------------------------------------------------------------------------------------------------------  Recent Labs  02/12/15 2030  TSH 1.055    ------------------------------------------------------------------------------------------------------------------ No results for input(s): VITAMINB12, FOLATE, FERRITIN, TIBC, IRON, RETICCTPCT in the last 72 hours.  Coagulation profile No results for input(s): INR, PROTIME in the last 168 hours.  No results for input(s): DDIMER in the last 72 hours.  Cardiac Enzymes No results for input(s): CKMB, TROPONINI, MYOGLOBIN in the last 168 hours.  Invalid input(s): CK ------------------------------------------------------------------------------------------------------------------ Invalid input(s): POCBNP  Emmalina Espericueta D.O. on 02/14/2015 at 11:27 AM  Between 7am to 7pm - Pager - [phone]  After 7pm go to [email or web address] - password [email or web address]  And look for the night coverage person covering for me after hours  [facility] Office  [phone]

## 2015-02-15 LAB — CBC
HCT: 24.8 % — ABNORMAL LOW (ref 39.0–52.0)
HEMOGLOBIN: 8.4 g/dL — AB (ref 13.0–17.0)
MCH: 33.7 pg (ref 26.0–34.0)
MCHC: 33.9 g/dL (ref 30.0–36.0)
MCV: 99.6 fL (ref 78.0–100.0)
Platelets: 64 10*3/uL — ABNORMAL LOW (ref 150–400)
RBC: 2.49 MIL/uL — AB (ref 4.22–5.81)
RDW: 13.7 % (ref 11.5–15.5)
WBC: 3.6 10*3/uL — AB (ref 4.0–10.5)

## 2015-02-15 LAB — AMMONIA: Ammonia: 38 umol/L — ABNORMAL HIGH (ref 9–35)

## 2015-02-15 LAB — COMPREHENSIVE METABOLIC PANEL
ALK PHOS: 82 U/L (ref 38–126)
ALT: 43 U/L (ref 17–63)
AST: 108 U/L — AB (ref 15–41)
Albumin: 2.9 g/dL — ABNORMAL LOW (ref 3.5–5.0)
Anion gap: 7 (ref 5–15)
BUN: 12 mg/dL (ref 6–20)
CALCIUM: 8.7 mg/dL — AB (ref 8.9–10.3)
CO2: 23 mmol/L (ref 22–32)
CREATININE: 0.72 mg/dL (ref 0.61–1.24)
Chloride: 101 mmol/L (ref 101–111)
GFR calc non Af Amer: >60 mL/min (ref >60)
GLUCOSE: 99 mg/dL (ref 65–99)
Potassium: 3.9 mmol/L (ref 3.5–5.1)
SODIUM: 131 mmol/L — AB (ref 135–145)
Total Bilirubin: 1.4 mg/dL — ABNORMAL HIGH (ref 0.3–1.2)
Total Protein: 6.6 g/dL (ref 6.5–8.1)

## 2015-02-15 MED ORDER — LACTULOSE 10 GM/15ML PO SOLN
20.0000 g | Freq: Once | ORAL | Status: AC
  Administered 2015-02-15: 20 g via ORAL
  Filled 2015-02-15: qty 30

## 2015-02-15 NOTE — Progress Notes (Signed)
[facility]                                                                              Patient Demographics  [name], is a 59 y.o. male, DOB - [birth year], [id number]  Admit date - 02/12/2015   Admitting Physician Cristal Ford, DO  Outpatient Primary MD for the patient is Woody Seller, MD  LOS - 3   Chief Complaint  Patient presents with  . Altered Mental Status  . Hallucinations      HPI on 02/12/2015  [name] is a 59 y.o. male with a history of alcohol abuse, liver services, thrombocytopenia, who presented to the emergency department with complaints of confusion and behavior changes. Patient is accompanied by his wife. Patient is able to answer most questions however does deviate. States he's had "itchiness, increased fidgetiness"for the past day or so. Per his wife, he has had increased confusion and behavior changes. Patient has still been drinking alcohol however had some earlier this morning. Most times he tends to forget where he left his alcohol. He denies any recent illness, travel. Denies any chest pain, shortness of breath, changes in bowel patterns or urinary frequency. In the emergency department, patient was noted to have a sodium of 120, ammonia level was 36, potassium 2.5. Patient was given banana bag and started on potassium supplementation along with CIWA protocol. [facility] called for admission.   Assessment & Plan  Acute metabolic encephalopathy -Improving, but very restless and fidgety -Likely secondary to multifactorial causes including hyponatremia versus alcohol -Ammonia 38 today -Currently afebrile, no leukocytosis -Denies any urinary symptoms or GI changes -CXR showed no active cardiopulmonary disease -PT and OT consulted for eval and treatment- recommended HH/24 hour supervision  Hyponatremia -Improving, Na 131 (was 120 at admission) -Will discontinue and continue to monitor CMP -Possibly due to alcoholism -TSH  1.055  Hyperammonemia -Secondary to liver disease -Ammonia improved this morning, currently 38.   -Will given an additional dose of lactulose and continue to monitor   Acute kidney injury -creatinine 1.39 upon admission, currently 0.72 -Possibly secondary to diuretics including Lasix as well as prolactin however patient does not take any medications in several days per wife  Elevated transaminases -Improving, Secondary to alcohol abuse -Continue to monitor CMP and IVF  Alcohol Abuse with hallucinations -Continue CIWA protocol -Continue multivitamin, folate, thiamine -Continues to have agitation- continue ativan and close monitoring  Hypokalemia -Possibly secondary to diuretic use -K 3.9 today -continue to monitor and replace as needed  Pancytopenia -Hb 8.4, plateles 64 (drop likely dilutional) -WBC improving 3.6 -Likely secondary to liver disease -Continue to monitor CBC  Liver cirrhosis  -Appears to be stable at this time -Secondary to alcohol abuse  ?Fungal infection -Continue ketoconazole cream  Code Status: Full  Family Communication: Wife at bedside.  Disposition Plan: Admitted.  Pending improvement in mental status   Time Spent in minutes   30 minutes  Procedures  None  Consults   None  DVT Prophylaxis  SCDs  Lab Results  Component Value Date   PLT 64* 02/15/2015    Medications  Scheduled Meds: . folic acid  1 mg Oral Daily  . folic acid  1 mg Oral Daily  . ketoconazole   Topical Daily  . lactulose  20 g Oral Once  . multivitamin with minerals  1 tablet Oral Daily  . pneumococcal 23 valent vaccine  0.5 mL Intramuscular Tomorrow-1000  . sodium chloride  3 mL Intravenous Q12H  . tamsulosin  0.4 mg Oral Daily  . thiamine  100 mg Oral Daily   Continuous Infusions: . sodium chloride 50 mL/hr at 02/15/15 0637   PRN Meds:.artificial tears, LORazepam **OR** LORazepam, morphine injection, ondansetron **OR** ondansetron (ZOFRAN) IV  Antibiotics     Anti-infectives    None      Subjective:   [name] seen and examined today.  Patient has no complaints today but states he is in jail and the man put him here.  He denies chest pain, shortness of breath, abdominal pain, nausea, vomiting, diarrhea, constipation, dizziness, headache.  He would like to go home.    Objective:   Filed Vitals:   02/14/15 2143 02/14/15 2200 02/15/15 0040 02/15/15 0403  BP: 146/73 146/73 140/92 119/79  Pulse: 99 99 88 85  Temp: 97.5 F (36.4 C)  97.4 F (36.3 C) 97.9 F (36.6 C)  TempSrc: Oral  Oral Oral  Resp: 18  18 20   Height:      Weight:      SpO2: 98%  99% 98%    Wt Readings from Last 3 Encounters:  02/13/15 77.111 kg (170 lb)  12/05/14 85.095 kg (187 lb 9.6 oz)  10/17/14 78.6 kg (173 lb 4.5 oz)     Intake/Output Summary (Last 24 hours) at 02/15/15 1056 Last data filed at 02/15/15 [id number]  Gross per 24 hour  Intake 855.67 ml  Output    800 ml  Net  55.67 ml    Exam  General: Well developed, well nourished, restless  HEENT: NCAT, mucous membranes moist.   Cardiovascular: S1 S2 auscultated, RRR, no murmurs  Respiratory: Clear to auscultation   Abdomen: Soft, nontender, nondistended, + bowel sounds, umbilical hernia  Extremities: warm dry without cyanosis clubbing, no edema  Neuro: AAOx1, nonfocal, slight tremor/asterixis- improving  Data Review   Micro Results No results found for this or any previous visit (from the past 240 hour(s)).  Radiology Reports Dg Chest 2 View  02/12/2015  CLINICAL DATA:  Shortness of breath.  Evaluate for pneumonia. EXAM: CHEST  2 VIEW COMPARISON:  10/10/2014 FINDINGS: There is no focal parenchymal opacity. There is no pleural effusion or pneumothorax. The heart and mediastinal contours are unremarkable. The osseous structures are unremarkable. Prior lower anterior cervical fusion. IMPRESSION: No active cardiopulmonary disease. Electronically Signed   By: Kathreen Devoid   On: 02/12/2015  16:06   Dg Chest Port 1 View  02/14/2015  CLINICAL DATA:  Current SOB. No chest pains. Not taking HTN meds. Not diabetic. Nonsmoker. ETOH abuse. Liver cirrhosis. EXAM: PORTABLE CHEST 1 VIEW COMPARISON:  02/12/2015 FINDINGS: The heart size and mediastinal contours are within normal limits. Both lungs are clear. The visualized skeletal structures are unremarkable. IMPRESSION: No active disease. Electronically Signed   By: Nolon Nations M.D.   On: 02/14/2015 09:31    CBC  Recent Labs Lab 02/12/15 1555 02/13/15 0555 02/14/15 0520 02/15/15 0620  WBC 3.6* 2.6* 2.5* 3.6*  HGB 9.2* 8.4* 8.1* 8.4*  HCT 26.3* 24.0* 24.1* 24.8*  PLT 77* 56* 53* 64*  MCV 95.6 97.2 100.4* 99.6  MCH 33.5 34.0 33.8 33.7  MCHC 35.0 35.0 33.6 33.9  RDW 13.2 13.3 13.7 13.7  Chemistries   Recent Labs Lab 02/12/15 1555 02/12/15 2145 02/13/15 0555 02/14/15 0520 02/15/15 0620  NA 120*  --  129* 132* 131*  K 2.5*  --  2.6* 3.8 3.9  CL 79*  --  94* 102 101  CO2 32  --  26 24 23   GLUCOSE 202*  --  138* 88 99  BUN 29*  --  20 15 12   CREATININE 1.39*  --  0.89 0.82 0.72  CALCIUM 8.8*  --  8.4* 8.6* 8.7*  MG  --  1.8  --   --   --   AST 199*  --  144* 122* 108*  ALT 63  --  47 43 43  ALKPHOS 98  --  73 73 82  BILITOT 1.7*  --  1.6* 1.7* 1.4*   ------------------------------------------------------------------------------------------------------------------ estimated creatinine clearance is 91.8 mL/min (by C-G formula based on Cr of 0.72). ------------------------------------------------------------------------------------------------------------------ No results for input(s): HGBA1C in the last 72 hours. ------------------------------------------------------------------------------------------------------------------ No results for input(s): CHOL, HDL, LDLCALC, TRIG, CHOLHDL, LDLDIRECT in the last 72  hours. ------------------------------------------------------------------------------------------------------------------  Recent Labs  02/12/15 2030  TSH 1.055   ------------------------------------------------------------------------------------------------------------------ No results for input(s): VITAMINB12, FOLATE, FERRITIN, TIBC, IRON, RETICCTPCT in the last 72 hours.  Coagulation profile No results for input(s): INR, PROTIME in the last 168 hours.  No results for input(s): DDIMER in the last 72 hours.  Cardiac Enzymes No results for input(s): CKMB, TROPONINI, MYOGLOBIN in the last 168 hours.  Invalid input(s): CK ------------------------------------------------------------------------------------------------------------------ Invalid input(s): POCBNP  [name] D.O. on 02/15/2015 at 10:56 AM  Between 7am to 7pm - Pager - [phone]  After 7pm go to [email or web address] - password [email or web address]  And look for the night coverage person covering for me after hours  [facility] Office  [phone]

## 2015-02-16 ENCOUNTER — Encounter (HOSPITAL_COMMUNITY): Payer: Self-pay | Admitting: Radiology

## 2015-02-16 ENCOUNTER — Inpatient Hospital Stay (HOSPITAL_COMMUNITY): Payer: 59

## 2015-02-16 DIAGNOSIS — F10959 Alcohol use, unspecified with alcohol-induced psychotic disorder, unspecified: Secondary | ICD-10-CM

## 2015-02-16 DIAGNOSIS — F1020 Alcohol dependence, uncomplicated: Secondary | ICD-10-CM | POA: Diagnosis present

## 2015-02-16 DIAGNOSIS — F10921 Alcohol use, unspecified with intoxication delirium: Secondary | ICD-10-CM | POA: Diagnosis present

## 2015-02-16 HISTORY — DX: Alcohol dependence, uncomplicated: F10.20

## 2015-02-16 LAB — COMPREHENSIVE METABOLIC PANEL WITH GFR
ALT: 37 U/L (ref 17–63)
AST: 84 U/L — ABNORMAL HIGH (ref 15–41)
Albumin: 2.7 g/dL — ABNORMAL LOW (ref 3.5–5.0)
Alkaline Phosphatase: 72 U/L (ref 38–126)
Anion gap: 7 (ref 5–15)
BUN: 12 mg/dL (ref 6–20)
CO2: 21 mmol/L — ABNORMAL LOW (ref 22–32)
Calcium: 8.6 mg/dL — ABNORMAL LOW (ref 8.9–10.3)
Chloride: 105 mmol/L (ref 101–111)
Creatinine, Ser: 0.74 mg/dL (ref 0.61–1.24)
GFR calc Af Amer: >60 mL/min
GFR calc non Af Amer: >60 mL/min
Glucose, Bld: 101 mg/dL — ABNORMAL HIGH (ref 65–99)
Potassium: 4.1 mmol/L (ref 3.5–5.1)
Sodium: 133 mmol/L — ABNORMAL LOW (ref 135–145)
Total Bilirubin: 1.5 mg/dL — ABNORMAL HIGH (ref 0.3–1.2)
Total Protein: 6.4 g/dL — ABNORMAL LOW (ref 6.5–8.1)

## 2015-02-16 LAB — URINALYSIS, ROUTINE W REFLEX MICROSCOPIC
Bilirubin Urine: NEGATIVE
Glucose, UA: NEGATIVE mg/dL
Hgb urine dipstick: NEGATIVE
Ketones, ur: NEGATIVE mg/dL
Leukocytes, UA: NEGATIVE
Nitrite: NEGATIVE
Protein, ur: NEGATIVE mg/dL
Specific Gravity, Urine: 1.015 (ref 1.005–1.030)
pH: 7 (ref 5.0–8.0)

## 2015-02-16 LAB — AMMONIA: AMMONIA: 24 umol/L (ref 9–35)

## 2015-02-16 MED ORDER — LORAZEPAM 1 MG PO TABS: 0.0000 mg | ORAL_TABLET | Freq: Two times a day (BID) | ORAL | Status: DC

## 2015-02-16 MED ORDER — THIAMINE HCL 100 MG/ML IJ SOLN
100.0000 mg | Freq: Every day | INTRAMUSCULAR | Status: DC
  Filled 2015-02-16: qty 1

## 2015-02-16 MED ORDER — ADULT MULTIVITAMIN W/MINERALS CH: 1.0000 | ORAL_TABLET | Freq: Every day | ORAL | Status: DC

## 2015-02-16 MED ORDER — TRAZODONE HCL 50 MG PO TABS
50.0000 mg | ORAL_TABLET | Freq: Every day | ORAL | Status: DC
  Administered 2015-02-16: 50 mg via ORAL
  Filled 2015-02-16 (×2): qty 1

## 2015-02-16 MED ORDER — FOLIC ACID 1 MG PO TABS
1.0000 mg | ORAL_TABLET | Freq: Every day | ORAL | Status: DC
  Administered 2015-02-17: 1 mg via ORAL
  Filled 2015-02-16: qty 1

## 2015-02-16 MED ORDER — LORAZEPAM 2 MG/ML IJ SOLN: 1.0000 mg | Freq: Four times a day (QID) | INTRAMUSCULAR | Status: DC | PRN

## 2015-02-16 MED ORDER — LORAZEPAM 1 MG PO TABS
1.0000 mg | ORAL_TABLET | Freq: Four times a day (QID) | ORAL | Status: DC | PRN
  Administered 2015-02-16: 1 mg via ORAL

## 2015-02-16 MED ORDER — LORAZEPAM 1 MG PO TABS
0.0000 mg | ORAL_TABLET | Freq: Four times a day (QID) | ORAL | Status: DC
  Administered 2015-02-16: 2 mg via ORAL
  Filled 2015-02-16: qty 2

## 2015-02-16 MED ORDER — VITAMIN B-1 100 MG PO TABS
100.0000 mg | ORAL_TABLET | Freq: Every day | ORAL | Status: DC
  Administered 2015-02-17: 100 mg via ORAL
  Filled 2015-02-16: qty 1

## 2015-02-16 MED ORDER — LORAZEPAM 1 MG PO TABS: 1.0000 mg | ORAL_TABLET | Freq: Four times a day (QID) | ORAL | Status: DC | PRN

## 2015-02-16 NOTE — Consult Note (Signed)
[facility] Face-to-Face Psychiatry Consult   Reason for Consult: shaking tremors, anxiety, alcohol delirium Referring Physician:  EDP Patient Identification: [name] MRN:  [id number] Principal Diagnosis: Alcohol use with intoxication delirium [facility]) Diagnosis:   Patient Active Problem List   Diagnosis Date Noted  . Alcohol use disorder, severe, dependence ([place]) [F10.20] 02/16/2015    Priority: High  . Alcohol use with intoxication delirium ([place]) [F10.921] 02/16/2015    Priority: High  . Alcohol use with alcohol-induced psychotic disorder [facility]) [F10.959] 02/16/2015    Priority: High  . Hyponatremia [E87.1] 02/12/2015    Priority: Medium  . AKI (acute kidney injury) ([place]) [N17.9] 02/12/2015    Priority: Medium  . Encephalopathy [G93.40] 02/12/2015  . Alcoholic cirrhosis of liver with ascites ([place]) [K70.31]   . Acute blood loss anemia [D62]   . Upper GI bleed [K92.2]   . Thrombocytopenia ([place]) [D69.6] 10/10/2014  . GI bleed [K92.2] 10/10/2014  . Cirrhosis of liver ([place]) [K74.60] 10/10/2014  . Pulmonary edema [J81.1] 10/10/2014  . Pancytopenia ([place]) [O03.212] 10/10/2014  . Cellulitis [L03.90] 07/29/2014  . Scrotal swelling [N50.89] 07/29/2014  . Anemia [D64.9] 04/12/2008  . Alcohol abuse [F10.10] 04/12/2008  . GERD [K21.9] 04/12/2008  . PANCREATITIS [K85.90] 04/12/2008  . Blood in stool [K92.1] 04/12/2008  . Other malaise and fatigue [R53.81, R53.83] 04/12/2008  . HEMOPTYSIS [786.3] 04/12/2008  . NAUSEA AND VOMITING [R11.2] 04/12/2008  . EPIGASTRIC PAIN [R10.13] 04/12/2008  . ALCOHOLIC HEPATITIS, HX OF [Z87.19] 04/12/2008    Total Time spent with patient: 1 hour  Subjective:   [name] is a 59 y.o. male patient admitted with alcohol delirium.  HPI: Thanks for asking me to do a psychiatric consultation on [name], a 59 y.o. male with a history of alcohol use disorder, liver Cirrhosis, thrombocytopenia, who was brought to [name] ED yesterday by his wife  due to confusion and behavior changes. Patient reports that he has been drinking Alcohol almost on a daily basis since the age of 11, drinks 10-12 bottles of beer every day. His last drink was 2 days after Thanksgiving. Patient reports that he started experiencing withdrawal symptoms 2 days ago characterized by confusion, seeing shadows on the wall, anxiety, fidgety, shaking tremors, difficulty sleeping, poor concentration, nausea and vomiting. Patient denies other drugs of abuse or depressive symptoms.   Past Psychiatric History: Alcohol dependence  Risk to Self: Is patient at risk for suicide?: No Risk to Others:   Prior Inpatient Therapy:   Prior Outpatient Therapy:    Past Medical History:  Past Medical History  Diagnosis Date  . GERD (gastroesophageal reflux disease)   . 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 Transfusion history 08-16-12    '99  . Pancreatitis 08-16-12    at present weakness,fatiques easily  . Foreign body 08-16-12    nose"BB" pellet since age 60  . History of ETOH abuse 08-16-12    Heavy alcohol use daily  . Anemia   . Anxiety   . Clotting disorder ([place])   . Substance abuse   . Cirrhosis [facility])     Past Surgical History  Procedure Laterality Date  . Neck surgery      fusion of neck-titanium hardware inplaced  . Esophagogastroduodenoscopy    . Colonoscopy    . Eus N/A 08/18/2012    Procedure: UPPER ENDOSCOPIC ULTRASOUND (EUS) LINEAR;  Surgeon: Beryle Beams, MD;  Location: [facility] ENDOSCOPY;  Service: Endoscopy;  Laterality: N/A;  . Neck surgery  [id number]   Family  History:  Family History  Problem Relation Age of Onset  . Hypertension Father   . Cancer Mother   . Stroke Mother    Family Psychiatric  History: Social History:  History  Alcohol Use  . 50.4 oz/week  . 84 Cans of beer per week    Comment: 12 cans beer per day     History  Drug Use No    Social History   Social History  . Marital Status: Married    Spouse Name: N/A  . Number of  Children: N/A  . Years of Education: N/A   Social History Main Topics  . Smoking status: Former Smoker    Types: Cigarettes    Quit date: 08/16/2005  . Smokeless tobacco: Never Used  . Alcohol Use: 50.4 oz/week    84 Cans of beer per week     Comment: 12 cans beer per day  . Drug Use: No  . Sexual Activity: Yes   Other Topics Concern  . None   Social History Narrative   Additional Social History:                          Allergies:   Allergies  Allergen Reactions  . Aspirin Other (See Comments)    Has upset stomach in high doses only. Took 4 aspirin with EMS, no reaction.    Labs:  Results for orders placed or performed during the hospital encounter of 02/12/15 (from the past 48 hour(s))  CBC     Status: Abnormal   Collection Time: 02/15/15  6:20 AM  Result Value Ref Range   WBC 3.6 (L) 4.0 - 10.5 K/uL   RBC 2.49 (L) 4.22 - 5.81 MIL/uL   Hemoglobin 8.4 (L) 13.0 - 17.0 g/dL   HCT 24.8 (L) 39.0 - 52.0 %   MCV 99.6 78.0 - 100.0 fL   MCH 33.7 26.0 - 34.0 pg   MCHC 33.9 30.0 - 36.0 g/dL   RDW 13.7 11.5 - 15.5 %   Platelets 64 (L) 150 - 400 K/uL    Comment: CONSISTENT WITH PREVIOUS RESULT  Comprehensive metabolic panel     Status: Abnormal   Collection Time: 02/15/15  6:20 AM  Result Value Ref Range   Sodium 131 (L) 135 - 145 mmol/L   Potassium 3.9 3.5 - 5.1 mmol/L   Chloride 101 101 - 111 mmol/L   CO2 23 22 - 32 mmol/L   Glucose, Bld 99 65 - 99 mg/dL   BUN 12 6 - 20 mg/dL   Creatinine, Ser 0.72 0.61 - 1.24 mg/dL   Calcium 8.7 (L) 8.9 - 10.3 mg/dL   Total Protein 6.6 6.5 - 8.1 g/dL   Albumin 2.9 (L) 3.5 - 5.0 g/dL   AST 108 (H) 15 - 41 U/L   ALT 43 17 - 63 U/L   Alkaline Phosphatase 82 38 - 126 U/L   Total Bilirubin 1.4 (H) 0.3 - 1.2 mg/dL   GFR calc non Af Amer >60 >60 mL/min   GFR calc Af Amer >60 >60 mL/min    Comment: (NOTE) The eGFR has been calculated using the CKD EPI equation. This calculation has not been validated in all clinical  situations. eGFR's persistently <60 mL/min signify possible Chronic Kidney Disease.    Anion gap 7 5 - 15  Ammonia     Status: Abnormal   Collection Time: 02/15/15  6:20 AM  Result Value Ref Range   Ammonia 38 (H)  9 - 35 umol/L  Ammonia     Status: None   Collection Time: 02/16/15  5:35 AM  Result Value Ref Range   Ammonia 24 9 - 35 umol/L  Comprehensive metabolic panel     Status: Abnormal   Collection Time: 02/16/15  5:35 AM  Result Value Ref Range   Sodium 133 (L) 135 - 145 mmol/L   Potassium 4.1 3.5 - 5.1 mmol/L   Chloride 105 101 - 111 mmol/L   CO2 21 (L) 22 - 32 mmol/L   Glucose, Bld 101 (H) 65 - 99 mg/dL   BUN 12 6 - 20 mg/dL   Creatinine, Ser 0.74 0.61 - 1.24 mg/dL   Calcium 8.6 (L) 8.9 - 10.3 mg/dL   Total Protein 6.4 (L) 6.5 - 8.1 g/dL   Albumin 2.7 (L) 3.5 - 5.0 g/dL   AST 84 (H) 15 - 41 U/L   ALT 37 17 - 63 U/L   Alkaline Phosphatase 72 38 - 126 U/L   Total Bilirubin 1.5 (H) 0.3 - 1.2 mg/dL   GFR calc non Af Amer >60 >60 mL/min   GFR calc Af Amer >60 >60 mL/min    Comment: (NOTE) The eGFR has been calculated using the CKD EPI equation. This calculation has not been validated in all clinical situations. eGFR's persistently <60 mL/min signify possible Chronic Kidney Disease.    Anion gap 7 5 - 15    Current Facility-Administered Medications  Medication Dose Route Frequency Provider Last Rate Last Dose  . 0.9 %  sodium chloride infusion   Intravenous Continuous Maryann Mikhail, DO 50 mL/hr at 02/16/15 0159    . artificial tears (LACRILUBE) ophthalmic ointment   Right Eye Daily PRN Maryann Mikhail, DO      . [START ON 21/05/863] folic acid (FOLVITE) tablet 1 mg  1 mg Oral Daily Micalah Cabezas      . ketoconazole (NIZORAL) 2 % cream   Topical Daily Maryann Mikhail, DO      . LORazepam (ATIVAN) tablet 1 mg  1 mg Oral Q6H PRN Megen Madewell       Or  . LORazepam (ATIVAN) injection 1 mg  1 mg Intravenous Q6H PRN Leata Dominy      . LORazepam (ATIVAN) tablet  0-4 mg  0-4 mg Oral 4 times per day Jesilyn Easom       Followed by  . [START ON 02/18/2015] LORazepam (ATIVAN) tablet 0-4 mg  0-4 mg Oral Q12H Darlette Dubow      . morphine 2 MG/ML injection 2 mg  2 mg Intravenous Q6H PRN Maryann Mikhail, DO   2 mg at 02/14/15 2049  . multivitamin with minerals tablet 1 tablet  1 tablet Oral Daily Maryann Mikhail, DO   1 tablet at 02/16/15 0913  . ondansetron (ZOFRAN) tablet 4 mg  4 mg Oral Q6H PRN Maryann Mikhail, DO       Or  . ondansetron (ZOFRAN) injection 4 mg  4 mg Intravenous Q6H PRN Maryann Mikhail, DO      . pneumococcal 23 valent vaccine (PNU-IMMUNE) injection 0.5 mL  0.5 mL Intramuscular Tomorrow-1000 Maryann Mikhail, DO   0.5 mL at 02/13/15 1000  . sodium chloride 0.9 % injection 3 mL  3 mL Intravenous Q12H Maryann Mikhail, DO   3 mL at 02/16/15 0914  . tamsulosin (FLOMAX) capsule 0.4 mg  0.4 mg Oral Daily Maryann Mikhail, DO   0.4 mg at 02/16/15 0913  . [START ON 02/17/2015] thiamine (VITAMIN B-1) tablet 100  mg  100 mg Oral Daily Ilija Maxim       Or  . [START ON 02/17/2015] thiamine (B-1) injection 100 mg  100 mg Intravenous Daily Nevaeha Finerty      . traZODone (DESYREL) tablet 50 mg  50 mg Oral QHS [place]        Musculoskeletal: Strength & Muscle Tone: within normal limits Gait & Station: unsteady Patient leans: Front  Psychiatric Specialty Exam: Review of Systems  Constitutional: Positive for malaise/fatigue and diaphoresis.  HENT: Negative.   Eyes: Negative.   Respiratory: Negative.   Cardiovascular: Negative.   Gastrointestinal: Positive for nausea.  Genitourinary: Negative.   Musculoskeletal: Positive for myalgias.  Skin: Negative.   Neurological: Positive for tremors and weakness.  Endo/Heme/Allergies: Negative.   Psychiatric/Behavioral: Positive for substance abuse. The patient is nervous/anxious and has insomnia.     Blood pressure 132/75, pulse 83, temperature 98.2 F (36.8 C), temperature source Oral,  resp. rate 18, height _0  (1.702 m), weight 75.524 kg (166 lb 8 oz), SpO2 99 %.Body mass index is 26.07 kg/(m^2).  General Appearance: Disheveled  Eye Contact::  Minimal  Speech:  Garbled  Volume:  Decreased  Mood:  Anxious  Affect:  Constricted  Thought Process:  Goal Directed  Orientation:  Full (Time, Place, and Person)  Thought Content:  Hallucinations: Visual  Suicidal Thoughts:  No  Homicidal Thoughts:  No  Memory:  Immediate;   Fair Recent;   Fair Remote;   Fair  Judgement:  Impaired  Insight:  Lacking  Psychomotor Activity:  Restlessness and Tremor  Concentration:  Fair  Recall:  [employer] of Knowledge:Good  Language: Good  Akathisia:  No  Handed:  Right  AIMS (if indicated):     Assets:  Communication Skills Social Support  ADL's:  Intact  Cognition: WNL  Sleep:   poor   Treatment Plan Summary: Daily contact with patient to assess and evaluate symptoms and progress in treatment.  Medication management: -Continue Ativan Alcohol withdrawal symptoms. -Start Trazodone 23m Qhs for sleep/alcohol withdrawal.  Disposition:  -No evidence of imminent risk to self or others at present.   -Patient does not meet criteria for psychiatric inpatient admission. -Supportive therapy provided about ongoing stressors. -Patient will benefit from referall to Alcohol Rehab program when medically stable.  ACorena Pilgrim MD 02/16/2015 1:35 PM

## 2015-02-16 NOTE — Progress Notes (Signed)
[facility]                                                                              Patient Demographics  [name], is a 59 y.o. male, DOB - 03-06-1956, [id number]  Admit date - 02/12/2015   Admitting Physician Cristal Ford, DO  Outpatient Primary MD for the patient is Woody Seller, MD  LOS - 4   Chief Complaint  Patient presents with  . Altered Mental Status  . Hallucinations      HPI on 02/12/2015  [name] is a 59 y.o. male with a history of alcohol abuse, liver services, thrombocytopenia, who presented to the emergency department with complaints of confusion and behavior changes. Patient is accompanied by his wife. Patient is able to answer most questions however does deviate. States he's had "itchiness, increased fidgetiness"for the past day or so. Per his wife, he has had increased confusion and behavior changes. Patient has still been drinking alcohol however had some earlier this morning. Most times he tends to forget where he left his alcohol. He denies any recent illness, travel. Denies any chest pain, shortness of breath, changes in bowel patterns or urinary frequency. In the emergency department, patient was noted to have a sodium of 120, ammonia level was 36, potassium 2.5. Patient was given banana bag and started on potassium supplementation along with CIWA protocol. TRH called for admission.   Assessment & Plan  Acute metabolic encephalopathy -Mentation improving, however continues to be very fidgety -Likely secondary to multifactorial causes including hyponatremia versus alcohol -Ammonia 24 today -Currently afebrile, no leukocytosis -Denies any urinary symptoms or GI changes -CXR showed no active cardiopulmonary disease -PT and OT consulted for eval and treatment- recommended HH/24 hour supervision -Will consult psychiatry  -Obtain CT head  Alcohol Abuse with hallucinations -Continue CIWA protocol -Continue multivitamin,  folate, thiamine -Continues to have agitation- continue ativan and close monitoring -psych consulted and appreciated   Hyponatremia -Improving, Na 133 (was 120 at admission) -Will discontinued IVF and continue to monitor CMP -Possibly due to alcoholism -TSH 1.055  Hyperammonemia -Secondary to liver disease -Ammonia improved this morning, currently 38.   -Will given an additional dose of lactulose and continue to monitor   Acute kidney injury -creatinine 1.39 upon admission, currently 0.74 -Possibly secondary to diuretics including Lasix as well as prolactin however patient does not take any medications in several days per wife  Elevated transaminases -Improving, Secondary to alcohol abuse -Continue to monitor CMP and IVF  Hypokalemia -Possibly secondary to diuretic use -K 4.1 today -continue to monitor and replace as needed  Pancytopenia -Hb 8.4, plateles 64 (drop likely dilutional) -WBC improving 3.6 -Likely secondary to liver disease -Continue to monitor CBC  Liver cirrhosis  -Appears to be stable at this time -Secondary to alcohol abuse  ?Fungal infection -Continue ketoconazole cream  Code Status: Full  Family Communication: Wife at bedside.  Disposition Plan: Admitted.  Pending improvement in mental status   Time Spent in minutes   30 minutes  Procedures  None  Consults   Psychiatry   DVT Prophylaxis  SCDs  Lab Results  Component Value Date   PLT 64* 02/15/2015    Medications  Scheduled Meds: . folic acid  1 mg Oral Daily  . folic acid  1 mg Oral Daily  . ketoconazole   Topical Daily  . multivitamin with minerals  1 tablet Oral Daily  . pneumococcal 23 valent vaccine  0.5 mL Intramuscular Tomorrow-1000  . sodium chloride  3 mL Intravenous Q12H  . tamsulosin  0.4 mg Oral Daily  . thiamine  100 mg Oral Daily   Continuous Infusions: . sodium chloride 50 mL/hr at 02/16/15 0159   PRN Meds:.artificial tears, morphine injection, ondansetron  **OR** ondansetron (ZOFRAN) IV  Antibiotics    Anti-infectives    None      Subjective:   [name] seen and examined today.  Patient has no complaints today but would like to go home.  Per RN, CIWA score >11. He denies chest pain, shortness of breath, abdominal pain, nausea, vomiting, diarrhea, constipation, dizziness, headache.   Per wife at bedside, "this is the worst he has every been."   Objective:   Filed Vitals:   02/15/15 2045 02/15/15 2145 02/16/15 0008 02/16/15 0515  BP: 147/94 115/69 142/75 132/75  Pulse: 86 89 83 83  Temp:  98.2 F (36.8 C) 99.2 F (37.3 C) 98.2 F (36.8 C)  TempSrc:  Oral Oral Oral  Resp:  18 18 18   Height:      Weight:    75.524 kg (166 lb 8 oz)  SpO2:  98% 99% 99%    Wt Readings from Last 3 Encounters:  02/16/15 75.524 kg (166 lb 8 oz)  12/05/14 85.095 kg (187 lb 9.6 oz)  10/17/14 78.6 kg (173 lb 4.5 oz)     Intake/Output Summary (Last 24 hours) at 02/16/15 1051 Last data filed at 02/15/15 2146  Gross per 24 hour  Intake    360 ml  Output    150 ml  Net    210 ml    Exam  General: Well developed, well nourished, restless  HEENT: NCAT, mucous membranes moist.   Cardiovascular: S1 S2 auscultated, RRR, no murmurs  Respiratory: Clear to auscultation   Abdomen: Soft, nontender, nondistended, + bowel sounds, umbilical hernia  Extremities: warm dry without cyanosis clubbing, no edema  Neuro: AAOx2, nonfocal, moving all extremities sporadically   Data Review   Micro Results No results found for this or any previous visit (from the past 240 hour(s)).  Radiology Reports Dg Chest 2 View  02/12/2015  CLINICAL DATA:  Shortness of breath.  Evaluate for pneumonia. EXAM: CHEST  2 VIEW COMPARISON:  10/10/2014 FINDINGS: There is no focal parenchymal opacity. There is no pleural effusion or pneumothorax. The heart and mediastinal contours are unremarkable. The osseous structures are unremarkable. Prior lower anterior cervical  fusion. IMPRESSION: No active cardiopulmonary disease. Electronically Signed   By: Kathreen Devoid   On: 02/12/2015 16:06   Dg Chest Port 1 View  02/14/2015  CLINICAL DATA:  Current SOB. No chest pains. Not taking HTN meds. Not diabetic. Nonsmoker. ETOH abuse. Liver cirrhosis. EXAM: PORTABLE CHEST 1 VIEW COMPARISON:  02/12/2015 FINDINGS: The heart size and mediastinal contours are within normal limits. Both lungs are clear. The visualized skeletal structures are unremarkable. IMPRESSION: No active disease. Electronically Signed   By: Nolon Nations M.D.   On: 02/14/2015 09:31    CBC  Recent Labs Lab 02/12/15 1555 02/13/15 0555 02/14/15 0520 02/15/15 0620  WBC 3.6* 2.6* 2.5* 3.6*  HGB 9.2* 8.4* 8.1* 8.4*  HCT 26.3* 24.0* 24.1* 24.8*  PLT 77* 56* 53* 64*  MCV 95.6 97.2 100.4* 99.6  MCH 33.5 34.0 33.8 33.7  MCHC 35.0 35.0 33.6 33.9  RDW 13.2 13.3 13.7 13.7    Chemistries   Recent Labs Lab 02/12/15 1555 02/12/15 2145 02/13/15 0555 02/14/15 0520 02/15/15 0620 02/16/15 0535  NA 120*  --  129* 132* 131* 133*  K 2.5*  --  2.6* 3.8 3.9 4.1  CL 79*  --  94* 102 101 105  CO2 32  --  26 24 23  21*  GLUCOSE 202*  --  138* 88 99 101*  BUN 29*  --  20 15 12 12   CREATININE 1.39*  --  0.89 0.82 0.72 0.74  CALCIUM 8.8*  --  8.4* 8.6* 8.7* 8.6*  MG  --  1.8  --   --   --   --   AST 199*  --  144* 122* 108* 84*  ALT 63  --  47 43 43 37  ALKPHOS 98  --  73 73 82 72  BILITOT 1.7*  --  1.6* 1.7* 1.4* 1.5*   ------------------------------------------------------------------------------------------------------------------ estimated creatinine clearance is 91.8 mL/min (by C-G formula based on Cr of 0.74). ------------------------------------------------------------------------------------------------------------------ No results for input(s): HGBA1C in the last 72 hours. ------------------------------------------------------------------------------------------------------------------ No  results for input(s): CHOL, HDL, LDLCALC, TRIG, CHOLHDL, LDLDIRECT in the last 72 hours. ------------------------------------------------------------------------------------------------------------------ No results for input(s): TSH, T4TOTAL, T3FREE, THYROIDAB in the last 72 hours.  Invalid input(s): FREET3 ------------------------------------------------------------------------------------------------------------------ No results for input(s): VITAMINB12, FOLATE, FERRITIN, TIBC, IRON, RETICCTPCT in the last 72 hours.  Coagulation profile No results for input(s): INR, PROTIME in the last 168 hours.  No results for input(s): DDIMER in the last 72 hours.  Cardiac Enzymes No results for input(s): CKMB, TROPONINI, MYOGLOBIN in the last 168 hours.  Invalid input(s): CK ------------------------------------------------------------------------------------------------------------------ Invalid input(s): POCBNP  Jaeden Westbay D.O. on 02/16/2015 at 10:51 AM  Between 7am to 7pm - Pager - [phone]  After 7pm go to [email or web address] - password [email or web address]  And look for the night coverage person covering for me after hours  [facility] Office  [phone]

## 2015-02-17 DIAGNOSIS — F1020 Alcohol dependence, uncomplicated: Secondary | ICD-10-CM

## 2015-02-17 DIAGNOSIS — F10959 Alcohol use, unspecified with alcohol-induced psychotic disorder, unspecified: Secondary | ICD-10-CM

## 2015-02-17 DIAGNOSIS — F10921 Alcohol use, unspecified with intoxication delirium: Secondary | ICD-10-CM

## 2015-02-17 MED ORDER — TRAZODONE HCL 50 MG PO TABS: 50.0000 mg | ORAL_TABLET | Freq: Every day | ORAL | Status: DC

## 2015-02-17 MED ORDER — ADULT MULTIVITAMIN W/MINERALS CH: 1.0000 | ORAL_TABLET | Freq: Every day | ORAL | Status: DC

## 2015-02-17 MED ORDER — KETOCONAZOLE 2 % EX CREA: TOPICAL_CREAM | Freq: Every day | CUTANEOUS | Status: DC

## 2015-02-17 NOTE — Progress Notes (Signed)
Patient given discharge instructions, and verbalized an understanding of all discharge instructions.  Patient agrees with discharge plan, and is being discharged in stable medical condition.  Patient given transportation via wheelchair. 

## 2015-02-17 NOTE — Progress Notes (Signed)
Spoke with pt's wife concerning HH, she selected [facility].  Referral given.

## 2015-02-17 NOTE — Discharge Summary (Addendum)
Physician Discharge Summary  [name] [id number] DOB: 07-Nov-1955 DOA: 02/12/2015  PCP: Woody Seller, MD  Admit date: 02/12/2015 Discharge date: 02/17/2015  Time spent: 45 minutes  Recommendations for Outpatient Follow-up:  Patient will be discharged to home with home health.  Patient will need to follow up with primary care provider within one week of discharge, repeat CBC and CMP.  Patient would benefit from Alcohol rehab. Patient should continue medications as prescribed.  Patient should follow a heart healthy diet.  Advised to abstain from alcohol use.  Discharge Diagnoses:  Due to metabolic encephalopathy Alcohol abuse with hallucinations and kind hyponatremia Hyperammonemia Acute kidney injury Elevated transaminases Hypokalemia Pancytopenia Liver cirrhosis Questionable fungal infection  Discharge Condition: Stable  Diet recommendation: Heart healthy  Filed Weights   02/13/15 0800 02/16/15 0515  Weight: 77.111 kg (170 lb) 75.524 kg (166 lb 8 oz)    History of present illness:  on 02/12/2015  [name] is a 59 y.o. male with a history of alcohol abuse, liver services, thrombocytopenia, who presented to the emergency department with complaints of confusion and behavior changes. Patient is accompanied by his wife. Patient is able to answer most questions however does deviate. States he's had "itchiness, increased fidgetiness"for the past day or so. Per his wife, he has had increased confusion and behavior changes. Patient has still been drinking alcohol however had some earlier this morning. Most times he tends to forget where he left his alcohol. He denies any recent illness, travel. Denies any chest pain, shortness of breath, changes in bowel patterns or urinary frequency. In the emergency department, patient was noted to have a sodium of 120, ammonia level was 36, potassium 2.5. Patient was given banana bag and started on potassium supplementation along  with CIWA protocol. [facility] called for admission.   Hospital Course:  Acute metabolic encephalopathy -Mentation improving -Patient appears calm this morning -Likely secondary to multifactorial causes including hyponatremia versus alcohol -Ammonia 24  -Currently afebrile, no leukocytosis -Denies any urinary symptoms or GI changes -CXR showed no active cardiopulmonary disease -PT and OT consulted for eval and treatment- recommended HH/24 hour supervision -Psychiatry consulted and recommended trazadone at night -CT head: no acute intracranial findings.  Alcohol Abuse with hallucinations -was placed on CIWA protocol -Patient calm this morning.  No active hallucinations.  Alert and oriented. -Continue multivitamin, folate, thiamine -Continues to have agitation- continue ativan and close monitoring -psych consulted and appreciated and recommended trazadone -Spoke with wife via phone.  Patient advised to abstain from alcohol use.  Hyponatremia -Improving, Na 133 (was 120 at admission) -Repeat BMP in one week -Possibly due to alcoholism -TSH 1.055  Hyperammonemia -Secondary to liver disease -Ammonia improved 24  Acute kidney injury -creatinine 1.39 upon admission, currently 0.74 -Possibly secondary to diuretics including Lasix as well as prolactin however patient does not take any medications in several days per wife  Elevated transaminases -Improving, Secondary to alcohol abuse  Hypokalemia -Possibly secondary to diuretic use -K 4.1 today  Pancytopenia -Hb 8.4, plateles 64 (drop likely dilutional) -WBC improving 3.6 -Likely secondary to liver disease -repeat CBC in one week  Liver cirrhosis  -Appears to be stable at this time -Secondary to alcohol abuse  ?Fungal infection -Continue ketoconazole cream -Appears to have improved  Procedures  None  Consults  Psychiatry   Discharge Exam: Filed Vitals:   02/17/15 0524 02/17/15 0600  BP: 120/70 120/70  Pulse: 87    Temp: 98 F (36.7 C)   Resp: 20  Exam  General: Well developed, well nourished, NAD  HEENT: NCAT, mucous membranes moist.   Cardiovascular: S1 S2 auscultated, RRR, no murmurs  Respiratory: Clear to auscultation bilaterally  Abdomen: Soft, nontender, nondistended, + bowel sounds, umbilical hernia  Extremities: warm dry without cyanosis clubbing, no edema  Neuro: AAOx3 (person, place, time of year), nonfocal  Psych: calm, appropriate  Discharge Instructions      Discharge Instructions    Discharge instructions    Complete by:  As directed   Patient will be discharged to home with home health.  Patient will need to follow up with primary care provider within one week of discharge, repeat CBC and CMP.  Patient would benefit from Alcohol rehab. Patient should continue medications as prescribed.  Patient should follow a heart healthy diet.  Advised to abstain from alcohol use.            Medication List    TAKE these medications        acetaminophen 325 MG tablet  Commonly known as:  TYLENOL  Take 2 tablets (650 mg total) by mouth every 8 (eight) hours as needed for mild pain, moderate pain, fever or headache (or Fever >/= 101).     ARTIFICIAL TEARS OP  Place 2 drops into the right eye daily as needed (irritation). For redness/itchy     folic acid 1 MG tablet  Commonly known as:  FOLVITE  Take 1 tablet (1 mg total) by mouth daily.     furosemide 80 MG tablet  Commonly known as:  LASIX  Take 1 tablet (80 mg total) by mouth daily.     ketoconazole 2 % cream  Commonly known as:  NIZORAL  Apply topically daily. Apply to affect area.  Use for one week.     LORazepam 1 MG tablet  Commonly known as:  ATIVAN  Take 0.5-1 mg by mouth every 6 (six) hours as needed for anxiety.     multivitamin with minerals Tabs tablet  Take 1 tablet by mouth daily.     naproxen sodium 220 MG tablet  Commonly known as:  ANAPROX  Take 440 mg by mouth daily as needed (pain).      spironolactone 100 MG tablet  Commonly known as:  ALDACTONE  Take 1 tablet (100 mg total) by mouth daily.     tamsulosin 0.4 MG Caps capsule  Commonly known as:  FLOMAX  Take 0.4 mg by mouth daily.     thiamine 100 MG tablet  Take 1 tablet (100 mg total) by mouth daily.     traZODone 50 MG tablet  Commonly known as:  DESYREL  Take 1 tablet (50 mg total) by mouth at bedtime.       Allergies  Allergen Reactions  . Aspirin Other (See Comments)    Has upset stomach in high doses only. Took 4 aspirin with EMS, no reaction.      The results of significant diagnostics from this hospitalization (including imaging, microbiology, ancillary and laboratory) are listed below for reference.    Significant Diagnostic Studies: Dg Chest 2 View  02/12/2015  CLINICAL DATA:  Shortness of breath.  Evaluate for pneumonia. EXAM: CHEST  2 VIEW COMPARISON:  10/10/2014 FINDINGS: There is no focal parenchymal opacity. There is no pleural effusion or pneumothorax. The heart and mediastinal contours are unremarkable. The osseous structures are unremarkable. Prior lower anterior cervical fusion. IMPRESSION: No active cardiopulmonary disease. Electronically Signed   By: Kathreen Devoid   On: 02/12/2015 16:06  Ct Head Wo Contrast  02/16/2015  CLINICAL DATA:  Acute encephalopathy. History of anemia, cirrhosis and thrombocytopenia. EXAM: CT HEAD WITHOUT CONTRAST TECHNIQUE: Contiguous axial images were obtained from the base of the skull through the vertex without intravenous contrast. COMPARISON:  Limited correlation made with cervical spine MRI 05/05/2014. FINDINGS: Study is mildly motion degraded. Examination was repeated. There is no evidence of acute intracranial hemorrhage, mass lesion, brain edema or extra-axial fluid collection. The ventricles and subarachnoid spaces are are diffusely prominent, consistent with mild atrophy. There is no CT evidence of acute cortical infarction. There are probable mild small  vessel ischemic changes in the basal ganglia bilaterally. Intracranial vascular calcifications noted. The visualized paranasal sinuses, mastoid air cells and middle ears are clear. The calvarium is intact. IMPRESSION: No acute intracranial findings. Mild atrophy and probable chronic small vessel ischemic changes in the basal ganglia. Electronically Signed   By: Richardean Sale M.D.   On: 02/16/2015 11:04   Dg Chest Port 1 View  02/14/2015  CLINICAL DATA:  Current SOB. No chest pains. Not taking HTN meds. Not diabetic. Nonsmoker. ETOH abuse. Liver cirrhosis. EXAM: PORTABLE CHEST 1 VIEW COMPARISON:  02/12/2015 FINDINGS: The heart size and mediastinal contours are within normal limits. Both lungs are clear. The visualized skeletal structures are unremarkable. IMPRESSION: No active disease. Electronically Signed   By: Nolon Nations M.D.   On: 02/14/2015 09:31    Microbiology: No results found for this or any previous visit (from the past 240 hour(s)).   Labs: Basic Metabolic Panel:  Recent Labs Lab 02/12/15 1555 02/12/15 2145 02/13/15 0555 02/14/15 0520 02/15/15 0620 02/16/15 0535  NA 120*  --  129* 132* 131* 133*  K 2.5*  --  2.6* 3.8 3.9 4.1  CL 79*  --  94* 102 101 105  CO2 32  --  26 24 23  21*  GLUCOSE 202*  --  138* 88 99 101*  BUN 29*  --  20 15 12 12   CREATININE 1.39*  --  0.89 0.82 0.72 0.74  CALCIUM 8.8*  --  8.4* 8.6* 8.7* 8.6*  MG  --  1.8  --   --   --   --    Liver Function Tests:  Recent Labs Lab 02/12/15 1555 02/13/15 0555 02/14/15 0520 02/15/15 0620 02/16/15 0535  AST 199* 144* 122* 108* 84*  ALT 63 47 43 43 37  ALKPHOS 98 73 73 82 72  BILITOT 1.7* 1.6* 1.7* 1.4* 1.5*  PROT 7.6 6.0* 6.2* 6.6 6.4*  ALBUMIN 3.4* 2.6* 2.7* 2.9* 2.7*   No results for input(s): LIPASE, AMYLASE in the last 168 hours.  Recent Labs Lab 02/12/15 1556 02/14/15 0520 02/15/15 0620 02/16/15 0535  AMMONIA 36* 51* 38* 24   CBC:  Recent Labs Lab 02/12/15 1555 02/13/15 0555  02/14/15 0520 02/15/15 0620  WBC 3.6* 2.6* 2.5* 3.6*  HGB 9.2* 8.4* 8.1* 8.4*  HCT 26.3* 24.0* 24.1* 24.8*  MCV 95.6 97.2 100.4* 99.6  PLT 77* 56* 53* 64*   Cardiac Enzymes: No results for input(s): CKTOTAL, CKMB, CKMBINDEX, TROPONINI in the last 168 hours. BNP: BNP (last 3 results)  Recent Labs  10/10/14 1702  BNP 120.9*    ProBNP (last 3 results) No results for input(s): PROBNP in the last 8760 hours.  CBG: No results for input(s): GLUCAP in the last 168 hours.     SignedCristal Ford  [facility] 02/17/2015, 9:32 AM  Addendum: Received call from wife after she took the  patient home stating that he was still having hallucinations and now asking to smoke.  He has not asked for alcohol.  I stated to hear that upon examination this morning, the patient was calm and able to answer all of my questions appropriately.  He was able to carry on a meaningful conversation. Safety sitter was also present and stated that he was able to bath and have breakfast this morning and remained calm, alert, and oriented.  I spoke with RN, patient had no issues or events overnight.  Explained to the wife that patient's mental status may wax and wane.  He should attend an alcohol rehab program and have close follow up with his primary care physician.  He has ativan at home which was already prescribed to him.  Patient was also discharged with trazodone as recommended by the psychiatrist.  Patient also discharged with home health services.    Time spent: 15 minutes  Hicks Feick D.O. [facility] Pager [phone]  If 7PM-7AM, please contact night-coverage [email or web address] Password [facility] 02/17/2015, 12:38 PM

## 2015-02-21 ENCOUNTER — Other Ambulatory Visit (HOSPITAL_COMMUNITY)
Admission: RE | Admit: 2015-02-21 | Discharge: 2015-02-21 | Disposition: A | Discharge status: Home / Self Care | Payer: 59 | Source: Other Acute Inpatient Hospital | Attending: Family Medicine | Admitting: Family Medicine

## 2015-02-21 DIAGNOSIS — N390 Urinary tract infection, site not specified: Secondary | ICD-10-CM | POA: Diagnosis present

## 2015-02-21 LAB — URINALYSIS, ROUTINE W REFLEX MICROSCOPIC
GLUCOSE, UA: NEGATIVE mg/dL
HGB URINE DIPSTICK: NEGATIVE
Ketones, ur: NEGATIVE mg/dL
Leukocytes, UA: NEGATIVE
Nitrite: NEGATIVE
PH: 5.5 (ref 5.0–8.0)
Protein, ur: NEGATIVE mg/dL
SPECIFIC GRAVITY, URINE: 1.025 (ref 1.005–1.030)

## 2015-02-23 LAB — URINE CULTURE: CULTURE: NO GROWTH

## 2015-02-25 ENCOUNTER — Other Ambulatory Visit (HOSPITAL_COMMUNITY)
Admission: AD | Admit: 2015-02-25 | Discharge: 2015-02-25 | Disposition: A | Discharge status: Home / Self Care | Payer: 59 | Source: Other Acute Inpatient Hospital | Attending: Family Medicine | Admitting: Family Medicine

## 2015-02-25 DIAGNOSIS — E876 Hypokalemia: Secondary | ICD-10-CM | POA: Insufficient documentation

## 2015-02-25 LAB — COMPREHENSIVE METABOLIC PANEL
ALBUMIN: 3 g/dL — AB (ref 3.5–5.0)
ALK PHOS: 66 U/L (ref 38–126)
ALT: 37 U/L (ref 17–63)
ANION GAP: 10 (ref 5–15)
AST: 70 U/L — ABNORMAL HIGH (ref 15–41)
BILIRUBIN TOTAL: 1.1 mg/dL (ref 0.3–1.2)
BUN: 22 mg/dL — ABNORMAL HIGH (ref 6–20)
CALCIUM: 9.2 mg/dL (ref 8.9–10.3)
CO2: 25 mmol/L (ref 22–32)
Chloride: 98 mmol/L — ABNORMAL LOW (ref 101–111)
Creatinine, Ser: 0.89 mg/dL (ref 0.61–1.24)
GFR calc non Af Amer: >60 mL/min (ref >60)
GLUCOSE: 103 mg/dL — AB (ref 65–99)
POTASSIUM: 3.4 mmol/L — AB (ref 3.5–5.1)
SODIUM: 133 mmol/L — AB (ref 135–145)
TOTAL PROTEIN: 7.3 g/dL (ref 6.5–8.1)

## 2015-02-25 LAB — CBC WITH DIFFERENTIAL/PLATELET
BASOS PCT: 0 %
Basophils Absolute: 0 10*3/uL (ref 0.0–0.1)
Eosinophils Absolute: 0.1 10*3/uL (ref 0.0–0.7)
Eosinophils Relative: 3 %
HCT: 27.2 % — ABNORMAL LOW (ref 39.0–52.0)
Hemoglobin: 9.3 g/dL — ABNORMAL LOW (ref 13.0–17.0)
Lymphocytes Relative: 20 %
Lymphs Abs: 0.9 10*3/uL (ref 0.7–4.0)
MCH: 33.3 pg (ref 26.0–34.0)
MCHC: 34.2 g/dL (ref 30.0–36.0)
MCV: 97.5 fL (ref 78.0–100.0)
Monocytes Absolute: 0.7 10*3/uL (ref 0.1–1.0)
Monocytes Relative: 16 %
NEUTROS ABS: 2.8 10*3/uL (ref 1.7–7.7)
NEUTROS PCT: 61 %
Platelets: 151 10*3/uL (ref 150–400)
RBC: 2.79 MIL/uL — ABNORMAL LOW (ref 4.22–5.81)
RDW: 13.4 % (ref 11.5–15.5)
WBC: 4.5 10*3/uL (ref 4.0–10.5)

## 2015-03-14 ENCOUNTER — Other Ambulatory Visit (HOSPITAL_COMMUNITY): Payer: Self-pay | Admitting: Family Medicine

## 2015-03-14 DIAGNOSIS — R4182 Altered mental status, unspecified: Secondary | ICD-10-CM

## 2015-03-20 ENCOUNTER — Ambulatory Visit (HOSPITAL_COMMUNITY)
Admission: RE | Admit: 2015-03-20 | Discharge: 2015-03-20 | Disposition: A | Discharge status: Home / Self Care | Payer: 59 | Source: Ambulatory Visit | Attending: Family Medicine | Admitting: Family Medicine

## 2015-03-20 DIAGNOSIS — G319 Degenerative disease of nervous system, unspecified: Secondary | ICD-10-CM | POA: Insufficient documentation

## 2015-03-20 DIAGNOSIS — R4182 Altered mental status, unspecified: Secondary | ICD-10-CM | POA: Insufficient documentation

## 2015-03-20 DIAGNOSIS — R410 Disorientation, unspecified: Secondary | ICD-10-CM | POA: Diagnosis not present

## 2015-03-27 ENCOUNTER — Emergency Department (HOSPITAL_COMMUNITY): Payer: 59

## 2015-03-27 ENCOUNTER — Encounter (HOSPITAL_COMMUNITY): Payer: Self-pay | Admitting: Emergency Medicine

## 2015-03-27 ENCOUNTER — Inpatient Hospital Stay (HOSPITAL_COMMUNITY)
Admission: EM | Admit: 2015-03-27 | Discharge: 2015-04-03 | DRG: 641 | Disposition: A | Discharge status: Skilled Nursing Facility | Payer: 59 | Attending: Internal Medicine | Admitting: Internal Medicine

## 2015-03-27 DIAGNOSIS — F1910 Other psychoactive substance abuse, uncomplicated: Secondary | ICD-10-CM | POA: Diagnosis not present

## 2015-03-27 DIAGNOSIS — D649 Anemia, unspecified: Secondary | ICD-10-CM | POA: Diagnosis present

## 2015-03-27 DIAGNOSIS — Z781 Physical restraint status: Secondary | ICD-10-CM

## 2015-03-27 DIAGNOSIS — F419 Anxiety disorder, unspecified: Secondary | ICD-10-CM | POA: Diagnosis present

## 2015-03-27 DIAGNOSIS — R410 Disorientation, unspecified: Secondary | ICD-10-CM

## 2015-03-27 DIAGNOSIS — K7460 Unspecified cirrhosis of liver: Secondary | ICD-10-CM | POA: Diagnosis present

## 2015-03-27 DIAGNOSIS — D638 Anemia in other chronic diseases classified elsewhere: Secondary | ICD-10-CM | POA: Diagnosis present

## 2015-03-27 DIAGNOSIS — D731 Hypersplenism: Secondary | ICD-10-CM | POA: Diagnosis present

## 2015-03-27 DIAGNOSIS — Z720 Tobacco use: Secondary | ICD-10-CM

## 2015-03-27 DIAGNOSIS — R269 Unspecified abnormalities of gait and mobility: Secondary | ICD-10-CM | POA: Diagnosis present

## 2015-03-27 DIAGNOSIS — R4689 Other symptoms and signs involving appearance and behavior: Secondary | ICD-10-CM

## 2015-03-27 DIAGNOSIS — D6959 Other secondary thrombocytopenia: Secondary | ICD-10-CM | POA: Diagnosis present

## 2015-03-27 DIAGNOSIS — F10231 Alcohol dependence with withdrawal delirium: Secondary | ICD-10-CM | POA: Diagnosis present

## 2015-03-27 DIAGNOSIS — F1010 Alcohol abuse, uncomplicated: Secondary | ICD-10-CM | POA: Diagnosis present

## 2015-03-27 DIAGNOSIS — K219 Gastro-esophageal reflux disease without esophagitis: Secondary | ICD-10-CM | POA: Diagnosis present

## 2015-03-27 DIAGNOSIS — F1020 Alcohol dependence, uncomplicated: Secondary | ICD-10-CM | POA: Diagnosis present

## 2015-03-27 DIAGNOSIS — G9340 Encephalopathy, unspecified: Secondary | ICD-10-CM | POA: Diagnosis not present

## 2015-03-27 DIAGNOSIS — K7031 Alcoholic cirrhosis of liver with ascites: Secondary | ICD-10-CM | POA: Diagnosis present

## 2015-03-27 DIAGNOSIS — D696 Thrombocytopenia, unspecified: Secondary | ICD-10-CM | POA: Diagnosis present

## 2015-03-27 DIAGNOSIS — Z008 Encounter for other general examination: Secondary | ICD-10-CM

## 2015-03-27 DIAGNOSIS — E512 Wernicke's encephalopathy: Secondary | ICD-10-CM | POA: Diagnosis not present

## 2015-03-27 DIAGNOSIS — H532 Diplopia: Secondary | ICD-10-CM | POA: Diagnosis present

## 2015-03-27 DIAGNOSIS — R4182 Altered mental status, unspecified: Secondary | ICD-10-CM | POA: Diagnosis present

## 2015-03-27 DIAGNOSIS — D539 Nutritional anemia, unspecified: Secondary | ICD-10-CM | POA: Diagnosis present

## 2015-03-27 DIAGNOSIS — N400 Enlarged prostate without lower urinary tract symptoms: Secondary | ICD-10-CM | POA: Diagnosis present

## 2015-03-27 LAB — CBC WITH DIFFERENTIAL/PLATELET
BASOS PCT: 1 %
Basophils Absolute: 0 10*3/uL (ref 0.0–0.1)
EOS ABS: 0.1 10*3/uL (ref 0.0–0.7)
Eosinophils Relative: 2 %
HCT: 25.7 % — ABNORMAL LOW (ref 39.0–52.0)
HEMOGLOBIN: 8.7 g/dL — AB (ref 13.0–17.0)
Lymphocytes Relative: 34 %
Lymphs Abs: 1.4 10*3/uL (ref 0.7–4.0)
MCH: 32.6 pg (ref 26.0–34.0)
MCHC: 33.9 g/dL (ref 30.0–36.0)
MCV: 96.3 fL (ref 78.0–100.0)
Monocytes Absolute: 0.5 10*3/uL (ref 0.1–1.0)
Monocytes Relative: 11 %
NEUTROS PCT: 52 %
Neutro Abs: 2.1 10*3/uL (ref 1.7–7.7)
Platelets: 110 10*3/uL — ABNORMAL LOW (ref 150–400)
RBC: 2.67 MIL/uL — ABNORMAL LOW (ref 4.22–5.81)
RDW: 15.7 % — ABNORMAL HIGH (ref 11.5–15.5)
WBC: 4.1 10*3/uL (ref 4.0–10.5)

## 2015-03-27 LAB — URINALYSIS, ROUTINE W REFLEX MICROSCOPIC
Bilirubin Urine: NEGATIVE
GLUCOSE, UA: NEGATIVE mg/dL
HGB URINE DIPSTICK: NEGATIVE
Ketones, ur: NEGATIVE mg/dL
Leukocytes, UA: NEGATIVE
Nitrite: NEGATIVE
Protein, ur: NEGATIVE mg/dL
SPECIFIC GRAVITY, URINE: 1.026 (ref 1.005–1.030)
pH: 7 (ref 5.0–8.0)

## 2015-03-27 LAB — COMPREHENSIVE METABOLIC PANEL
ALBUMIN: 3.3 g/dL — AB (ref 3.5–5.0)
ALT: 14 U/L — ABNORMAL LOW (ref 17–63)
ANION GAP: 10 (ref 5–15)
AST: 29 U/L (ref 15–41)
Alkaline Phosphatase: 76 U/L (ref 38–126)
BILIRUBIN TOTAL: 0.8 mg/dL (ref 0.3–1.2)
BUN: 17 mg/dL (ref 6–20)
CHLORIDE: 106 mmol/L (ref 101–111)
CO2: 25 mmol/L (ref 22–32)
Calcium: 9.5 mg/dL (ref 8.9–10.3)
Creatinine, Ser: 0.93 mg/dL (ref 0.61–1.24)
GFR calc Af Amer: >60 mL/min (ref >60)
GFR calc non Af Amer: >60 mL/min (ref >60)
GLUCOSE: 89 mg/dL (ref 65–99)
POTASSIUM: 4.1 mmol/L (ref 3.5–5.1)
SODIUM: 141 mmol/L (ref 135–145)
TOTAL PROTEIN: 7.5 g/dL (ref 6.5–8.1)

## 2015-03-27 LAB — AMYLASE: AMYLASE: 86 U/L (ref 28–100)

## 2015-03-27 LAB — LIPASE, BLOOD: Lipase: 15 U/L (ref 11–51)

## 2015-03-27 LAB — RAPID URINE DRUG SCREEN, HOSP PERFORMED
AMPHETAMINES: NOT DETECTED
Barbiturates: NOT DETECTED
Benzodiazepines: POSITIVE — AB
COCAINE: NOT DETECTED
OPIATES: NOT DETECTED
TETRAHYDROCANNABINOL: NOT DETECTED

## 2015-03-27 LAB — ETHANOL: Alcohol, Ethyl (B): <5 mg/dL (ref <5)

## 2015-03-27 LAB — ACETAMINOPHEN LEVEL

## 2015-03-27 LAB — SALICYLATE LEVEL: Salicylate Lvl: <4 mg/dL (ref 2.8–30.0)

## 2015-03-27 LAB — AMMONIA: Ammonia: 39 umol/L — ABNORMAL HIGH (ref 9–35)

## 2015-03-27 LAB — CBG MONITORING, ED: Glucose-Capillary: 91 mg/dL (ref 65–99)

## 2015-03-27 MED ORDER — LORAZEPAM 1 MG PO TABS
0.0000 mg | ORAL_TABLET | Freq: Two times a day (BID) | ORAL | Status: DC
  Administered 2015-03-28: 2 mg via ORAL
  Administered 2015-03-30 – 2015-03-31 (×3): 1 mg via ORAL
  Filled 2015-03-27 (×2): qty 1
  Filled 2015-03-27: qty 2
  Filled 2015-03-27 (×2): qty 1

## 2015-03-27 MED ORDER — NICOTINE 21 MG/24HR TD PT24
21.0000 mg | MEDICATED_PATCH | Freq: Every day | TRANSDERMAL | Status: DC
  Administered 2015-03-27 – 2015-04-03 (×6): 21 mg via TRANSDERMAL
  Filled 2015-03-27 (×7): qty 1

## 2015-03-27 MED ORDER — LORAZEPAM 2 MG/ML IJ SOLN: 0.0000 mg | Freq: Two times a day (BID) | INTRAMUSCULAR | Status: DC

## 2015-03-27 MED ORDER — ONDANSETRON HCL 4 MG PO TABS: 4.0000 mg | ORAL_TABLET | Freq: Three times a day (TID) | ORAL | Status: DC | PRN

## 2015-03-27 MED ORDER — SPIRONOLACTONE 25 MG PO TABS
100.0000 mg | ORAL_TABLET | Freq: Every day | ORAL | Status: DC
  Administered 2015-03-28 – 2015-04-03 (×7): 100 mg via ORAL
  Filled 2015-03-27: qty 1
  Filled 2015-03-27 (×6): qty 4

## 2015-03-27 MED ORDER — TRAZODONE HCL 50 MG PO TABS
50.0000 mg | ORAL_TABLET | Freq: Every day | ORAL | Status: DC
  Administered 2015-03-27 – 2015-04-02 (×7): 50 mg via ORAL
  Filled 2015-03-27 (×7): qty 1

## 2015-03-27 MED ORDER — DIAZEPAM 5 MG PO TABS: 5.0000 mg | ORAL_TABLET | Freq: Three times a day (TID) | ORAL | Status: DC | PRN

## 2015-03-27 MED ORDER — LORAZEPAM 1 MG PO TABS
0.0000 mg | ORAL_TABLET | Freq: Four times a day (QID) | ORAL | Status: DC
  Administered 2015-03-27 – 2015-03-28 (×3): 1 mg via ORAL
  Administered 2015-03-29: 2 mg via ORAL
  Filled 2015-03-27 (×2): qty 1
  Filled 2015-03-27: qty 2

## 2015-03-27 MED ORDER — LORAZEPAM 1 MG PO TABS
1.0000 mg | ORAL_TABLET | Freq: Three times a day (TID) | ORAL | Status: DC | PRN
  Administered 2015-03-29 – 2015-03-31 (×4): 1 mg via ORAL
  Filled 2015-03-27 (×4): qty 1

## 2015-03-27 MED ORDER — TAMSULOSIN HCL 0.4 MG PO CAPS
0.4000 mg | ORAL_CAPSULE | Freq: Every day | ORAL | Status: DC
  Administered 2015-03-27 – 2015-04-03 (×8): 0.4 mg via ORAL
  Filled 2015-03-27 (×8): qty 1

## 2015-03-27 MED ORDER — ACETAMINOPHEN 325 MG PO TABS: 650.0000 mg | ORAL_TABLET | ORAL | Status: DC | PRN

## 2015-03-27 MED ORDER — VITAMIN B-1 100 MG PO TABS
100.0000 mg | ORAL_TABLET | Freq: Every day | ORAL | Status: DC
  Administered 2015-03-27 – 2015-03-28 (×2): 100 mg via ORAL
  Filled 2015-03-27 (×2): qty 1

## 2015-03-27 MED ORDER — QUETIAPINE FUMARATE 50 MG PO TABS
50.0000 mg | ORAL_TABLET | Freq: Three times a day (TID) | ORAL | Status: DC
  Administered 2015-03-27: 100 mg via ORAL
  Administered 2015-03-28: 50 mg via ORAL
  Filled 2015-03-27: qty 1
  Filled 2015-03-27: qty 2

## 2015-03-27 MED ORDER — ALUM & MAG HYDROXIDE-SIMETH 200-200-20 MG/5ML PO SUSP: 30.0000 mL | ORAL | Status: DC | PRN

## 2015-03-27 MED ORDER — HALOPERIDOL LACTATE 5 MG/ML IJ SOLN: 5.0000 mg | Freq: Once | INTRAMUSCULAR | Status: DC | PRN

## 2015-03-27 MED ORDER — VITAMIN B-1 100 MG PO TABS
100.0000 mg | ORAL_TABLET | Freq: Every day | ORAL | Status: DC
  Administered 2015-03-27: 100 mg via ORAL
  Filled 2015-03-27 (×2): qty 1

## 2015-03-27 MED ORDER — LORAZEPAM 2 MG/ML IJ SOLN
0.0000 mg | Freq: Four times a day (QID) | INTRAMUSCULAR | Status: DC
  Administered 2015-03-29: 2 mg via INTRAVENOUS
  Filled 2015-03-27: qty 1

## 2015-03-27 MED ORDER — ZOLPIDEM TARTRATE 5 MG PO TABS
5.0000 mg | ORAL_TABLET | Freq: Every evening | ORAL | Status: DC | PRN
  Administered 2015-03-27: 5 mg via ORAL
  Filled 2015-03-27: qty 1

## 2015-03-27 MED ORDER — LACTULOSE 10 GM/15ML PO SOLN
20.0000 g | Freq: Two times a day (BID) | ORAL | Status: DC
  Administered 2015-03-28 – 2015-03-31 (×7): 20 g via ORAL
  Filled 2015-03-27 (×9): qty 30

## 2015-03-27 MED ORDER — THIAMINE HCL 100 MG/ML IJ SOLN: 100.0000 mg | Freq: Every day | INTRAMUSCULAR | Status: DC

## 2015-03-27 NOTE — ED Notes (Signed)
Patient urinated in floor.  Clean scrubs put on.  Patient states he didn't know where to go after being directed to bathroom so he decided to urinate in floor.

## 2015-03-27 NOTE — BH Assessment (Signed)
Assessment completed. Consulted Arlester Marker, NP who recommended that pt be evaluated by psychiatry in the morning. Informed Arlean Hopping, PA-C of disposition.

## 2015-03-27 NOTE — ED Provider Notes (Signed)
CSN: [id number]     Arrival date & time 03/27/15  1358 History   First MD Initiated Contact with Patient 03/27/15 1533     Chief Complaint  Patient presents with  . Medical Clearance     (Consider location/radiation/quality/duration/timing/severity/associated sxs/prior Treatment) HPI   [name] is a 60 y.o. male, with a history of substance abuse, pancreatitis, alcoholism, anemia, cirrhosis of liver, and anxiety, presenting to the ED with reports of patient "not acting right" over the last month. Patient's wife, [name], is at the bedside and provides most of the history due to patient's confusion. [name] states that the patient was completely normal when he was admitted to [facility] at the beginning of December but then began having odd behavior and confusion after his discharge on December 5. Pt was admitted because he wasn't sleeping, eating or drinking well. Wife states patient used to drink at least a 12 pack of beer a day prior to his recent hospitalization, but hasn't drunk any alcohol since. Patient dresses in odd layers with a shirt over another shirt and a robe over that and another shirt over that. Pt reportedly stands in the kitchen, wanders around and stares out the window. Patient is not sleeping well. Patient is apparently "seeing puddles of water and blood on the floor." PCP is Kathryne Eriksson in [place]. Denies any medication changes. Denies history of mental illness. MRI was performed last week and found no acute abnormalities. Wife denies any evidence of self harm, but will occasionally swing his fists at family members. Wife endorses multiple falls since he came home from the hospital, but no changes to the patient's mental status or behavioral after these falls.   Past Medical History  Diagnosis Date  . GERD (gastroesophageal reflux disease)   . 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 Transfusion history 08-16-12    '99  . Pancreatitis 08-16-12    at present  weakness,fatiques easily  . Foreign body 08-16-12    nose"BB" pellet since age 48  . History of ETOH abuse 08-16-12    Heavy alcohol use daily  . Anemia   . Anxiety   . Clotting disorder ([place])   . Substance abuse   . Cirrhosis [facility])    Past Surgical History  Procedure Laterality Date  . Neck surgery      fusion of neck-titanium hardware inplaced  . Esophagogastroduodenoscopy    . Colonoscopy    . Eus N/A 08/18/2012    Procedure: UPPER ENDOSCOPIC ULTRASOUND (EUS) LINEAR;  Surgeon: Beryle Beams, MD;  Location: [facility] ENDOSCOPY;  Service: Endoscopy;  Laterality: N/A;  . Neck surgery  [id number]   Family History  Problem Relation Age of Onset  . Hypertension Father   . Cancer Mother   . Stroke Mother    Social History  Substance Use Topics  . Smoking status: Former Smoker    Types: Cigarettes    Quit date: 08/16/2005  . Smokeless tobacco: Never Used  . Alcohol Use: 50.4 oz/week    84 Cans of beer per week     Comment: 12 cans beer per day    Review of Systems  Unable to perform ROS: Mental status change  Psychiatric/Behavioral: Positive for behavioral problems (Reported by family), confusion (Reported by family) and sleep disturbance (Reported by family).      Allergies  Aspirin  Home Medications   Prior to Admission medications   Medication Sig Start Date End Date Taking? Authorizing Provider  acetaminophen (TYLENOL) 325 MG  tablet Take 2 tablets (650 mg total) by mouth every 8 (eight) hours as needed for mild pain, moderate pain, fever or headache (or Fever >/= 101). 08/05/14  Yes Modena Jansky, MD  diazepam (VALIUM) 10 MG tablet Take 5-10 mg by mouth 3 (three) times daily as needed for anxiety.   Yes Historical Provider, MD  folic acid (FOLVITE) A999333 MCG tablet Take 400 mcg by mouth daily.   Yes Historical Provider, MD  lactulose (CHRONULAC) 10 GM/15ML solution Take 20 g by mouth 2 (two) times daily.   Yes Historical Provider, MD  naproxen sodium (ANAPROX) 220 MG tablet  Take 440 mg by mouth daily as needed (pain).   Yes Historical Provider, MD  nystatin (MYCOSTATIN) 100000 UNIT/ML suspension Take 5 mLs by mouth 4 (four) times daily. Reported on 03/27/2015   Yes Historical Provider, MD  QUEtiapine (SEROQUEL) 25 MG tablet Take 50-100 mg by mouth 3 (three) times daily. Take 50mg  twice daily and 100mg  at bedtime 03/27/15  Yes Historical Provider, MD  spironolactone (ALDACTONE) 100 MG tablet Take 1 tablet (100 mg total) by mouth daily. Patient taking differently: Take 50 mg by mouth daily.  10/17/14  Yes Charlynne Cousins, MD  tamsulosin (FLOMAX) 0.4 MG CAPS capsule Take 0.4 mg by mouth daily.   Yes Historical Provider, MD  thiamine 100 MG tablet Take 1 tablet (100 mg total) by mouth daily. 08/05/14  Yes Modena Jansky, MD  traZODone (DESYREL) 50 MG tablet Take 1 tablet (50 mg total) by mouth at bedtime. Patient taking differently: Take 100 mg by mouth at bedtime as needed for sleep.  02/17/15  Yes Maryann Mikhail, DO  ketoconazole (NIZORAL) 2 % cream Apply topically daily. Apply to affect area.  Use for one week. 02/17/15   Maryann Mikhail, DO  Multiple Vitamin (MULTIVITAMIN WITH MINERALS) TABS tablet Take 1 tablet by mouth daily. 02/17/15   Maryann Mikhail, DO   BP 102/61 mmHg  Pulse 88  Temp(Src) 98.2 F (36.8 C) (Oral)  Resp 16  SpO2 97% Physical Exam  Constitutional: He appears well-developed and well-nourished. No distress.  HENT:  Head: Normocephalic and atraumatic.  Eyes: Conjunctivae and EOM are normal. Pupils are equal, round, and reactive to light.  Neck: Normal range of motion. Neck supple.  Cardiovascular: Normal rate, regular rhythm, normal heart sounds and intact distal pulses.   Pulmonary/Chest: Effort normal and breath sounds normal. No respiratory distress.  Abdominal: Soft. Bowel sounds are normal.  Musculoskeletal: He exhibits no edema or tenderness.  Full ROM in all extremities and spine. No paraspinal tenderness.   Lymphadenopathy:    He  has no cervical adenopathy.  Neurological: He is alert. He has normal reflexes.  Pt is oriented to self only. No sensory deficits. Strength 5/5 in all extremities. No gait disturbance. Coordination intact. Cranial nerves III-XII grossly intact. Facial droop on the left that wife states has been there for a month.    Skin: Skin is warm and dry. He is not diaphoretic.  Psychiatric:  Pt noted to be restless. Gets up from the chair and walks around the room, rearranging furniture and mumbling to himself.   Nursing note and vitals reviewed.   ED Course  Procedures (including critical care time) Labs Review Labs Reviewed  COMPREHENSIVE METABOLIC PANEL - Abnormal; Notable for the following:    Albumin 3.3 (*)    ALT 14 (*)    All other components within normal limits  CBC WITH DIFFERENTIAL/PLATELET - Abnormal; Notable for the following:  RBC 2.67 (*)    Hemoglobin 8.7 (*)    HCT 25.7 (*)    RDW 15.7 (*)    Platelets 110 (*)    All other components within normal limits  URINE RAPID DRUG SCREEN, HOSP PERFORMED - Abnormal; Notable for the following:    Benzodiazepines POSITIVE (*)    All other components within normal limits  ACETAMINOPHEN LEVEL - Abnormal; Notable for the following:    Acetaminophen (Tylenol), Serum <10 (*)    All other components within normal limits  URINALYSIS, ROUTINE W REFLEX MICROSCOPIC (NOT AT [facility]) - Abnormal; Notable for the following:    APPearance CLOUDY (*)    All other components within normal limits  AMMONIA - Abnormal; Notable for the following:    Ammonia 39 (*)    All other components within normal limits  ETHANOL  SALICYLATE LEVEL  LIPASE, BLOOD  AMYLASE  CBG MONITORING, ED    Imaging Review Dg Chest 2 View  03/27/2015  CLINICAL DATA:  Confusion, abnormal behavior and disorientation, medical clearance, history cirrhosis, smoking EXAM: CHEST  2 VIEW COMPARISON:  02/14/2015 FINDINGS: Minimal enlargement of cardiac silhouette. Mediastinal contours  and pulmonary vascularity normal. Bibasilar atelectasis. Lungs otherwise clear. No infiltrate, pleural effusion or pneumothorax. Prior cervicothoracic fusion. No acute bony findings. IMPRESSION: Minimal enlargement of cardiac silhouette and bibasilar atelectasis. Electronically Signed   By: Lavonia Dana M.D.   On: 03/27/2015 16:23   I have personally reviewed and evaluated these images and lab results as part of my medical decision-making.   EKG Interpretation None      MDM   Final diagnoses:  Behavioral change  Confusion  Encounter for psychological evaluation    Xavien Hoene presents with abnormal behavior and confusion for the last month.  Findings and plan of care discussed with Davonna Belling, MD.  This patient was medically worked up and no acute abnormalities were found. With no physical explanation found for the patient's confusion, patient will be placed in psych hold for evaluation by a psychiatrist. Patient continues to deny any physical complaints. Patient's home medications were ordered.    Lorayne Bender, PA-C 03/27/15 2133  Davonna Belling, MD 03/28/15 [phone]

## 2015-03-27 NOTE — ED Notes (Signed)
Pt with hx of only mild confusion brought by family member for confusion, disoriented behaviors such as leaving stove on onset 02/17/15. Pt had MRI last week which showed no acute process, per family. Pt oriented to self only.

## 2015-03-27 NOTE — BH Assessment (Signed)
Tele Assessment Note   [name] is an 60 y.o. male presenting to [facility] due to confusion. Pt is unaware of why he is being seen in the ED.  Pt stated "this is what started this, this right here, my brother started a working". "I don't know what started this".  Pt denies SI, HI and AVH at his time. Pt is a poor historian and recommended that this Probation officer contact his wife for additional information. Pt reported his sleep is poor and did not report any issues with his appetite. Pt denied having access to weapons. When asked about pending criminal charges pt stated "I had a dui in 1937 or 1937". Pt is oriented to person.  It is recommended that pt is seen by psychiatry in the morning.   Diagnosis: Unspecified Delirium   Past Medical History:  Past Medical History  Diagnosis Date  . GERD (gastroesophageal reflux disease)   . 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 Transfusion history 08-16-12    '99  . Pancreatitis 08-16-12    at present weakness,fatiques easily  . Foreign body 08-16-12    nose"BB" pellet since age 71  . History of ETOH abuse 08-16-12    Heavy alcohol use daily  . Anemia   . Anxiety   . Clotting disorder ([place])   . Substance abuse   . Cirrhosis [facility])     Past Surgical History  Procedure Laterality Date  . Neck surgery      fusion of neck-titanium hardware inplaced  . Esophagogastroduodenoscopy    . Colonoscopy    . Eus N/A 08/18/2012    Procedure: UPPER ENDOSCOPIC ULTRASOUND (EUS) LINEAR;  Surgeon: Beryle Beams, MD;  Location: [facility] ENDOSCOPY;  Service: Endoscopy;  Laterality: N/A;  . Neck surgery  [id number]    Family History:  Family History  Problem Relation Age of Onset  . Hypertension Father   . Cancer Mother   . Stroke Mother     Social History:  reports that he quit smoking about 9 years ago. His smoking use included Cigarettes. He has never used smokeless tobacco. He reports that he drinks about 50.4 oz of alcohol per week. He reports that he does not  use illicit drugs.  Additional Social History:  Alcohol / Drug Use History of alcohol / drug use?: Yes Substance #1 Name of Substance 1: Alcohol  1 - Age of First Use: Teen's  1 - Amount (size/oz): 12oz 1 - Frequency: daily  1 - Duration: ongoing  1 - Last Use / Amount: unknown   CIWA: CIWA-Ar BP: 102/61 mmHg Pulse Rate: 88 Nausea and Vomiting: no nausea and no vomiting Tactile Disturbances: none Tremor: two Auditory Disturbances: not present Paroxysmal Sweats: no sweat visible Visual Disturbances: not present Anxiety: no anxiety, at ease Headache, Fullness in Head: none present Agitation: somewhat more than normal activity Orientation and Clouding of Sensorium: disoriented for place or person CIWA-Ar Total: 7 COWS:    PATIENT STRENGTHS: (choose at least two) Average or above average intelligence Supportive family/friends  Allergies:  Allergies  Allergen Reactions  . Aspirin Other (See Comments)    Has upset stomach in high doses only. Took 4 aspirin with EMS, no reaction.    Home Medications:  (Not in a hospital admission)  OB/GYN Status:  No LMP for male patient.  General Assessment Data Location of Assessment: [facility] ED TTS Assessment: In system Is this a Tele or Face-to-Face Assessment?: Face-to-Face Is this an Initial Assessment or a Re-assessment  for this encounter?: Initial Assessment Marital status: Married Living Arrangements: Spouse/significant other Can pt return to current living arrangement?: Yes Admission Status: Voluntary Is patient capable of signing voluntary admission?: Yes Referral Source: Self/Family/Friend Insurance type: [place] Living Arrangements: Spouse/significant other Name of Psychiatrist:  (Unable to assess ) Name of Therapist:  (Unable to assess)  Education Status Is patient currently in school?: No Current Grade: N/A Highest grade of school patient has completed: N/A Name of school: N/A Contact person:  N/A  Risk to self with the past 6 months Suicidal Ideation: No Has patient been a risk to self within the past 6 months prior to admission? : No Suicidal Intent: No Has patient had any suicidal intent within the past 6 months prior to admission? : No Is patient at risk for suicide?: No Suicidal Plan?: No Has patient had any suicidal plan within the past 6 months prior to admission? : No Access to Means: No What has been your use of drugs/alcohol within the last 12 months?: Alcohol  Previous Attempts/Gestures:  (Unable to assess) How many times?:  (Unable to assess) Other Self Harm Risks:  (Unable to assess ) Triggers for Past Attempts:  (Unable to assess ) Intentional Self Injurious Behavior:  (Unable to assess ) Family Suicide History: Unable to assess Recent stressful life event(s):  (Unable to assess ) Persecutory voices/beliefs?:  (Unable to assess ) Depression:  (Unable to assess ) Depression Symptoms:  (Unable to assess) Substance abuse history and/or treatment for substance abuse?: Yes Suicide prevention information given to non-admitted patients: Not applicable  Risk to Others within the past 6 months Homicidal Ideation: No Does patient have any lifetime risk of violence toward others beyond the six months prior to admission? : No Thoughts of Harm to Others: No Current Homicidal Intent: No Current Homicidal Plan: No Access to Homicidal Means: No Identified Victim: N/A History of harm to others?:  (Unable to assess ) Assessment of Violence: None Noted Violent Behavior Description: No violent behaviors observed. PT is calm and cooperative at this time.  Does patient have access to weapons?: No Criminal Charges Pending?: No Does patient have a court date: No Is patient on probation?: No  Psychosis Hallucinations: None noted Delusions: None noted  Mental Status Report Appearance/Hygiene: In scrubs Eye Contact: Fair Motor Activity: Freedom of movement Speech:  Soft Level of Consciousness: Quiet/awake Mood: Euthymic Affect: Appropriate to circumstance Anxiety Level: None Thought Processes: Irrelevant Judgement: Impaired Orientation: Person Obsessive Compulsive Thoughts/Behaviors: None  Cognitive Functioning Concentration: Fair Memory: Recent Impaired, Remote Impaired IQ: Average Insight: Unable to Assess Impulse Control: Unable to Assess Appetite: Good Weight Loss:  (Unable to assess) Weight Gain:  (Unable to assess) Sleep: Unable to Assess Vegetative Symptoms: Unable to Assess  ADLScreening [facility] Assessment Services) Patient's cognitive ability adequate to safely complete daily activities?: No Patient able to express need for assistance with ADLs?: Yes Independently performs ADLs?: No  Prior Inpatient Therapy Prior Inpatient Therapy:  (Unable to assess )  Prior Outpatient Therapy Prior Outpatient Therapy:  (Unable to assess ) Does patient have an ACCT team?: Unknown Does patient have Intensive In-House Services?  : No Does patient have [facility] services? : Unknown Does patient have P4CC services?: Unknown  ADL Screening (condition at time of admission) Patient's cognitive ability adequate to safely complete daily activities?: No Patient able to express need for assistance with ADLs?: Yes Independently performs ADLs?: No       Abuse/Neglect Assessment (Assessment to  be complete while patient is alone) Physical Abuse: Denies Verbal Abuse: Denies Sexual Abuse: Denies Exploitation of patient/patient's resources: Denies Self-Neglect: Denies     Regulatory affairs officer (For Healthcare) Does patient have an advance directive?: Yes Type of Advance Directive: Healthcare Power of Attorney    Additional Information 1:1 In Past 12 Months?: No CIRT Risk: No Elopement Risk: No     Disposition:  Disposition Initial Assessment Completed for this Encounter: Yes Disposition of Patient: Other dispositions (AM Psych eval  )  Samanthia Howland S 03/27/2015 10:00 PM

## 2015-03-28 DIAGNOSIS — D649 Anemia, unspecified: Secondary | ICD-10-CM

## 2015-03-28 DIAGNOSIS — R269 Unspecified abnormalities of gait and mobility: Secondary | ICD-10-CM | POA: Diagnosis present

## 2015-03-28 DIAGNOSIS — D696 Thrombocytopenia, unspecified: Secondary | ICD-10-CM | POA: Diagnosis not present

## 2015-03-28 DIAGNOSIS — G9340 Encephalopathy, unspecified: Secondary | ICD-10-CM

## 2015-03-28 DIAGNOSIS — D731 Hypersplenism: Secondary | ICD-10-CM | POA: Diagnosis present

## 2015-03-28 DIAGNOSIS — D638 Anemia in other chronic diseases classified elsewhere: Secondary | ICD-10-CM | POA: Diagnosis present

## 2015-03-28 DIAGNOSIS — K7031 Alcoholic cirrhosis of liver with ascites: Secondary | ICD-10-CM | POA: Diagnosis not present

## 2015-03-28 DIAGNOSIS — D6959 Other secondary thrombocytopenia: Secondary | ICD-10-CM | POA: Diagnosis present

## 2015-03-28 DIAGNOSIS — F1910 Other psychoactive substance abuse, uncomplicated: Secondary | ICD-10-CM

## 2015-03-28 DIAGNOSIS — K7030 Alcoholic cirrhosis of liver without ascites: Secondary | ICD-10-CM | POA: Diagnosis not present

## 2015-03-28 DIAGNOSIS — F1010 Alcohol abuse, uncomplicated: Secondary | ICD-10-CM

## 2015-03-28 DIAGNOSIS — Z720 Tobacco use: Secondary | ICD-10-CM | POA: Diagnosis not present

## 2015-03-28 DIAGNOSIS — N400 Enlarged prostate without lower urinary tract symptoms: Secondary | ICD-10-CM | POA: Diagnosis present

## 2015-03-28 DIAGNOSIS — R410 Disorientation, unspecified: Secondary | ICD-10-CM | POA: Insufficient documentation

## 2015-03-28 DIAGNOSIS — E512 Wernicke's encephalopathy: Secondary | ICD-10-CM | POA: Diagnosis present

## 2015-03-28 DIAGNOSIS — H532 Diplopia: Secondary | ICD-10-CM | POA: Diagnosis present

## 2015-03-28 DIAGNOSIS — Z781 Physical restraint status: Secondary | ICD-10-CM | POA: Diagnosis not present

## 2015-03-28 DIAGNOSIS — F1020 Alcohol dependence, uncomplicated: Secondary | ICD-10-CM | POA: Diagnosis not present

## 2015-03-28 DIAGNOSIS — F419 Anxiety disorder, unspecified: Secondary | ICD-10-CM | POA: Diagnosis present

## 2015-03-28 DIAGNOSIS — F10231 Alcohol dependence with withdrawal delirium: Secondary | ICD-10-CM | POA: Diagnosis present

## 2015-03-28 DIAGNOSIS — K219 Gastro-esophageal reflux disease without esophagitis: Secondary | ICD-10-CM | POA: Diagnosis present

## 2015-03-28 DIAGNOSIS — R4182 Altered mental status, unspecified: Secondary | ICD-10-CM | POA: Diagnosis present

## 2015-03-28 LAB — TSH: TSH: 3.277 u[IU]/mL (ref 0.350–4.500)

## 2015-03-28 LAB — VITAMIN B12: VITAMIN B 12: 712 pg/mL (ref 180–914)

## 2015-03-28 MED ORDER — QUETIAPINE FUMARATE 25 MG PO TABS
50.0000 mg | ORAL_TABLET | Freq: Two times a day (BID) | ORAL | Status: DC
  Administered 2015-03-28 – 2015-04-03 (×13): 50 mg via ORAL
  Filled 2015-03-28 (×5): qty 2
  Filled 2015-03-28: qty 1
  Filled 2015-03-28 (×8): qty 2

## 2015-03-28 MED ORDER — VITAMIN B-1 100 MG PO TABS
500.0000 mg | ORAL_TABLET | Freq: Three times a day (TID) | ORAL | Status: DC
  Administered 2015-03-28 (×2): 500 mg via ORAL
  Filled 2015-03-28 (×3): qty 5

## 2015-03-28 NOTE — ED Notes (Signed)
Attempted to give report to 5W, awaiting a call back

## 2015-03-28 NOTE — Progress Notes (Signed)
NURSING PROGRESS NOTE  [name]  MRN: [id number]  Admission Data: 03/28/2015  8:48 PM Attending Provider: Caren Griffins, MD  PCP: Woody Seller, MD  Code status: FULL  Allergies:  Allergies  Allergen Reactions  . Aspirin Other (See Comments)    Has upset stomach in high doses only. Took 4 aspirin with EMS, no reaction.     Past Medical History:  has a past medical history of GERD (gastroesophageal reflux disease); Arthritis (08-16-12); Transfusion history (08-16-12); Pancreatitis (08-16-12); Foreign body (08-16-12); History of ETOH abuse (08-16-12); Anemia; Anxiety; Clotting disorder ([place]); Substance abuse; and Cirrhosis ([place]).   Past Surgical History:  has past surgical history that includes Neck surgery; Esophagogastroduodenoscopy; Colonoscopy; EUS (N/A, 08/18/2012); and Neck surgery [id number]).   [name] is a 60 y.o.  male patient, arrived to floor in room [phone] via stretcher, transferred from [facility] ED. Patient alert and oriented X 1, oriented to self only. No acute distress noted. Denies pain.   Vital signs: Oral temperature 98.3 F (36.8 C), Blood pressure 152/89, Pulse 95, RR 18, SpO2 99 % on room air. Height 5'7" (170.2 cm), weight 154 lbs (70.3 kg).   Cardiac monitoring: None  IV access: None. IV team paged for IV access.  Skin: intact, no pressure ulcer noted in sacral area. Skin abrasion noted on left forearm, occlusive dressing in place.  Patient's ID armband verified with patient/ family, and in place. Information packet given to patient/ family. Fall risk assessed, SR up X2. Patient unable to verbalize understanding of risks associated with falls and to call nurse or staff to assist before getting out of bed. Patient very restless and tried to get out of bed several time. Bed alarm was on. Patient/ family oriented to room and equipment. Call bell within reach.

## 2015-03-28 NOTE — Consult Note (Addendum)
[facility] Consult   Reason for Consult:  Altered mental status Referring Physician:  EDP Patient Identification: [name] MRN:  [id number] Principal Diagnosis: Acute encephalopathy Diagnosis:   Patient Active Problem List   Diagnosis Date Noted  . Acute encephalopathy [G93.40] 03/28/2015    Priority: High  . Alcohol use disorder, severe, dependence ([place]) [F10.20] 02/16/2015    Priority: High  . Alcoholic cirrhosis of liver with ascites ([place]) [K70.31]     Priority: High  . Altered mental state [R41.82] 03/28/2015  . Alcohol use with intoxication delirium ([place]) [F10.921] 02/16/2015  . 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 Encephalopathy [G93.40] 02/12/2015  . Hyponatremia [E87.1] 02/12/2015  . AKI (acute kidney injury) ([place]) [N17.9] 02/12/2015  . Acute blood loss anemia [D62]   . Upper GI bleed [K92.2]   . Thrombocytopenia ([place]) [D69.6] 10/10/2014  . GI bleed [K92.2] 10/10/2014  . Cirrhosis of liver ([place]) [K74.60] 10/10/2014  . Pulmonary edema [J81.1] 10/10/2014  . Pancytopenia ([place]) [G64.403] 10/10/2014  . Cellulitis [L03.90] 07/29/2014  . Scrotal swelling [N50.89] 07/29/2014  . Anemia [D64.9] 04/12/2008  . Alcohol abuse [F10.10] 04/12/2008  . GERD [K21.9] 04/12/2008  . PANCREATITIS [K85.90] 04/12/2008  . Blood in stool [K92.1] 04/12/2008  . Other malaise and fatigue [R53.81, R53.83] 04/12/2008  . HEMOPTYSIS [786.3] 04/12/2008  . NAUSEA AND VOMITING [R11.2] 04/12/2008  . EPIGASTRIC PAIN [R10.13] 04/12/2008  . ALCOHOLIC HEPATITIS, HX OF [Z87.19] 04/12/2008    Total Time spent with patient: 45 minutes  Subjective:   [name] is a 60 y.o. male patient admitted with acute encephalopathy .  HPI:  On admission:  60 y.o. male presenting to [facility] due to confusion. Pt is unaware of why he is being seen in the ED. Pt stated "this is what started this, this right here, my brother started a working". "I  don't know what started this". Pt denies SI, HI and AVH at his time. Pt is a poor historian and recommended that this Probation officer contact his wife for additional information. Pt reported his sleep is poor and did not report any issues with his appetite. Pt denied having access to weapons. When asked about pending criminal charges pt stated "I had a dui in 1937 or 1937". Pt is oriented to person.  It is recommended that pt is seen by psychiatry in the morning.   Today: Patient remains confused and believes he is currently in [place].  Past history of severe alcohol/benzodiazepine dependence, cirrhosis of the liver with ascites.  His wife reports a sudden change in his mental status, neuro consult placed.  Past Psychiatric History: Alcohol dependence, severe  Risk to Self: Suicidal Ideation: No Suicidal Intent: No Is patient at risk for suicide?: No Suicidal Plan?: No Access to Means: No What has been your use of drugs/alcohol within the last 12 months?: Alcohol  How many times?:  (Unable to assess) Other Self Harm Risks:  (Unable to assess ) Triggers for Past Attempts:  (Unable to assess ) Intentional Self Injurious Behavior:  (Unable to assess ) Risk to Others: Homicidal Ideation: No Thoughts of Harm to Others: No Current Homicidal Intent: No Current Homicidal Plan: No Access to Homicidal Means: No Identified Victim: N/A History of harm to others?:  (Unable to assess ) Assessment of Violence: None Noted Violent Behavior Description: No violent behaviors observed. PT is calm and cooperative at this time.  Does patient have access to weapons?: No Criminal Charges Pending?: No Does patient have a court  date: No Prior Inpatient Therapy: Prior Inpatient Therapy:  (Unable to assess ) Prior Outpatient Therapy: Prior Outpatient Therapy:  (Unable to assess ) Does patient have an ACCT team?: Unknown Does patient have Intensive In-House Services?  : No Does patient have [facility] services? :  Unknown Does patient have P4CC services?: Unknown  Past Medical History:  Past Medical History  Diagnosis Date  . GERD (gastroesophageal reflux disease)   . 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 Transfusion history 08-16-12    '99  . Pancreatitis 08-16-12    at present weakness,fatiques easily  . Foreign body 08-16-12    nose"BB" pellet since age 60  . History of ETOH abuse 08-16-12    Heavy alcohol use daily  . Anemia   . Anxiety   . Clotting disorder ([place])   . Substance abuse   . Cirrhosis [facility])     Past Surgical History  Procedure Laterality Date  . Neck surgery      fusion of neck-titanium hardware inplaced  . Esophagogastroduodenoscopy    . Colonoscopy    . Eus N/A 08/18/2012    Procedure: UPPER ENDOSCOPIC ULTRASOUND (EUS) LINEAR;  Surgeon: Beryle Beams, MD;  Location: [facility] ENDOSCOPY;  Service: Endoscopy;  Laterality: N/A;  . Neck surgery  [id number]   Family History:  Family History  Problem Relation Age of Onset  . Hypertension Father   . Cancer Mother   . Stroke Mother    Family Psychiatric  History: None Social History:  History  Alcohol Use  . 50.4 oz/week  . 84 Cans of beer per week    Comment: 12 cans beer per day     History  Drug Use No    Social History   Social History  . Marital Status: Married    Spouse Name: N/A  . Number of Children: N/A  . Years of Education: N/A   Social History Main Topics  . Smoking status: Former Smoker    Types: Cigarettes    Quit date: 08/16/2005  . Smokeless tobacco: Never Used  . Alcohol Use: 50.4 oz/week    84 Cans of beer per week     Comment: 12 cans beer per day  . Drug Use: No  . Sexual Activity: Yes   Other Topics Concern  . None   Social History Narrative   Additional Social History:    History of alcohol / drug use?: Yes Name of Substance 1: Alcohol  1 - Age of First Use: Teen's  1 - Amount (size/oz): 12oz 1 - Frequency: daily  1 - Duration: ongoing  1 - Last Use / Amount: unknown                     Allergies:   Allergies  Allergen Reactions  . Aspirin Other (See Comments)    Has upset stomach in high doses only. Took 4 aspirin with EMS, no reaction.    Labs:  Results for orders placed or performed during the hospital encounter of 03/27/15 (from the past 48 hour(s))  Comprehensive metabolic panel     Status: Abnormal   Collection Time: 03/27/15  4:19 PM  Result Value Ref Range   Sodium 141 135 - 145 mmol/L   Potassium 4.1 3.5 - 5.1 mmol/L   Chloride 106 101 - 111 mmol/L   CO2 25 22 - 32 mmol/L   Glucose, Bld 89 65 - 99 mg/dL   BUN 17 6 - 20 mg/dL  Creatinine, Ser 0.93 0.61 - 1.24 mg/dL   Calcium 9.5 8.9 - 10.3 mg/dL   Total Protein 7.5 6.5 - 8.1 g/dL   Albumin 3.3 (L) 3.5 - 5.0 g/dL   AST 29 15 - 41 U/L   ALT 14 (L) 17 - 63 U/L   Alkaline Phosphatase 76 38 - 126 U/L   Total Bilirubin 0.8 0.3 - 1.2 mg/dL   GFR calc non Af Amer >60 >60 mL/min   GFR calc Af Amer >60 >60 mL/min    Comment: (NOTE) The eGFR has been calculated using the CKD EPI equation. This calculation has not been validated in all clinical situations. eGFR's persistently <60 mL/min signify possible Chronic Kidney Disease.    Anion gap 10 5 - 15  Ethanol     Status: None   Collection Time: 03/27/15  4:19 PM  Result Value Ref Range   Alcohol, Ethyl (B) <5 <5 mg/dL    Comment:        LOWEST DETECTABLE LIMIT FOR SERUM ALCOHOL IS 5 mg/dL FOR MEDICAL PURPOSES ONLY   CBC with Diff     Status: Abnormal   Collection Time: 03/27/15  4:19 PM  Result Value Ref Range   WBC 4.1 4.0 - 10.5 K/uL   RBC 2.67 (L) 4.22 - 5.81 MIL/uL   Hemoglobin 8.7 (L) 13.0 - 17.0 g/dL   HCT 25.7 (L) 39.0 - 52.0 %   MCV 96.3 78.0 - 100.0 fL   MCH 32.6 26.0 - 34.0 pg   MCHC 33.9 30.0 - 36.0 g/dL   RDW 15.7 (H) 11.5 - 15.5 %   Platelets 110 (L) 150 - 400 K/uL    Comment: SPECIMEN CHECKED FOR CLOTS REPEATED TO VERIFY PLATELET COUNT CONFIRMED BY SMEAR    Neutrophils Relative % 52 %   Lymphocytes  Relative 34 %   Monocytes Relative 11 %   Eosinophils Relative 2 %   Basophils Relative 1 %   Neutro Abs 2.1 1.7 - 7.7 K/uL   Lymphs Abs 1.4 0.7 - 4.0 K/uL   Monocytes Absolute 0.5 0.1 - 1.0 K/uL   Eosinophils Absolute 0.1 0.0 - 0.7 K/uL   Basophils Absolute 0.0 0.0 - 0.1 K/uL   RBC Morphology ACANTHOCYTES   Salicylate level     Status: None   Collection Time: 03/27/15  4:19 PM  Result Value Ref Range   Salicylate Lvl <9.2 2.8 - 30.0 mg/dL  Acetaminophen level     Status: Abnormal   Collection Time: 03/27/15  4:19 PM  Result Value Ref Range   Acetaminophen (Tylenol), Serum <10 (L) 10 - 30 ug/mL    Comment:        THERAPEUTIC CONCENTRATIONS VARY SIGNIFICANTLY. A RANGE OF 10-30 ug/mL MAY BE AN EFFECTIVE CONCENTRATION FOR MANY PATIENTS. HOWEVER, SOME ARE BEST TREATED AT CONCENTRATIONS OUTSIDE THIS RANGE. ACETAMINOPHEN CONCENTRATIONS >150 ug/mL AT 4 HOURS AFTER INGESTION AND >50 ug/mL AT 12 HOURS AFTER INGESTION ARE OFTEN ASSOCIATED WITH TOXIC REACTIONS.   Ammonia     Status: Abnormal   Collection Time: 03/27/15  4:19 PM  Result Value Ref Range   Ammonia 39 (H) 9 - 35 umol/L  Lipase, blood     Status: None   Collection Time: 03/27/15  4:19 PM  Result Value Ref Range   Lipase 15 11 - 51 U/L  Amylase     Status: None   Collection Time: 03/27/15  4:19 PM  Result Value Ref Range   Amylase 86 28 - 100 U/L  CBG monitoring, ED     Status: None   Collection Time: 03/27/15  6:49 PM  Result Value Ref Range   Glucose-Capillary 91 65 - 99 mg/dL  Urine rapid drug screen (hosp performed)not at [facility]     Status: Abnormal   Collection Time: 03/27/15  7:00 PM  Result Value Ref Range   Opiates NONE DETECTED NONE DETECTED   Cocaine NONE DETECTED NONE DETECTED   Benzodiazepines POSITIVE (A) NONE DETECTED   Amphetamines NONE DETECTED NONE DETECTED   Tetrahydrocannabinol NONE DETECTED NONE DETECTED   Barbiturates NONE DETECTED NONE DETECTED    Comment:        DRUG SCREEN FOR MEDICAL  PURPOSES ONLY.  IF CONFIRMATION IS NEEDED FOR ANY PURPOSE, NOTIFY LAB WITHIN 5 DAYS.        LOWEST DETECTABLE LIMITS FOR URINE DRUG SCREEN Drug Class       Cutoff (ng/mL) Amphetamine      1000 Barbiturate      200 Benzodiazepine   696 Tricyclics       295 Opiates          300 Cocaine          300 THC              50   Urinalysis, Routine w reflex microscopic (not at [facility])     Status: Abnormal   Collection Time: 03/27/15  7:00 PM  Result Value Ref Range   Color, Urine YELLOW YELLOW   APPearance CLOUDY (A) CLEAR   Specific Gravity, Urine 1.026 1.005 - 1.030   pH 7.0 5.0 - 8.0   Glucose, UA NEGATIVE NEGATIVE mg/dL   Hgb urine dipstick NEGATIVE NEGATIVE   Bilirubin Urine NEGATIVE NEGATIVE   Ketones, ur NEGATIVE NEGATIVE mg/dL   Protein, ur NEGATIVE NEGATIVE mg/dL   Nitrite NEGATIVE NEGATIVE   Leukocytes, UA NEGATIVE NEGATIVE    Comment: MICROSCOPIC NOT DONE ON URINES WITH NEGATIVE PROTEIN, BLOOD, LEUKOCYTES, NITRITE, OR GLUCOSE <1000 mg/dL.  Vitamin B12     Status: None   Collection Time: 03/28/15 12:49 PM  Result Value Ref Range   Vitamin B-12 712 180 - 914 pg/mL    Comment: (NOTE) This assay is not validated for testing neonatal or myeloproliferative syndrome specimens for Vitamin B12 levels. Performed at [facility]   TSH     Status: None   Collection Time: 03/28/15 12:49 PM  Result Value Ref Range   TSH 3.277 0.350 - 4.500 uIU/mL    Current Facility-Administered Medications  Medication Dose Route Frequency Provider Last Rate Last Dose  . acetaminophen (TYLENOL) tablet 650 mg  650 mg Oral Q4H PRN Shawn C Joy, PA-C      . alum & mag hydroxide-simeth (MAALOX/MYLANTA) 200-200-20 MG/5ML suspension 30 mL  30 mL Oral PRN Shawn C Joy, PA-C      . lactulose (CHRONULAC) 10 GM/15ML solution 20 g  20 g Oral BID Shawn C Joy, PA-C   20 g at 03/28/15 1017  . LORazepam (ATIVAN) injection 0-4 mg  0-4 mg Intravenous 4 times per day Lorayne Bender, PA-C   Stopped at 03/28/15 0340   . LORazepam (ATIVAN) injection 0-4 mg  0-4 mg Intravenous Q12H Shawn Rosebud Poles, PA-C   Stopped at 03/28/15 0343  . LORazepam (ATIVAN) tablet 0-4 mg  0-4 mg Oral 4 times per day Shawn C Joy, PA-C   1 mg at 03/28/15 1244  . LORazepam (ATIVAN) tablet 0-4 mg  0-4 mg Oral Q12H Shawn C Joy,  PA-C   2 mg at 03/28/15 1553  . LORazepam (ATIVAN) tablet 1 mg  1 mg Oral Q8H PRN Shawn C Joy, PA-C      . nicotine (NICODERM CQ - dosed in mg/24 hours) patch 21 mg  21 mg Transdermal Daily Shawn C Joy, PA-C   21 mg at 03/27/15 2206  . ondansetron (ZOFRAN) tablet 4 mg  4 mg Oral Q8H PRN Shawn C Joy, PA-C      . QUEtiapine (SEROQUEL) tablet 50 mg  50 mg Oral BID Maxon Kresse   50 mg at 03/28/15 1403  . spironolactone (ALDACTONE) tablet 100 mg  100 mg Oral Daily Shawn C Joy, PA-C   100 mg at 03/28/15 1017  . tamsulosin (FLOMAX) capsule 0.4 mg  0.4 mg Oral Daily Shawn C Joy, PA-C   0.4 mg at 03/28/15 1403  . thiamine (VITAMIN B-1) tablet 100 mg  100 mg Oral Daily Shawn C Joy, PA-C   100 mg at 03/28/15 1017  . thiamine (VITAMIN B-1) tablet 500 mg  500 mg Oral TID Nat Christen, MD   500 mg at 03/28/15 1553  . traZODone (DESYREL) tablet 50 mg  50 mg Oral QHS Shawn C Joy, PA-C   50 mg at 03/27/15 2207   Current Outpatient Prescriptions  Medication Sig Dispense Refill  . acetaminophen (TYLENOL) 325 MG tablet Take 2 tablets (650 mg total) by mouth every 8 (eight) hours as needed for mild pain, moderate pain, fever or headache (or Fever >/= 101).    . diazepam (VALIUM) 10 MG tablet Take 5-10 mg by mouth 3 (three) times daily as needed for anxiety.    . folic acid (FOLVITE) 889 MCG tablet Take 400 mcg by mouth daily.    Marland Kitchen lactulose (CHRONULAC) 10 GM/15ML solution Take 20 g by mouth 2 (two) times daily.    . naproxen sodium (ANAPROX) 220 MG tablet Take 440 mg by mouth daily as needed (pain).    . nystatin (MYCOSTATIN) 100000 UNIT/ML suspension Take 5 mLs by mouth 4 (four) times daily. Reported on 03/27/2015    . QUEtiapine  (SEROQUEL) 25 MG tablet Take 50-100 mg by mouth 3 (three) times daily. Take 42m twice daily and 1010mat bedtime    . spironolactone (ALDACTONE) 100 MG tablet Take 1 tablet (100 mg total) by mouth daily. (Patient taking differently: Take 50 mg by mouth daily. ) 30 tablet 0  . tamsulosin (FLOMAX) 0.4 MG CAPS capsule Take 0.4 mg by mouth daily.    . Marland Kitchenhiamine 100 MG tablet Take 1 tablet (100 mg total) by mouth daily. 30 tablet 0  . traZODone (DESYREL) 50 MG tablet Take 1 tablet (50 mg total) by mouth at bedtime. (Patient taking differently: Take 100 mg by mouth at bedtime as needed for sleep. ) 30 tablet 0  . ketoconazole (NIZORAL) 2 % cream Apply topically daily. Apply to affect area.  Use for one week. 15 g 0  . Multiple Vitamin (MULTIVITAMIN WITH MINERALS) TABS tablet Take 1 tablet by mouth daily.      Musculoskeletal: Strength & Muscle Tone: within normal limits Gait & Station: normal Patient leans: N/A  Psychiatric Specialty Exam: Review of Systems  Constitutional: Negative.   HENT: Negative.   Eyes: Negative.   Respiratory: Negative.   Cardiovascular: Negative.   Gastrointestinal: Negative.   Genitourinary: Negative.   Musculoskeletal: Negative.   Skin: Negative.   Neurological: Negative.   Endo/Heme/Allergies: Negative.   Psychiatric/Behavioral: Positive for memory loss and substance abuse.  Blood pressure 118/93, pulse 62, temperature 98.3 F (36.8 C), temperature source Oral, resp. rate 18, SpO2 100 %.There is no weight on file to calculate BMI.  General Appearance: Disheveled  Eye Sport and exercise psychologist::  Fair  Speech:  Normal Rate  Volume:  Decreased  Mood:  Anxious  Affect:  Blunt  Thought Process:  Loose  Orientation:  Other:  person  Thought Content:  confusion  Suicidal Thoughts:  No  Homicidal Thoughts:  No  Memory:  Immediate;   Poor Recent;   Poor Remote;   Poor  Judgement:  Impaired  Insight:  Lacking  Psychomotor Activity:  Normal  Concentration:  Poor  Recall:   Poor  Fund of Knowledge:Fair  Language: Poor  Akathisia:  No  Handed:  Right  AIMS (if indicated):     Assets:  Housing Leisure Time Physical Health Resilience Social Support  ADL's:  Intact  Cognition: Impaired,  Moderate  Sleep:      Treatment Plan Summary: Daily contact with patient to assess and evaluate symptoms and progress in treatment, Medication management and Plan acute encephalopathy due to alcohol dependence: -Crisis stabilization -Medication management: Ativan alcohol detox protocol in place along with Thiamine 500 mg TID to prevent Wernicke's syndrome.  Lactulose 20 mg BID to decrease ammonia levels started.  His Valium TID from his home medication list was not continued.  Trazodone 50 mg at bedtime for sleep was restarted.  His Seroquel 50-100 mg TID changed to 50 mg BID. -Individual and substance abuse counseling -Neuro consult placed  Disposition: Recommend psychiatric Inpatient admission when medically cleared.  Waylan Boga, [place] 03/28/2015 5:12 PM Patient seen face-to-face for psychiatric evaluation, chart reviewed and case discussed with the physician extender and developed treatment plan. Reviewed the information documented and agree with the treatment plan. Corena Pilgrim, MD

## 2015-03-28 NOTE — ED Provider Notes (Signed)
Discussed case with neurologist. He was concerned about Wernicke's encephalopathy. Recommend thiamine 500 mg po tid.  Admit to general medicine.  Nat Christen, MD 03/28/15 1600

## 2015-03-28 NOTE — ED Notes (Signed)
MD at bedside.  Neuro consult MD

## 2015-03-28 NOTE — Consult Note (Signed)
NEURO HOSPITALIST CONSULT NOTE   Requestig physician: Dr. Alvino Chapel   Reason for Consult: confusion  HPI:                                                                                                                                          [name] is an 60 y.o. male with a history of substance abuse, pancreatitis, alcoholism, anemia, cirrhosis of liver, and anxiety, presenting to the ED with reports of patient "not acting right" over the last month.  Patient was admitted in December for confusion, itchiness and figetiness.  At that time he was drinking ETOH from early morning till he went to sleep (>12 pack +/day for >10 or more years). In ED he had a NA 120 ammonia of 36 and K+ 2.5.  He was place on [facility] and then treated for underlying metabolic abnormalities.  Since discharge wife noted he has not been the same. Since then he has had continued odd behavior such as dressing in odd layers, visually hallucinating, and multiple falls. MRI obtained last week noted no abnormalities.    Wife is not present while I was consulting. I did call her and she notes he has been doing strange things such as spitting on the floor, cursing, touching everything he sees, agitated. "She used to attribute this to his drinking but now is not sure what is going on." Patient refused to answer many questions and when I asked if he is still drinking he stated "yes, what ever I can get my hands on" when asked about specific amounts he noted I "will never give you that answer". He did admit that he is hiding his drinking from his wife. His speech is very muffled and hard to understand. He is aware he is in the hospital and the date.   Labs are notable for low albumin 3.3, Plt 110, ammonia 39,     Past Medical History  Diagnosis Date  . GERD (gastroesophageal reflux disease)   . 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 Transfusion history 08-16-12    '99  . Pancreatitis 08-16-12    at  present weakness,fatiques easily  . Foreign body 08-16-12    nose"BB" pellet since age 40  . History of ETOH abuse 08-16-12    Heavy alcohol use daily  . Anemia   . Anxiety   . Clotting disorder ([place])   . Substance abuse   . Cirrhosis [facility])     Past Surgical History  Procedure Laterality Date  . Neck surgery      fusion of neck-titanium hardware inplaced  . Esophagogastroduodenoscopy    . Colonoscopy    . Eus N/A 08/18/2012    Procedure: UPPER ENDOSCOPIC ULTRASOUND (EUS) LINEAR;  Surgeon: Beryle Beams, MD;  Location: [facility];  Service: Endoscopy;  Laterality: N/A;  . Neck surgery  [id number]    Family History  Problem Relation Age of Onset  . Hypertension Father   . Cancer Mother   . Stroke Mother       Social History:  reports that he quit smoking about 9 years ago. His smoking use included Cigarettes. He has never used smokeless tobacco. He reports that he drinks about 50.4 oz of alcohol per week. He reports that he does not use illicit drugs.  Allergies  Allergen Reactions  . Aspirin Other (See Comments)    Has upset stomach in high doses only. Took 4 aspirin with EMS, no reaction.    MEDICATIONS:                                                                                                                     Current Facility-Administered Medications  Medication Dose Route Frequency Provider Last Rate Last Dose  . acetaminophen (TYLENOL) tablet 650 mg  650 mg Oral Q4H PRN Shawn C Joy, PA-C      . alum & mag hydroxide-simeth (MAALOX/MYLANTA) 200-200-20 MG/5ML suspension 30 mL  30 mL Oral PRN Shawn C Joy, PA-C      . lactulose (CHRONULAC) 10 GM/15ML solution 20 g  20 g Oral BID Shawn C Joy, PA-C   20 g at 03/28/15 1017  . LORazepam (ATIVAN) injection 0-4 mg  0-4 mg Intravenous 4 times per day Lorayne Bender, PA-C   Stopped at 03/28/15 0340  . LORazepam (ATIVAN) injection 0-4 mg  0-4 mg Intravenous Q12H Shawn Rosebud Poles, PA-C   Stopped at 03/28/15 0343  . LORazepam (ATIVAN)  tablet 0-4 mg  0-4 mg Oral 4 times per day Lorayne Bender, PA-C   Stopped at 03/28/15 [id number]  . LORazepam (ATIVAN) tablet 0-4 mg  0-4 mg Oral Q12H Shawn C Joy, PA-C   Stopped at 03/28/15 0343  . LORazepam (ATIVAN) tablet 1 mg  1 mg Oral Q8H PRN Shawn C Joy, PA-C      . nicotine (NICODERM CQ - dosed in mg/24 hours) patch 21 mg  21 mg Transdermal Daily Shawn C Joy, PA-C   21 mg at 03/27/15 2206  . ondansetron (ZOFRAN) tablet 4 mg  4 mg Oral Q8H PRN Shawn C Joy, PA-C      . QUEtiapine (SEROQUEL) tablet 50 mg  50 mg Oral BID Mojeed Akintayo      . spironolactone (ALDACTONE) tablet 100 mg  100 mg Oral Daily Shawn C Joy, PA-C   100 mg at 03/28/15 1017  . tamsulosin (FLOMAX) capsule 0.4 mg  0.4 mg Oral Daily Shawn C Joy, PA-C   0.4 mg at 03/27/15 2207  . thiamine (VITAMIN B-1) tablet 100 mg  100 mg Oral Daily Shawn C Joy, PA-C   100 mg at 03/28/15 1017  . traZODone (DESYREL) tablet 50 mg  50 mg Oral QHS Shawn C Joy, PA-C   50 mg at 03/27/15 2207  Current Outpatient Prescriptions  Medication Sig Dispense Refill  . acetaminophen (TYLENOL) 325 MG tablet Take 2 tablets (650 mg total) by mouth every 8 (eight) hours as needed for mild pain, moderate pain, fever or headache (or Fever >/= 101).    . diazepam (VALIUM) 10 MG tablet Take 5-10 mg by mouth 3 (three) times daily as needed for anxiety.    . folic acid (FOLVITE) A999333 MCG tablet Take 400 mcg by mouth daily.    Marland Kitchen lactulose (CHRONULAC) 10 GM/15ML solution Take 20 g by mouth 2 (two) times daily.    . naproxen sodium (ANAPROX) 220 MG tablet Take 440 mg by mouth daily as needed (pain).    . nystatin (MYCOSTATIN) 100000 UNIT/ML suspension Take 5 mLs by mouth 4 (four) times daily. Reported on 03/27/2015    . QUEtiapine (SEROQUEL) 25 MG tablet Take 50-100 mg by mouth 3 (three) times daily. Take 50mg  twice daily and 100mg  at bedtime    . spironolactone (ALDACTONE) 100 MG tablet Take 1 tablet (100 mg total) by mouth daily. (Patient taking differently: Take 50 mg by  mouth daily. ) 30 tablet 0  . tamsulosin (FLOMAX) 0.4 MG CAPS capsule Take 0.4 mg by mouth daily.    Marland Kitchen thiamine 100 MG tablet Take 1 tablet (100 mg total) by mouth daily. 30 tablet 0  . traZODone (DESYREL) 50 MG tablet Take 1 tablet (50 mg total) by mouth at bedtime. (Patient taking differently: Take 100 mg by mouth at bedtime as needed for sleep. ) 30 tablet 0  . ketoconazole (NIZORAL) 2 % cream Apply topically daily. Apply to affect area.  Use for one week. 15 g 0  . Multiple Vitamin (MULTIVITAMIN WITH MINERALS) TABS tablet Take 1 tablet by mouth daily.        ROS:                                                                                                                                       History obtained from unobtainable from patient due to mental status and refusal to talk   Blood pressure 116/77, pulse 85, temperature 97.8 F (36.6 C), temperature source Oral, resp. rate 16, SpO2 95 %.   Neurologic Examination:                                                                                                      HEENT-  Normocephalic, no lesions, without obvious abnormality.  Normal external eye and conjunctiva.  Normal TM's  bilaterally.  Normal auditory canals and external ears. Normal external nose, mucus membranes and septum.  Normal pharynx. Cardiovascular- S1, S2 normal, pulses palpable throughout   Lungs- chest clear, no wheezing, rales, normal symmetric air entry Abdomen- normal findings: bowel sounds normal Extremities- no edema Lymph-no adenopathy palpable Musculoskeletal-no joint tenderness, deformity or swelling Skin-warm and dry, no hyperpigmentation, vitiligo, or suspicious lesions  Neurological Examination Mental Status: Alert, oriented, thought content appropriate.  Speech dysarthric and very hard to understand.  Able to follow 3 step commands. Cranial Nerves: II: Discs flat bilaterally; Visual fields grossly normal, pupils equal, round, reactive to light  and accommodation III,IV, VI: ptosis not present, extra-ocular motions intact bilaterally V,VII: smile symmetric, facial light touch sensation normal bilaterally VIII: hearing normal bilaterally IX,X: uvula rises symmetrically XI: bilateral shoulder shrug XII: midline tongue extension Motor: Right : Upper extremity   5/5    Left:     Upper extremity   5/5  Lower extremity   5/5     Lower extremity   5/5 Tone and bulk:normal tone throughout; no atrophy noted Sensory: Pinprick and light touch intact throughout, with distal LE decreased sensation Deep Tendon Reflexes: 2+ and symmetric throughout and brisk--previous cervical surgery and planning on a future surgery Plantars: Right: downgoing   Left: downgoing Cerebellar: normal finger-to-nose, refused to take part in heel to shin Gait: not tested.       Lab Results: Basic Metabolic Panel:  Recent Labs Lab 03/27/15 1619  NA 141  K 4.1  CL 106  CO2 25  GLUCOSE 89  BUN 17  CREATININE 0.93  CALCIUM 9.5    Liver Function Tests:  Recent Labs Lab 03/27/15 1619  AST 29  ALT 14*  ALKPHOS 76  BILITOT 0.8  PROT 7.5  ALBUMIN 3.3*    Recent Labs Lab 03/27/15 1619  LIPASE 15  AMYLASE 86    Recent Labs Lab 03/27/15 1619  AMMONIA 39*    CBC:  Recent Labs Lab 03/27/15 1619  WBC 4.1  NEUTROABS 2.1  HGB 8.7*  HCT 25.7*  MCV 96.3  PLT 110*    Cardiac Enzymes: No results for input(s): CKTOTAL, CKMB, CKMBINDEX, TROPONINI in the last 168 hours.  Lipid Panel: No results for input(s): CHOL, TRIG, HDL, CHOLHDL, VLDL, LDLCALC in the last 168 hours.  CBG:  Recent Labs Lab 03/27/15 1849  [id number]    Microbiology: Results for orders placed or performed during the hospital encounter of 02/21/15  Culture, Urine     Status: None   Collection Time: 02/21/15  3:55 PM  Result Value Ref Range Status   Specimen Description URINE, RANDOM  Final   Special Requests NONE  Final   Culture   Final    NO GROWTH 2  DAYS Performed at [facility]    Report Status 02/23/2015 FINAL  Final    Coagulation Studies: No results for input(s): LABPROT, INR in the last 72 hours.  Imaging: Dg Chest 2 View  03/27/2015  CLINICAL DATA:  Confusion, abnormal behavior and disorientation, medical clearance, history cirrhosis, smoking EXAM: CHEST  2 VIEW COMPARISON:  02/14/2015 FINDINGS: Minimal enlargement of cardiac silhouette. Mediastinal contours and pulmonary vascularity normal. Bibasilar atelectasis. Lungs otherwise clear. No infiltrate, pleural effusion or pneumothorax. Prior cervicothoracic fusion. No acute bony findings. IMPRESSION: Minimal enlargement of cardiac silhouette and bibasilar atelectasis. Electronically Signed   By: Lavonia Dana M.D.   On: 03/27/2015 16:23       Assessment and plan per attending neurologist  Etta Quill PA-C [facility] [phone]  03/28/2015, 11:30 AM   Assessment/Plan: 60 yo M with altered mental status since an episode concerning for alcohol withdrawal in late November. He had recently quit drinking at that time. With gait disturbance, patient reported diploplia, and confusion since this episode, I do suspect that this represents Wernicke encephalopathy.   He has been receiving PO thiamine, but sometimes this is not enough. I would favor several days of high dose repletion.   Other causes of encephalopathy are less likely given the characteristic symptoms and timing. I doubt that he will have full recovery, however.   1) Thiamine 500mg  IV TID for 3 days.  2) EEG 3) Will continue to follow.   Roland Rack, MD [facility] [phone]  If 7pm- 7am, please page neurology on call as listed in [place].

## 2015-03-28 NOTE — Progress Notes (Signed)
Received report on patient from [place], [place] from [facility] ED. Awaiting patient to be transferred to [place].

## 2015-03-28 NOTE — H&P (Signed)
History and Physical    [name] [id number] DOB: [birth year] DOA: 03/27/2015  Referring physician: Dr. Lacinda Axon PCP: Woody Seller, MD  Specialists: Neurology  Chief Complaint: AMS  HPI: [name] is a 60 y.o. male has a past medical history significant for alcohol abuse, liver cirrhosis, anemia, pancytopenia, polysubstance abuse, was being admitted to the behavioral health unit in the emergency room at [facility] since yesterday for altered mental status. Patient is quite confused in the ED, alert to person only, he doesn't really answer any other questions asked with a direct answer, no family is at bedside so history obtained via chart review, discussing with the EDP as well as discussing with her neurologist. Basically patient was admitted with acute metabolic encephalopathy in the setting of active alcohol abuse in December 2016, improved and discharged home at that time. Since he has been home, he continued to be confused and never really improved, with intermittent hallucinations, odd behavior, poor appetite as well as altered sleeping habits. Apparently he hasn't been drinking alcohol since per his wife, however patient does state that he is still drinking. Patient was admitted here on 1/12, he has been encephalopathic since, and on psychiatry evaluation today there was concern about an encephalopathy that is more than simple alcohol withdrawal. Neurology was consulted and evaluated patient in the ED, and they're concerned about work is encephalopathy, recommended high-dose thiamine and admission over to [facility]. [facility] asked for admission.  Patient is alert to person only, he has no complaints for me, denies any chest pain, denies any shortness of breath, cannot continue to the story as to why he is here other than "have to get some tests done". Blood work in the ED is pertinent for thrombocytopenia, chronic anemia. Ammonia level is mildly elevated.   Review  of Systems: Unable to obtain review of systems due to acute encephalopathy  Past Medical History  Diagnosis Date  . GERD (gastroesophageal reflux disease)   . 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 Transfusion history 08-16-12    '99  . Pancreatitis 08-16-12    at present weakness,fatiques easily  . Foreign body 08-16-12    nose"BB" pellet since age 24  . History of ETOH abuse 08-16-12    Heavy alcohol use daily  . Anemia   . Anxiety   . Clotting disorder ([place])   . Substance abuse   . Cirrhosis [facility])    Past Surgical History  Procedure Laterality Date  . Neck surgery      fusion of neck-titanium hardware inplaced  . Esophagogastroduodenoscopy    . Colonoscopy    . Eus N/A 08/18/2012    Procedure: UPPER ENDOSCOPIC ULTRASOUND (EUS) LINEAR;  Surgeon: Beryle Beams, MD;  Location: [facility];  Service: Endoscopy;  Laterality: N/A;  . Neck surgery  [id number]   Social History:  reports that he quit smoking about 9 years ago. His smoking use included Cigarettes. He has never used smokeless tobacco. He reports that he drinks about 50.4 oz of alcohol per week. He reports that he does not use illicit drugs.  Allergies  Allergen Reactions  . Aspirin Other (See Comments)    Has upset stomach in high doses only. Took 4 aspirin with EMS, no reaction.    Family History  Problem Relation Age of Onset  . Hypertension Father   . Cancer Mother   . Stroke Mother     Prior to Admission medications   Medication Sig Start Date  End Date Taking? Authorizing Provider  acetaminophen (TYLENOL) 325 MG tablet Take 2 tablets (650 mg total) by mouth every 8 (eight) hours as needed for mild pain, moderate pain, fever or headache (or Fever >/= 101). 08/05/14  Yes Modena Jansky, MD  diazepam (VALIUM) 10 MG tablet Take 5-10 mg by mouth 3 (three) times daily as needed for anxiety.   Yes Historical Provider, MD  folic acid (FOLVITE) A999333 MCG tablet Take 400 mcg by mouth daily.   Yes Historical Provider,  MD  lactulose (CHRONULAC) 10 GM/15ML solution Take 20 g by mouth 2 (two) times daily.   Yes Historical Provider, MD  naproxen sodium (ANAPROX) 220 MG tablet Take 440 mg by mouth daily as needed (pain).   Yes Historical Provider, MD  nystatin (MYCOSTATIN) 100000 UNIT/ML suspension Take 5 mLs by mouth 4 (four) times daily. Reported on 03/27/2015   Yes Historical Provider, MD  QUEtiapine (SEROQUEL) 25 MG tablet Take 50-100 mg by mouth 3 (three) times daily. Take 50mg  twice daily and 100mg  at bedtime 03/27/15  Yes Historical Provider, MD  spironolactone (ALDACTONE) 100 MG tablet Take 1 tablet (100 mg total) by mouth daily. Patient taking differently: Take 50 mg by mouth daily.  10/17/14  Yes Charlynne Cousins, MD  tamsulosin (FLOMAX) 0.4 MG CAPS capsule Take 0.4 mg by mouth daily.   Yes Historical Provider, MD  thiamine 100 MG tablet Take 1 tablet (100 mg total) by mouth daily. 08/05/14  Yes Modena Jansky, MD  traZODone (DESYREL) 50 MG tablet Take 1 tablet (50 mg total) by mouth at bedtime. Patient taking differently: Take 100 mg by mouth at bedtime as needed for sleep.  02/17/15  Yes Maryann Mikhail, DO  ketoconazole (NIZORAL) 2 % cream Apply topically daily. Apply to affect area.  Use for one week. 02/17/15   Maryann Mikhail, DO  Multiple Vitamin (MULTIVITAMIN WITH MINERALS) TABS tablet Take 1 tablet by mouth daily. 02/17/15   Cristal Ford, DO   Physical Exam: Filed Vitals:   03/27/15 1447 03/27/15 2210 03/28/15 0514 03/28/15 1258  BP:  126/86 116/77 118/93  Pulse: 88 68 85 62  Temp: 98.2 F (36.8 C)  97.8 F (36.6 C) 98.3 F (36.8 C)  TempSrc: Oral  Oral Oral  Resp: 16 16 16 18   SpO2: 97% 100% 95% 100%     GENERAL: NAD, disheveled appearance   HEENT: head NCAT, no scleral icterus. Pupils round and reactive. Mucous membranes are moist.   NECK: Supple. No carotid bruits.   LUNGS: Clear to auscultation. No wheezing or crackles  HEART: Regular rate and rhythm without murmur. 2+  pulses, no JVD, no peripheral edema  ABDOMEN: Soft, nontender, and nondistended. Positive bowel sounds.   EXTREMITIES: Without any cyanosis, clubbing, rash, lesions or edema.  NEUROLOGIC: Alert and oriented x1. neuro exam nonfocal. No nystagmus.    Labs on Admission:  Basic Metabolic Panel:  Recent Labs Lab 03/27/15 1619  NA 141  K 4.1  CL 106  CO2 25  GLUCOSE 89  BUN 17  CREATININE 0.93  CALCIUM 9.5   Liver Function Tests:  Recent Labs Lab 03/27/15 1619  AST 29  ALT 14*  ALKPHOS 76  BILITOT 0.8  PROT 7.5  ALBUMIN 3.3*    Recent Labs Lab 03/27/15 1619  LIPASE 15  AMYLASE 86    Recent Labs Lab 03/27/15 1619  AMMONIA 39*   CBC:  Recent Labs Lab 03/27/15 1619  WBC 4.1  NEUTROABS 2.1  HGB 8.7*  HCT 25.7*  MCV 96.3  PLT 110*   Cardiac Enzymes: No results for input(s): CKTOTAL, CKMB, CKMBINDEX, TROPONINI in the last 168 hours.  BNP (last 3 results)  Recent Labs  10/10/14 1702  BNP 120.9*    ProBNP (last 3 results) No results for input(s): PROBNP in the last 8760 hours.  CBG:  Recent Labs Lab 03/27/15 1849  GLUCAP 91    Radiological Exams on Admission: Dg Chest 2 View  03/27/2015  CLINICAL DATA:  Confusion, abnormal behavior and disorientation, medical clearance, history cirrhosis, smoking EXAM: CHEST  2 VIEW COMPARISON:  02/14/2015 FINDINGS: Minimal enlargement of cardiac silhouette. Mediastinal contours and pulmonary vascularity normal. Bibasilar atelectasis. Lungs otherwise clear. No infiltrate, pleural effusion or pneumothorax. Prior cervicothoracic fusion. No acute bony findings. IMPRESSION: Minimal enlargement of cardiac silhouette and bibasilar atelectasis. Electronically Signed   By: Lavonia Dana M.D.   On: 03/27/2015 16:23    EKG: Independently reviewed.  Assessment/Plan Principal Problem:   Acute encephalopathy Active Problems:   Anemia   Alcohol abuse   Thrombocytopenia (HCC)   Cirrhosis of liver (HCC)   Alcohol use  disorder, severe, dependence ([place])   Altered mental state  Acute encephalopathy  - Neurology consulted, suspect Wernicke's encephalopathy, plan admission to [facility] ED, start thiamine 500 mg 3 times a day - obtain EEG per neurology  - His ammonia level is not terribly elevated, however we'll start lactulose in case it is a hepatic encephalopathy component to it - TSH normal - Patient underwent an MRI of the brain on 03/20/2015 without any acute intracranial abnormalities identified    Alcohol abuse  - continue CIWA protocol  - It is unclear whether he is still drinking and how much, he does not appear to be in withdrawal currently.  - LFT is not elevated   Liver cirrhosis - Continue home medications,  - He has normal renal function, normal bilirubin  - obtain INR  Thrombocytopenia  - Due to liver disease, closely monitor   Tobacco abuse  - Nicotine patch   Chronic anemia - Hb is stable, no clinical evidence of GI bleed, closely monitor   Diet: soft diet  Fluids: none  DVT Prophylaxis: SCDs   Code Status: Full code   Family Communication: no family at bedside   Disposition Plan: Admit to [place]. Cruzita Lederer, MD [facility] Pager [phone]  If 7PM-7AM, please contact night-coverage [email or web address] Password [email or web address] 03/28/2015, 4:11 PM

## 2015-03-28 NOTE — BH Assessment (Signed)
[place] Assessment Progress Note  The following facilities have been contacted to seek placement for this pt, with results as noted:  Beds available, information sent, decision pending:  [facility] [address] [facility]   At capacity:  [facility] (no male beds) [facility]   Jalene Mullet, [place] Triage Specialist [phone]

## 2015-03-28 NOTE — ED Notes (Signed)
MD at bedside.  [facility] per hospitalist MD

## 2015-03-28 NOTE — ED Provider Notes (Signed)
Neurology consult obtained at request of psychiatrist Dr. Dwyane Dee for altered mental status  Nat Christen, MD 03/28/15 1204

## 2015-03-28 NOTE — ED Notes (Signed)
Family at bedside. 

## 2015-03-29 ENCOUNTER — Inpatient Hospital Stay (HOSPITAL_COMMUNITY): Payer: 59

## 2015-03-29 ENCOUNTER — Encounter (HOSPITAL_COMMUNITY): Payer: Self-pay

## 2015-03-29 DIAGNOSIS — K7031 Alcoholic cirrhosis of liver with ascites: Secondary | ICD-10-CM

## 2015-03-29 MED ORDER — ONDANSETRON HCL 4 MG/2ML IJ SOLN: 4.0000 mg | Freq: Four times a day (QID) | INTRAMUSCULAR | Status: DC | PRN

## 2015-03-29 MED ORDER — SODIUM CHLORIDE 0.9 % IV SOLN
500.0000 mg | Freq: Three times a day (TID) | INTRAVENOUS | Status: AC
  Administered 2015-03-29 – 2015-03-31 (×9): 500 mg via INTRAVENOUS
  Filled 2015-03-29 (×9): qty 5

## 2015-03-29 MED ORDER — HALOPERIDOL LACTATE 5 MG/ML IJ SOLN
2.5000 mg | Freq: Once | INTRAMUSCULAR | Status: AC
  Administered 2015-03-29: 2.5 mg via INTRAVENOUS
  Filled 2015-03-29: qty 1

## 2015-03-29 NOTE — Plan of Care (Signed)
Problem: Safety: Goal: Ability to remain free from injury will improve Outcome: Not Progressing Pt trying to get OOB

## 2015-03-29 NOTE — Progress Notes (Signed)
Subjective: Continues to be confused.   Exam: Filed Vitals:   03/29/15 0534 03/29/15 1210  BP: 107/53 115/76  Pulse: 93 86  Temp: 97.4 F (36.3 C) 97.5 F (36.4 C)  Resp: 16 19   Gen: In bed, NAD Resp: non-labored breathing, no acute distress Abd: soft, nt  Neuro: MS: Awakens easily. Does not know where he is or what month it is. Does know date of birth [id number] Motor: MAEW Sensory:itnact to LT  Pertinent Labs: B12 nml TSH nml  Impression: 60 yo M with altered mental status since an episode concerning for alcohol withdrawal in late November. He had recently quit drinking at that time. With gait disturbance, patient reported diploplia, and confusion with this episode, I do suspect that this represents Wernicke encephalopathy.   He has been receiving PO thiamine, but sometimes this is not enough. I would favor several days of high dose repletion.   1) Thiamine 500mg  IV TID for 3 days.  2) EEG 3) Will continue to follow.   Roland Rack, MD [facility] [phone]  If 7pm- 7am, please page neurology on call as listed in [place].

## 2015-03-29 NOTE — Progress Notes (Signed)
PATIENT DETAILS Name: [name] Age: 60 y.o. Sex: male Date of Birth: 1956/01/20 Admit Date: 03/27/2015 Admitting Physician Costin Karlyne Greenspan, MD ZI:[id number] HENRY, MD  Subjective: Awake-mostly alert-seems confused with some questions-particularly to timing of month/date  Assessment/Plan: Principal Problem:  Suspected Wernicke's encephalopathy: Change Thiamine to intravenous route.  MRI brain 1/5 negative for major abnormalities, UA/chest x-ray negative for infection. NH4 not elevated.EEG negative for seizure activity.Neurology following.  Follow clinical course-remains somewhat confused.  Active Problems:  Alcohol withdrawal: Mildly tremulous this morning, continue Ativan per protocol. Poor historian-somewhat confused-unclear when his last drink was.  Liver cirrhosis: Likely alcoholic, no evidence of decompensation at this time. Continue Aldactone, lactulose.  Thrombocytopenia: Chronic issue-likely secondary to cirrhosis/hypersplenism.  Anemia: No evidence of blood loss-likely anemia of chronic disease.  BPH: Continue Flomax  Tobacco abuse: Continue nicotine patch-counsel when more awake and alert  Disposition: Remain inpatient  Antimicrobial agents  See below  Anti-infectives    None      DVT Prophylaxis: SCD's  Code Status: Full code   Family Communication None at bedside  Procedures: None  CONSULTS:  neurology  Time spent 30 minutes-Greater than 50% of this time was spent in counseling, explanation of diagnosis, planning of further management, and coordination of care.  MEDICATIONS: Scheduled Meds: . lactulose  20 g Oral BID  . LORazepam  0-4 mg Intravenous 4 times per day  . LORazepam  0-4 mg Intravenous Q12H  . LORazepam  0-4 mg Oral 4 times per day  . LORazepam  0-4 mg Oral Q12H  . nicotine  21 mg Transdermal Daily  . QUEtiapine  50 mg Oral BID  . spironolactone  100 mg Oral Daily  . tamsulosin  0.4 mg Oral Daily  .  thiamine IV  500 mg Intravenous TID  . traZODone  50 mg Oral QHS   Continuous Infusions:  PRN Meds:.acetaminophen, alum & mag hydroxide-simeth, LORazepam, ondansetron    PHYSICAL EXAM: Vital signs in last 24 hours: Filed Vitals:   03/28/15 2049 03/28/15 2355 03/29/15 0534 03/29/15 1210  BP: 152/89 131/81 107/53 115/76  Pulse: 95 88 93 86  Temp: 98.3 F (36.8 C) 97.4 F (36.3 C) 97.4 F (36.3 C) 97.5 F (36.4 C)  TempSrc: Oral Oral Oral Oral  Resp: 18 18 16 19   Height:      Weight:      SpO2: 99% 100% 86% 98%    Weight change:  Filed Weights   03/28/15 2048  Weight: 70.3 kg (154 lb 15.7 oz)   Body mass index is 24.27 kg/(m^2).   Gen Exam: Awake, still confused-but alert with clear speech.   Neck: Supple, No JVD.   Chest: B/L Clear.   CVS: S1 S2 Regular, no murmurs.  Abdomen: soft, BS +, non tender, non distended.  Extremities: no edema, lower extremities warm to touch. Neurologic: Non Focal.   Skin: No Rash.   Wounds: N/A.    Intake/Output from previous day:  Intake/Output Summary (Last 24 hours) at 03/29/15 1437 Last data filed at 03/29/15 1406  Gross per 24 hour  Intake    740 ml  Output    800 ml  Net    -60 ml     LAB RESULTS: CBC  Recent Labs Lab 03/27/15 1619  WBC 4.1  HGB 8.7*  HCT 25.7*  PLT 110*  MCV 96.3  MCH 32.6  MCHC 33.9  RDW 15.7*  LYMPHSABS  1.4  MONOABS 0.5  EOSABS 0.1  BASOSABS 0.0    Chemistries   Recent Labs Lab 03/27/15 1619  NA 141  K 4.1  CL 106  CO2 25  GLUCOSE 89  BUN 17  CREATININE 0.93  CALCIUM 9.5    CBG:  Recent Labs Lab 03/27/15 1849  GLUCAP 91    GFR Estimated Creatinine Clearance: 79 mL/min (by C-G formula based on Cr of 0.93).  Coagulation profile No results for input(s): INR, PROTIME in the last 168 hours.  Cardiac Enzymes No results for input(s): CKMB, TROPONINI, MYOGLOBIN in the last 168 hours.  Invalid input(s): CK  Invalid input(s): POCBNP No results for input(s): DDIMER in  the last 72 hours. No results for input(s): HGBA1C in the last 72 hours. No results for input(s): CHOL, HDL, LDLCALC, TRIG, CHOLHDL, LDLDIRECT in the last 72 hours.  Recent Labs  03/28/15 1249  TSH 3.277    Recent Labs  03/28/15 1249  VITAMINB12 712    Recent Labs  03/27/15 1619  LIPASE 15  AMYLASE 86    Urine Studies No results for input(s): UHGB, CRYS in the last 72 hours.  Invalid input(s): UACOL, UAPR, USPG, UPH, UTP, UGL, UKET, UBIL, UNIT, UROB, ULEU, UEPI, UWBC, URBC, UBAC, CAST, UCOM, BILUA  MICROBIOLOGY: No results found for this or any previous visit (from the past 240 hour(s)).  RADIOLOGY STUDIES/RESULTS: Dg Chest 2 View  03/27/2015  CLINICAL DATA:  Confusion, abnormal behavior and disorientation, medical clearance, history cirrhosis, smoking EXAM: CHEST  2 VIEW COMPARISON:  02/14/2015 FINDINGS: Minimal enlargement of cardiac silhouette. Mediastinal contours and pulmonary vascularity normal. Bibasilar atelectasis. Lungs otherwise clear. No infiltrate, pleural effusion or pneumothorax. Prior cervicothoracic fusion. No acute bony findings. IMPRESSION: Minimal enlargement of cardiac silhouette and bibasilar atelectasis. Electronically Signed   By: Lavonia Dana M.D.   On: 03/27/2015 16:23   Mr Brain Wo Contrast  03/20/2015  CLINICAL DATA:  Altered mental status. Increasing confusion and behavioral changes. History of alcohol abuse. EXAM: MRI HEAD WITHOUT CONTRAST TECHNIQUE: Multiplanar, multiecho pulse sequences of the brain and surrounding structures were obtained without intravenous contrast. COMPARISON:  Head CT 02/16/2015 FINDINGS: The examination had to be discontinued prior to completion due to patient's altered mental status and inability to follow instructions. Axial diffusion, FLAIR, T2, and T2* gradient echo images and coronal T2 weighted images were obtained. Some sequences are mildly to moderately motion degraded, particularly the gradient echo sequence. There is  extensive magnetic susceptibility artifact from a superficial metallic foreign body/BB inferior to the patient's nose. This obscures the anterior frontal lobes and portion of the posterior fossa including the brainstem on diffusion-weighted and gradient echo images. There is no evidence of acute infarct involving the portions of the brain that can be evaluated on the diffusion sequence. No gross intracranial hemorrhage is identified, with artifactual FLAIR sulcal hyperintensity in the anterior frontal lobes. There is mild generalized cerebral atrophy. Several punctate foci of T2 hyperintensity in the cerebral white matter are nonspecific and not greater than expected for patient's age. No gross abnormality is identified in the orbits. The visualized paranasal paranasal sinuses are clear. Left mastoid is under pneumatized. Major intracranial vascular flow voids are preserved. IMPRESSION: 1. Incomplete examination with motion and magnetic susceptibility artifact as above. 2. No acute intracranial abnormality identified. 3. Mildly advanced cerebral atrophy for age. Electronically Signed   By: Logan Bores M.D.   On: 03/20/2015 16:26    Oren Binet, MD  [facility] Pager:[phone]  If  7PM-7AM, please contact night-coverage [email or web address] Password [email or web address] 03/29/2015, 2:37 PM   LOS: 1 day

## 2015-03-29 NOTE — Progress Notes (Signed)
EEG completed, results pending. 

## 2015-03-29 NOTE — Procedures (Signed)
History: 60 year old male with encephalopathy  Sedation: None  Technique: This is a 21 channel routine scalp EEG performed at the bedside with bipolar and monopolar montages arranged in accordance to the international 10/20 system of electrode placement. One channel was dedicated to EKG recording.    Background: The vast majority of this EEG is performed during sleep with normal-appearing sleep structures. Towards the end of the recording there is a brief period of awakening during which there is a poorly sustained posterior dominant rhythm of 7 Hz. there is also mild irregular generalized slow activity, predominantly delta range.   Photic stimulation: Physiologic driving is not performed  EEG Abnormalities: 1) generalized irregular slow activity 2) slow PDR  Clinical Interpretation: This EEG is consistent with a generalized non-specific cerebral dysfunction(encephalopathy). There was no seizure or seizure predisposition recorded on this study.   Roland Rack, MD [facility] [phone]  If 7pm- 7am, please page neurology on call as listed in [place].

## 2015-03-29 NOTE — Progress Notes (Signed)
Pt  Is confused , unaware of surroundings, and has been getting up constantly. Pt is  reorientated to surroundings multiples times with failed attempts. sch and prn meds has been less effective for agitation. On-call provider Lamar Blinks, NP has been notified via text page.

## 2015-03-30 LAB — COMPREHENSIVE METABOLIC PANEL
ALBUMIN: 2.9 g/dL — AB (ref 3.5–5.0)
ALK PHOS: 68 U/L (ref 38–126)
ALT: 15 U/L — AB (ref 17–63)
AST: 31 U/L (ref 15–41)
Anion gap: 7 (ref 5–15)
BUN: 12 mg/dL (ref 6–20)
CALCIUM: 10.2 mg/dL (ref 8.9–10.3)
CO2: 26 mmol/L (ref 22–32)
CREATININE: 0.95 mg/dL (ref 0.61–1.24)
Chloride: 104 mmol/L (ref 101–111)
GFR calc non Af Amer: >60 mL/min (ref >60)
GLUCOSE: 101 mg/dL — AB (ref 65–99)
Potassium: 3.7 mmol/L (ref 3.5–5.1)
SODIUM: 137 mmol/L (ref 135–145)
Total Bilirubin: 0.8 mg/dL (ref 0.3–1.2)
Total Protein: 7.9 g/dL (ref 6.5–8.1)

## 2015-03-30 LAB — CBC
HCT: 30.9 % — ABNORMAL LOW (ref 39.0–52.0)
HEMOGLOBIN: 10.7 g/dL — AB (ref 13.0–17.0)
MCH: 32.4 pg (ref 26.0–34.0)
MCHC: 34.6 g/dL (ref 30.0–36.0)
MCV: 93.6 fL (ref 78.0–100.0)
Platelets: 104 10*3/uL — ABNORMAL LOW (ref 150–400)
RBC: 3.3 MIL/uL — AB (ref 4.22–5.81)
RDW: 15.3 % (ref 11.5–15.5)
WBC: 3.4 10*3/uL — AB (ref 4.0–10.5)

## 2015-03-30 NOTE — Progress Notes (Signed)
Exam is limited by the patient's cooperation and insistence on needing to poop.  He is awake, alert, able to speak but with slurred speech. He is in restraints.   Impression: 60 yo M with altered mental status since an episode concerning for alcohol withdrawal in late November. He had recently quit drinking at that time. With gait disturbance, patient reported diploplia, and confusion with this episode, I do suspect that this represents Wernicke encephalopathy.   I do not suspect that he will return to normal, however if he does not have any improvement that may be reasonable to sample CSF to assess for evidence of inflammation such as pleocytosis or elevated IgG index. I suspect, however, that this will be fairly low yield.  For now, however, would continue IV thiamine.  1) IV thiamine 500 mg IV 3 times a day, completing day two today 2) neurology will continue to follow  Roland Rack, MD [facility] [phone]  If 7pm- 7am, please page neurology on call as listed in [place].

## 2015-03-30 NOTE — Progress Notes (Signed)
PATIENT DETAILS Name: [name] Age: 60 y.o. Sex: male Date of Birth: 1955/06/06 Admit Date: 03/27/2015 Admitting Physician Costin Karlyne Greenspan, MD ZI:[id number] [name], MD  Subjective: Awake-more confused this am  Assessment/Plan: Principal Problem: Suspected Wernicke's encephalopathy: Continue Thiamine.  MRI brain 1/5 negative for major abnormalities, UA/chest x-ray negative for infection. NH4 not elevated.EEG negative for seizure activity.Neurology following.Follow clinical course-remains somewhat confused-likely ETOH withdrawl  Active Problems: Alcohol withdrawal: More confused, and continue to be tremulous this morning, continue Ativan per protocol. Poor historian-somewhat confused-unclear when his last drink was-however her spouse-he has not had a drink in the past 2 months.  Liver cirrhosis: Likely alcoholic, no evidence of decompensation at this time. Continue Aldactone, lactulose.  Thrombocytopenia: Chronic issue-likely secondary to cirrhosis/hypersplenism.  Anemia: No evidence of blood loss-likely anemia of chronic disease.  BPH: Continue Flomax  Tobacco abuse: Continue nicotine patch-counsel when more awake and alert  Disposition: Remain inpatient-suspect will require SNF  Antimicrobial agents  See below  Anti-infectives    None      DVT Prophylaxis: SCD's  Code Status: Full code   Family Communication Spouse over the phone  Procedures: None  CONSULTS:  neurology  Time spent 30 minutes-Greater than 50% of this time was spent in counseling, explanation of diagnosis, planning of further management, and coordination of care.  MEDICATIONS: Scheduled Meds: . lactulose  20 g Oral BID  . LORazepam  0-4 mg Oral Q12H  . nicotine  21 mg Transdermal Daily  . QUEtiapine  50 mg Oral BID  . spironolactone  100 mg Oral Daily  . tamsulosin  0.4 mg Oral Daily  . thiamine IV  500 mg Intravenous TID  . traZODone  50 mg Oral QHS    Continuous Infusions:  PRN Meds:.acetaminophen, alum & mag hydroxide-simeth, LORazepam, ondansetron, ondansetron    PHYSICAL EXAM: Vital signs in last 24 hours: Filed Vitals:   03/29/15 1210 03/29/15 2103 03/30/15 0418 03/30/15 1321  BP: 115/76 119/68 107/73 98/67  Pulse: 86 88 89 92  Temp: 97.5 F (36.4 C) 97.9 F (36.6 C) 97.9 F (36.6 C) 98.1 F (36.7 C)  TempSrc: Oral Oral Oral Oral  Resp: 19 18 16 18   Height:      Weight:      SpO2: 98% 100% 100% 97%    Weight change:  Filed Weights   03/28/15 2048  Weight: 70.3 kg (154 lb 15.7 oz)   Body mass index is 24.27 kg/(m^2).   Gen Exam: Awake,somewhat confused-but follows some commands. Knows he is in the hospital-but not oriented to time  Neck: Supple, No JVD.   Chest: B/L Clear.   CVS: S1 S2 Regular, no murmurs.  Abdomen: soft, BS +, non tender, non distended.  Extremities: no edema, lower extremities warm to touch. Neurologic: Non Focal.   Skin: No Rash.   Wounds: N/A.    Intake/Output from previous day:  Intake/Output Summary (Last 24 hours) at 03/30/15 1342 Last data filed at 03/30/15 1009  Gross per 24 hour  Intake    720 ml  Output   1250 ml  Net   -530 ml     LAB RESULTS: CBC  Recent Labs Lab 03/27/15 1619 03/30/15 0615  WBC 4.1 3.4*  HGB 8.7* 10.7*  HCT 25.7* 30.9*  PLT 110* 104*  MCV 96.3 93.6  MCH 32.6 32.4  MCHC 33.9 34.6  RDW 15.7* 15.3  LYMPHSABS 1.4  --  MONOABS 0.5  --   EOSABS 0.1  --   BASOSABS 0.0  --     Chemistries   Recent Labs Lab 03/27/15 1619 03/30/15 0615  NA 141 137  K 4.1 3.7  CL 106 104  CO2 25 26  GLUCOSE 89 101*  BUN 17 12  CREATININE 0.93 0.95  CALCIUM 9.5 10.2    CBG:  Recent Labs Lab 03/27/15 1849  GLUCAP 91    GFR Estimated Creatinine Clearance: 77.3 mL/min (by C-G formula based on Cr of 0.95).  Coagulation profile No results for input(s): INR, PROTIME in the last 168 hours.  Cardiac Enzymes No results for input(s): CKMB,  TROPONINI, MYOGLOBIN in the last 168 hours.  Invalid input(s): CK  Invalid input(s): POCBNP No results for input(s): DDIMER in the last 72 hours. No results for input(s): HGBA1C in the last 72 hours. No results for input(s): CHOL, HDL, LDLCALC, TRIG, CHOLHDL, LDLDIRECT in the last 72 hours.  Recent Labs  03/28/15 1249  TSH 3.277    Recent Labs  03/28/15 1249  VITAMINB12 712    Recent Labs  03/27/15 1619  LIPASE 15  AMYLASE 86    Urine Studies No results for input(s): UHGB, CRYS in the last 72 hours.  Invalid input(s): UACOL, UAPR, USPG, UPH, UTP, UGL, UKET, UBIL, UNIT, UROB, ULEU, UEPI, UWBC, URBC, UBAC, CAST, UCOM, BILUA  MICROBIOLOGY: No results found for this or any previous visit (from the past 240 hour(s)).  RADIOLOGY STUDIES/RESULTS: Dg Chest 2 View  03/27/2015  CLINICAL DATA:  Confusion, abnormal behavior and disorientation, medical clearance, history cirrhosis, smoking EXAM: CHEST  2 VIEW COMPARISON:  02/14/2015 FINDINGS: Minimal enlargement of cardiac silhouette. Mediastinal contours and pulmonary vascularity normal. Bibasilar atelectasis. Lungs otherwise clear. No infiltrate, pleural effusion or pneumothorax. Prior cervicothoracic fusion. No acute bony findings. IMPRESSION: Minimal enlargement of cardiac silhouette and bibasilar atelectasis. Electronically Signed   By: Lavonia Dana M.D.   On: 03/27/2015 16:23   Mr Brain Wo Contrast  03/20/2015  CLINICAL DATA:  Altered mental status. Increasing confusion and behavioral changes. History of alcohol abuse. EXAM: MRI HEAD WITHOUT CONTRAST TECHNIQUE: Multiplanar, multiecho pulse sequences of the brain and surrounding structures were obtained without intravenous contrast. COMPARISON:  Head CT 02/16/2015 FINDINGS: The examination had to be discontinued prior to completion due to patient's altered mental status and inability to follow instructions. Axial diffusion, FLAIR, T2, and T2* gradient echo images and coronal T2  weighted images were obtained. Some sequences are mildly to moderately motion degraded, particularly the gradient echo sequence. There is extensive magnetic susceptibility artifact from a superficial metallic foreign body/BB inferior to the patient's nose. This obscures the anterior frontal lobes and portion of the posterior fossa including the brainstem on diffusion-weighted and gradient echo images. There is no evidence of acute infarct involving the portions of the brain that can be evaluated on the diffusion sequence. No gross intracranial hemorrhage is identified, with artifactual FLAIR sulcal hyperintensity in the anterior frontal lobes. There is mild generalized cerebral atrophy. Several punctate foci of T2 hyperintensity in the cerebral white matter are nonspecific and not greater than expected for patient's age. No gross abnormality is identified in the orbits. The visualized paranasal paranasal sinuses are clear. Left mastoid is under pneumatized. Major intracranial vascular flow voids are preserved. IMPRESSION: 1. Incomplete examination with motion and magnetic susceptibility artifact as above. 2. No acute intracranial abnormality identified. 3. Mildly advanced cerebral atrophy for age. Electronically Signed   By: Seymour Bars.D.  On: 03/20/2015 16:26    Oren Binet, MD  [facility] Pager:[phone]  If 7PM-7AM, please contact night-coverage [email or web address] Password [email or web address] 03/30/2015, 1:42 PM   LOS: 2 days

## 2015-03-30 NOTE — Progress Notes (Signed)
Report given to Esther, RN.

## 2015-03-31 LAB — COMPREHENSIVE METABOLIC PANEL
ALBUMIN: 3 g/dL — AB (ref 3.5–5.0)
ALT: 14 U/L — AB (ref 17–63)
AST: 32 U/L (ref 15–41)
Alkaline Phosphatase: 69 U/L (ref 38–126)
Anion gap: 5 (ref 5–15)
BUN: 16 mg/dL (ref 6–20)
CHLORIDE: 106 mmol/L (ref 101–111)
CO2: 23 mmol/L (ref 22–32)
CREATININE: 1.17 mg/dL (ref 0.61–1.24)
Calcium: 9.9 mg/dL (ref 8.9–10.3)
GFR calc non Af Amer: >60 mL/min (ref >60)
GLUCOSE: 124 mg/dL — AB (ref 65–99)
Potassium: 4.1 mmol/L (ref 3.5–5.1)
SODIUM: 134 mmol/L — AB (ref 135–145)
Total Bilirubin: 1.1 mg/dL (ref 0.3–1.2)
Total Protein: 7.7 g/dL (ref 6.5–8.1)

## 2015-03-31 LAB — AMMONIA: AMMONIA: 45 umol/L — AB (ref 9–35)

## 2015-03-31 LAB — VITAMIN B1: Vitamin B1 (Thiamine): 157.9 nmol/L (ref 66.5–200.0)

## 2015-03-31 MED ORDER — LORAZEPAM 2 MG/ML IJ SOLN
1.0000 mg | Freq: Four times a day (QID) | INTRAMUSCULAR | Status: DC | PRN
  Administered 2015-04-01 – 2015-04-03 (×3): 1 mg via INTRAVENOUS
  Filled 2015-03-31 (×3): qty 1

## 2015-03-31 MED ORDER — LACTULOSE 10 GM/15ML PO SOLN
20.0000 g | Freq: Three times a day (TID) | ORAL | Status: DC
  Administered 2015-03-31 – 2015-04-03 (×8): 20 g via ORAL
  Filled 2015-03-31 (×8): qty 30

## 2015-03-31 NOTE — Progress Notes (Signed)
[facility] PROGRESS NOTE  [name] [id number] DOB: [birth year] DOA: 03/27/2015 PCP: Woody Seller, MD  Subjective: Confused this a.m. but answered some questions appropriately, appeared drowsy (given ativan earlier this am)  Assessment/Plan: Principal Problem: Suspected Wernicke's encephalopathy: Continue Thiamine. MRI brain 1/5 negative for major abnormalities, UA/chest x-ray negative for infection. Ammonia mildly elevated. EEG negative for seizure activity. Per spouse-he has not had a drink in the past 2 months-unfortunately no improvement in spite of receiving multiple doses of IV thiamine. Neurology following. Will consult psychiatry to help and manage patient's persistent delirium/confusion.  Active Problems: Liver cirrhosis: Likely alcoholic, no evidence of decompensation at this time. Continue Aldactone, lactulose-but change to TID.  Thrombocytopenia: Chronic issue-likely secondary to cirrhosis/hypersplenism. Hold off on starting DVT prophylaxis-repeat CBC in a.m.  Anemia: No evidence of blood loss-likely anemia of chronic disease.  BPH: Continue Flomax  Tobacco abuse: Continue nicotine patch-counsel when more awake and alert  Code Status: Full code Family Communication: None at bedside Disposition Plan: Remain inpatient-suspect will require SNF  Consultants: None  Procedures: None  Antibiotics: None  Objective: Filed Vitals:   03/31/15 0041 03/31/15 0630  BP: 103/73 109/75  Pulse: 106 87  Temp: 98.3 F (36.8 C) 97.5 F (36.4 C)  Resp: 18 18    Intake/Output Summary (Last 24 hours) at 03/31/15 1311 Last data filed at 03/31/15 0630  Gross per 24 hour  Intake    370 ml  Output    550 ml  Net   -180 ml   Filed Weights   03/28/15 2048  Weight: 70.3 kg (154 lb 15.7 oz)    Exam:   General:  Awake but drowsy appearing, able to answer some questions appropriately  Cardiovascular: S1, S2 regular, no murmurs  Respiratory: CTA bilaterally    Abdomen: soft, non-tender  Neuro: generalized weakness-but non focal  Data Reviewed: Basic Metabolic Panel:  Recent Labs Lab 03/27/15 1619 03/30/15 0615 03/31/15 0638  NA 141 137 134*  K 4.1 3.7 4.1  CL 106 104 106  CO2 25 26 23   GLUCOSE 89 101* 124*  BUN 17 12 16   CREATININE 0.93 0.95 1.17  CALCIUM 9.5 10.2 9.9   Liver Function Tests:  Recent Labs Lab 03/27/15 1619 03/30/15 0615 03/31/15 0638  AST 29 31 32  ALT 14* 15* 14*  ALKPHOS 76 68 69  BILITOT 0.8 0.8 1.1  PROT 7.5 7.9 7.7  ALBUMIN 3.3* 2.9* 3.0*    Recent Labs Lab 03/27/15 1619  LIPASE 15  AMYLASE 86    Recent Labs Lab 03/27/15 1619 03/31/15 0630  AMMONIA 39* 45*   CBC:  Recent Labs Lab 03/27/15 1619 03/30/15 0615  WBC 4.1 3.4*  NEUTROABS 2.1  --   HGB 8.7* 10.7*  HCT 25.7* 30.9*  MCV 96.3 93.6  PLT 110* 104*   Cardiac Enzymes: No results for input(s): CKTOTAL, CKMB, CKMBINDEX, TROPONINI in the last 168 hours. BNP (last 3 results)  Recent Labs  10/10/14 1702  BNP 120.9*    ProBNP (last 3 results) No results for input(s): PROBNP in the last 8760 hours.  CBG:  Recent Labs Lab 03/27/15 1849  GLUCAP 91    No results found for this or any previous visit (from the past 240 hour(s)).   Studies: No results found.  Scheduled Meds: . lactulose  20 g Oral BID  . LORazepam  0-4 mg Oral Q12H  . nicotine  21 mg Transdermal Daily  . QUEtiapine  50 mg Oral BID  . spironolactone  100 mg Oral Daily  . tamsulosin  0.4 mg Oral Daily  . thiamine IV  500 mg Intravenous TID  . traZODone  50 mg Oral QHS   Continuous Infusions: None  Time spent: 35 minutes  Signed: Carlena Hurl, PA-S  [facility] If 7PM-7AM, please contact night-coverage at [email or web address], password [facility] 03/31/2015, 1:11 PM  LOS: 3 days    Attending MD note  Patient was seen, examined,treatment plan was discussed with the PA-S.  I have personally reviewed the clinical findings, lab, imaging studies and  management of this patient in detail. I agree with the documentation, as recorded by the PA-S.   Continues to be very confused-not much improvement since admission. Continue IV thiamine. Ammonia minimally elevated-doubt can account for this much of confusion/encephalopathy. Per spouse-she is sure that the patient has not had a drink for the past few months-discontinue IV Ativan per protocol-changed to IV Ativan as needed. Have asked RN to minimize restraints as much as possible. Psych consulted for medication management/assistance.   [facility]

## 2015-03-31 NOTE — Progress Notes (Signed)
Awake this am agitated an Lab tech in to draw am lab at   Ativan given 1 mg

## 2015-03-31 NOTE — Clinical Social Work Note (Signed)
Clinical Social Work Assessment  Patient Details  Name: [name] MRN: [id number] Date of Birth: 02/17/1956  Date of referral:  03/31/15               Reason for consult:  Facility Placement                Permission sought to share information with:  Facility Sport and exercise psychologist, Family Supports Permission granted to share information::  No (Patient disoriented; completed assessment w/ wife)  Name::     [place]::  [facility] SNFs  Relationship::  Spouse  Contact Information:  [phone]  Housing/Transportation Living arrangements for the past 2 months:  [place] of Information:  Spouse Patient Interpreter Needed:  None Criminal Activity/Legal Involvement Pertinent to Current Situation/Hospitalization:  No - Comment as needed Significant Relationships:  Spouse Lives with:  Spouse Do you feel safe going back to the place where you live?  No Need for family participation in patient care:  Yes (Comment)  Care giving concerns:  CSW received referral for possible SNF placement at time of discharge. Patient is disoriented. CSW spoke with patient's wife regarding PT recommendation of SNF placement at time of discharge. Per patient's wife, patient's wife is currently unable to care for patient at their home given patient's current physical needs and fall risk. Patient's wife expressed understanding of PT recommendation and is agreeable to SNF placement at time of discharge. CSW to continue to follow and assist with discharge planning needs.   Social Worker assessment / plan:  CSW spoke with patient's wife concerning possibility of rehab at [facility] before returning home.  Employment status:  Retired Surveyor, [employer] Care PT Recommendations:  [place] / Referral to community resources:  [place]  Patient/Family's Response to care:  Patient's wife recognize need for rehab before returning home and  are agreeable to a SNF in [place]. Patient reported preference for [facility]  Patient/Family's Understanding of and Emotional Response to Diagnosis, Current Treatment, and Prognosis:  Patient's wife is realistic regarding therapy needs. No questions/concerns about plan or treatment.    Emotional Assessment Appearance:  Appears stated age Attitude/Demeanor/Rapport:   (Disoriented) Affect (typically observed):   (Disoriented) Orientation:    Alcohol / Substance use:  Alcohol Use (Stopped 2 months ago) Psych involvement (Current and /or in the community):  No (Comment)  Discharge Needs  Concerns to be addressed:  Care Coordination Readmission within the last 30 days:  No Current discharge risk:  None Barriers to Discharge:  Continued Medical Work up   [employer], [place] 03/31/2015, 5:51 PM

## 2015-03-31 NOTE — Evaluation (Signed)
Physical Therapy Evaluation Patient Details Name: [name] MRN: [id number] DOB: 1955/12/17 Today's Date: 03/31/2015   History of Present Illness  [name] is a 60 y.o. male has a past medical history significant for alcohol abuse, liver cirrhosis, anemia, pancytopenia, polysubstance abuse, admitted with probable Wernicke's encephalopathy.  Clinical Impression  Pt admitted with above diagnosis. Pt currently with functional limitations due to the deficits listed below (see PT Problem List).  Pt will benefit from skilled PT to increase their independence and safety with mobility to allow discharge to the venue listed below.  Pt with ataxic gait and difficulty following instructions.  Requires multi-modal cuieng for safe mobility.  Recommend SNF for short term rehab.     Follow Up Recommendations SNF    Equipment Recommendations  None recommended by PT    Recommendations for Other Services       Precautions / Restrictions Precautions Precautions: Fall Restrictions Weight Bearing Restrictions: No      Mobility  Bed Mobility Overal bed mobility: Needs Assistance Bed Mobility: Supine to Sit     Supine to sit: Min assist        Transfers Overall transfer level: Needs assistance Equipment used: Rolling walker (2 wheeled) Transfers: Sit to/from Stand Sit to Stand: Min assist         General transfer comment: tactile cueing for hand placement. In standing, wanting to take hands off or put on incorrect part of the RW  Ambulation/Gait Ambulation/Gait assistance: Min assist;Mod assist Ambulation Distance (Feet): 20 Feet   Gait Pattern/deviations: Ataxic;Narrow base of support;Drifts right/left;Decreased step length - right;Decreased step length - left     General Gait Details: Pt with ataxic pattern with MIN A with straight line and MOD A with turning.  In standing, attempted marching and difficulty with weight shifts and hard to clear foot off of  floor  Stairs            Wheelchair Mobility    Modified Rankin (Stroke Patients Only)       Balance Overall balance assessment: Needs assistance   Sitting balance-Leahy Scale: Fair       Standing balance-Leahy Scale: Poor Standing balance comment: requires UE support                             Pertinent Vitals/Pain Pain Assessment: Faces Faces Pain Scale: No hurt    Home Living Family/patient expects to be discharged to:: Private residence Living Arrangements: Spouse/significant other Available Help at Discharge: Family;Available PRN/intermittently Type of Home: Apartment Home Access: Level entry       Home Equipment: Cane - single point;Bedside commode;Shower seat;Walker - 2 wheels Additional Comments: Unsure of accuracy of information.  Different than in EPIC from last admission last month.    Prior Function                 Hand Dominance        Extremity/Trunk Assessment   Upper Extremity Assessment: Overall WFL for tasks assessed           Lower Extremity Assessment: Generalized weakness         Communication      Cognition Arousal/Alertness: Awake/alert;Lethargic   Overall Cognitive Status: Impaired/Different from baseline Area of Impairment: Safety/judgement;Awareness;Problem solving;Attention;Following commands   Current Attention Level: Focused Memory: Decreased short-term memory Following Commands: Follows one step commands inconsistently;Follows one step commands with increased time Safety/Judgement: Decreased awareness of safety Awareness: Intellectual Problem Solving: Slow processing;Difficulty  sequencing;Decreased initiation;Requires verbal cues;Requires tactile cues      General Comments General comments (skin integrity, edema, etc.): Pt whispering at times and difficult to understand.    Exercises        Assessment/Plan    PT Assessment Patient needs continued PT services  PT Diagnosis  Abnormality of gait;Generalized weakness   PT Problem List    PT Treatment Interventions Gait training;DME instruction;Functional mobility training;Therapeutic activities;Therapeutic exercise;Balance training   PT Goals (Current goals can be found in the Care Plan section) Acute Rehab PT Goals PT Goal Formulation: Patient unable to participate in goal setting Time For Goal Achievement: 04/14/15 Potential to Achieve Goals: Good    Frequency Min 3X/week   Barriers to discharge Decreased caregiver support per old notes, wife works. Pt states he only lives with wife.    Co-evaluation               End of Session Equipment Utilized During Treatment: Gait belt Activity Tolerance: Patient tolerated treatment well;Patient limited by fatigue Patient left: in bed;with call bell/phone within reach;with bed alarm set Nurse Communication: Mobility status         Time: [id number] PT Time Calculation (min) (ACUTE ONLY): 22 min   Charges:   PT Evaluation $PT Eval Moderate Complexity: 1 Procedure     PT G Codes:        Karlina Suares LUBECK 03/31/2015, 11:31 AM

## 2015-03-31 NOTE — Progress Notes (Signed)
Patient awake and calm at this time. Wrist restraints and posey removed. Patient remains confused, bed in lowest position and bed alarm on.

## 2015-03-31 NOTE — Progress Notes (Signed)
Received pt in bed very restless ,confused and agitated  trying to  Get OOB, pt reposition and offered po juice and banana, tolerated well  Skin assesement done per restraint protocol. slight redness to biil wrist..capillary refill and circulation intact. CIWA 6 and 1 mg ativan given for agitation a t 0105 pt slept  7 quiet for a while and now awake  RN will continue to monitor Pt. Patient Lines/Drains/Airways Status   Active Line/Drains/Airways    Name:   Placement date:   Placement time:   Site:   Days:   Peripheral IV 03/28/15 Right Hand  03/28/15   2144   Hand   3   External Urinary Catheter  03/28/15   2200      3

## 2015-03-31 NOTE — NC FL2 (Signed)
[place] LEVEL OF CARE SCREENING TOOL     IDENTIFICATION  Patient Name: [name] Birthdate: 02-Nov-1955 Sex: male Admission Date (Current Location): 03/27/2015  [facility] [place] Number:  [employer] and Address:         Provider Number: [id number]  Attending Physician Name and Address:  Jonetta Osgood, MD  Relative Name and Phone Number:  Tennis Must, [phone]    Current Level of Care: Hospital Recommended Level of Care: [place] Prior Approval Number:    Date Approved/Denied:   PASRR Number: [id number]  Discharge Plan: SNF    Current Diagnoses: Patient Active Problem List   Diagnosis Date Noted  . Altered mental state 03/28/2015  . Acute encephalopathy 03/28/2015  . Alcohol use disorder, severe, dependence ([place]) 02/16/2015  . Alcohol use with intoxication delirium ([place]) 02/16/2015  . Alcohol use with alcohol-induced psychotic disorder ([place]) 02/16/2015  . Encephalopathy 02/12/2015  . Hyponatremia 02/12/2015  . AKI (acute kidney injury) ([place]) 02/12/2015  . Alcoholic cirrhosis of liver with ascites ([place])   . Acute blood loss anemia   . Upper GI bleed   . Thrombocytopenia ([place]) 10/10/2014  . GI bleed 10/10/2014  . Cirrhosis of liver ([place]) 10/10/2014  . Pulmonary edema 10/10/2014  . Pancytopenia ([place]) 10/10/2014  . Cellulitis 07/29/2014  . Scrotal swelling 07/29/2014  . Anemia 04/12/2008  . Alcohol abuse 04/12/2008  . GERD 04/12/2008  . PANCREATITIS 04/12/2008  . Blood in stool 04/12/2008  . Other malaise and fatigue 04/12/2008  . HEMOPTYSIS 04/12/2008  . NAUSEA AND VOMITING 04/12/2008  . EPIGASTRIC PAIN 04/12/2008  . ALCOHOLIC HEPATITIS, HX OF 04/12/2008    Orientation RESPIRATION BLADDER Height & Weight     (Disoriented)  Normal Incontinent, External catheter (Condom catheter)   154 lbs.  BEHAVIORAL SYMPTOMS/MOOD NEUROLOGICAL BOWEL NUTRITION STATUS      Continent  (Please see DC summary)   AMBULATORY STATUS COMMUNICATION OF NEEDS Skin   Extensive Assist Verbally Normal                       Personal Care Assistance Level of Assistance  Bathing, Feeding, Dressing Bathing Assistance: Maximum assistance Feeding assistance: Limited assistance Dressing Assistance: Maximum assistance     Functional Limitations Info             SPECIAL CARE FACTORS FREQUENCY  PT (By licensed PT)     PT Frequency: 5x/week              Contractures      Additional Factors Info  Code Status, Allergies, Psychotropic Code Status Info: Full Allergies Info: Aspirin Psychotropic Info: Seroquil; Trazadone         Current Medications (03/31/2015):  This is the current hospital active medication list Current Facility-Administered Medications  Medication Dose Route Frequency Provider Last Rate Last Dose  . acetaminophen (TYLENOL) tablet 650 mg  650 mg Oral Q4H PRN Shawn C Joy, PA-C      . alum & mag hydroxide-simeth (MAALOX/MYLANTA) 200-200-20 MG/5ML suspension 30 mL  30 mL Oral PRN Shawn C Joy, PA-C      . lactulose (CHRONULAC) 10 GM/15ML solution 20 g  20 g Oral TID Jonetta Osgood, MD   20 g at 03/31/15 1540  . LORazepam (ATIVAN) injection 1 mg  1 mg Intravenous Q6H PRN Shanker Kristeen Mans, MD      . nicotine (NICODERM CQ - dosed in mg/24 hours) patch 21 mg  21 mg Transdermal  Daily Lorayne Bender, PA-C   21 mg at 03/31/15 [id number]  . ondansetron (ZOFRAN) injection 4 mg  4 mg Intravenous Q6H PRN Jeryl Columbia, NP      . ondansetron (ZOFRAN) tablet 4 mg  4 mg Oral Q8H PRN Shawn C Joy, PA-C      . QUEtiapine (SEROQUEL) tablet 50 mg  50 mg Oral BID Mojeed Akintayo   50 mg at 03/31/15 0926  . spironolactone (ALDACTONE) tablet 100 mg  100 mg Oral Daily Shawn C Joy, PA-C   100 mg at 03/31/15 [id number]  . tamsulosin (FLOMAX) capsule 0.4 mg  0.4 mg Oral Daily Shawn C Joy, PA-C   0.4 mg at 03/31/15 0926  . thiamine 500mg  in normal saline (65ml) IVPB  500 mg Intravenous TID Jonetta Osgood,  MD   500 mg at 03/31/15 1539  . traZODone (DESYREL) tablet 50 mg  50 mg Oral QHS Shawn C Joy, PA-C   50 mg at 03/30/15 2200     Discharge Medications: Please see discharge summary for a list of discharge medications.  Relevant Imaging Results:  Relevant Lab Results:   Additional Information SSN: [id number]  Benard Halsted, LCSWA

## 2015-03-31 NOTE — Consult Note (Signed)
[facility] Face-to-Face Psychiatry Consult   Reason for Consult:  AMS and alcohol withdrawal delirium Referring Physician:  Dr. Sloan Leiter Patient Identification: [name] MRN:  [id number] Principal Diagnosis: Alcohol use disorder, severe, dependence ([place]) Diagnosis:   Patient Active Problem List   Diagnosis Date Noted  . Altered mental state [R41.82] 03/28/2015  . Acute encephalopathy [G93.40] 03/28/2015  . Alcohol use disorder, severe, dependence ([place]) [F10.20] 02/16/2015  . Alcohol use with intoxication delirium ([place]) [F10.921] 02/16/2015  . Alcohol use with alcohol-induced psychotic disorder ([place]) [F10.959] 02/16/2015  . Encephalopathy [G93.40] 02/12/2015  . Hyponatremia [E87.1] 02/12/2015  . AKI (acute kidney injury) ([place]) [N17.9] 02/12/2015  . Alcoholic cirrhosis of liver with ascites ([place]) [K70.31]   . Acute blood loss anemia [D62]   . Upper GI bleed [K92.2]   . Thrombocytopenia ([place]) [D69.6] 10/10/2014  . GI bleed [K92.2] 10/10/2014  . Cirrhosis of liver ([place]) [K74.60] 10/10/2014  . Pulmonary edema [J81.1] 10/10/2014  . Pancytopenia ([place]) [H70.263] 10/10/2014  . Cellulitis [L03.90] 07/29/2014  . Scrotal swelling [N50.89] 07/29/2014  . Anemia [D64.9] 04/12/2008  . Alcohol abuse [F10.10] 04/12/2008  . GERD [K21.9] 04/12/2008  . PANCREATITIS [K85.90] 04/12/2008  . Blood in stool [K92.1] 04/12/2008  . Other malaise and fatigue [R53.81, R53.83] 04/12/2008  . HEMOPTYSIS [786.3] 04/12/2008  . NAUSEA AND VOMITING [R11.2] 04/12/2008  . EPIGASTRIC PAIN [R10.13] 04/12/2008  . ALCOHOLIC HEPATITIS, HX OF [Z87.19] 04/12/2008    Total Time spent with patient: 45 minutes  Subjective:   [name] is a 60 y.o. male patient admitted with altered mental status.  HPI:  [name] is a 60 y.o. male has a past medical history significant for alcohol abuse, liver cirrhosis, anemia, pancytopenia, polysubstance abuse, admitted to [facility] with altered mental status. Psychiatric  face-to-face consultation evaluation requested for increased confusion, altered mental status secondary to chronic alcoholism. Patient was initially evaluated at the behavioral health unit in the emergency room at [facility]. Patient is a poor historian and has no family members at bedside and he appeared confused, slurred speech, knows his name at this time. Patient currently psychiatric evaluation is limited by altered mental status.   Please review the following information for additional information: Basically patient was admitted with acute metabolic encephalopathy in the setting of active alcohol abuse in December 2016, improved and discharged home at that time. Since he has been home, he continued to be confused and never really improved, with intermittent hallucinations, odd behavior, poor appetite as well as altered sleeping habits. Apparently he hasn't been drinking alcohol since per his wife, however patient does state that he is still drinking. Patient was admitted here on 1/12, he has been encephalopathic. Neurology was consulted and evaluated patient in the ED, and they're concerned about work is encephalopathy, recommended high-dose thiamine and admission over to [facility].   Past Psychiatric History: none reported  Risk to Self: Suicidal Ideation: No Suicidal Intent: No Is patient at risk for suicide?: No Suicidal Plan?: No Access to Means: No What has been your use of drugs/alcohol within the last 12 months?: Alcohol  How many times?:  (Unable to assess) Other Self Harm Risks:  (Unable to assess ) Triggers for Past Attempts:  (Unable to assess ) Intentional Self Injurious Behavior:  (Unable to assess ) Risk to Others: Homicidal Ideation: No Thoughts of Harm to Others: No Current Homicidal Intent: No Current Homicidal Plan: No Access to Homicidal Means: No Identified Victim: N/A History of harm to others?:  (Unable  to assess ) Assessment of Violence: None  Noted Violent Behavior Description: No violent behaviors observed. PT is calm and cooperative at this time.  Does patient have access to weapons?: No Criminal Charges Pending?: No Does patient have a court date: No Prior Inpatient Therapy: Prior Inpatient Therapy:  (Unable to assess ) Prior Outpatient Therapy: Prior Outpatient Therapy:  (Unable to assess ) Does patient have an ACCT team?: Unknown Does patient have Intensive In-House Services?  : No Does patient have [facility] services? : Unknown Does patient have P4CC services?: Unknown  Past Medical History:  Past Medical History  Diagnosis Date  . GERD (gastroesophageal reflux disease)   . 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 Transfusion history 08-16-12    '99  . Pancreatitis 08-16-12    at present weakness,fatiques easily  . Foreign body 08-16-12    nose"BB" pellet since age 3  . History of ETOH abuse 08-16-12    Heavy alcohol use daily  . Anemia   . Anxiety   . Clotting disorder ([place])   . Substance abuse   . Cirrhosis [facility])     Past Surgical History  Procedure Laterality Date  . Neck surgery      fusion of neck-titanium hardware inplaced  . Esophagogastroduodenoscopy    . Colonoscopy    . Eus N/A 08/18/2012    Procedure: UPPER ENDOSCOPIC ULTRASOUND (EUS) LINEAR;  Surgeon: Beryle Beams, MD;  Location: [facility] ENDOSCOPY;  Service: Endoscopy;  Laterality: N/A;  . Neck surgery  3734,[id number]   Family History:  Family History  Problem Relation Age of Onset  . Hypertension Father   . Cancer Mother   . Stroke Mother    Family Psychiatric  History: Unknown  Social History:  History  Alcohol Use  . 50.4 oz/week  . 84 Cans of beer per week    Comment: 12 cans beer per day     History  Drug Use No    Social History   Social History  . Marital Status: Married    Spouse Name: N/A  . Number of Children: N/A  . Years of Education: N/A   Social History Main Topics  . Smoking status: Former Smoker    Types: Cigarettes     Quit date: 08/16/2005  . Smokeless tobacco: Never Used  . Alcohol Use: 50.4 oz/week    84 Cans of beer per week     Comment: 12 cans beer per day  . Drug Use: No  . Sexual Activity: Yes   Other Topics Concern  . None   Social History Narrative   Additional Social History:    History of alcohol / drug use?: Yes Name of Substance 1: Alcohol  1 - Age of First Use: Teen's  1 - Amount (size/oz): 12oz 1 - Frequency: daily  1 - Duration: ongoing  1 - Last Use / Amount: unknown                    Allergies:   Allergies  Allergen Reactions  . Aspirin Other (See Comments)    Has upset stomach in high doses only. Took 4 aspirin with EMS, no reaction.    Labs:  Results for orders placed or performed during the hospital encounter of 03/27/15 (from the past 48 hour(s))  CBC     Status: Abnormal   Collection Time: 03/30/15  6:15 AM  Result Value Ref Range   WBC 3.4 (L) 4.0 - 10.5 K/uL   RBC  3.30 (L) 4.22 - 5.81 MIL/uL   Hemoglobin 10.7 (L) 13.0 - 17.0 g/dL   HCT 30.9 (L) 39.0 - 52.0 %   MCV 93.6 78.0 - 100.0 fL   MCH 32.4 26.0 - 34.0 pg   MCHC 34.6 30.0 - 36.0 g/dL   RDW 15.3 11.5 - 15.5 %   Platelets 104 (L) 150 - 400 K/uL    Comment: REPEATED TO VERIFY PLATELET COUNT CONFIRMED BY SMEAR   Comprehensive metabolic panel     Status: Abnormal   Collection Time: 03/30/15  6:15 AM  Result Value Ref Range   Sodium 137 135 - 145 mmol/L   Potassium 3.7 3.5 - 5.1 mmol/L   Chloride 104 101 - 111 mmol/L   CO2 26 22 - 32 mmol/L   Glucose, Bld 101 (H) 65 - 99 mg/dL   BUN 12 6 - 20 mg/dL   Creatinine, Ser 0.95 0.61 - 1.24 mg/dL   Calcium 10.2 8.9 - 10.3 mg/dL   Total Protein 7.9 6.5 - 8.1 g/dL   Albumin 2.9 (L) 3.5 - 5.0 g/dL   AST 31 15 - 41 U/L   ALT 15 (L) 17 - 63 U/L   Alkaline Phosphatase 68 38 - 126 U/L   Total Bilirubin 0.8 0.3 - 1.2 mg/dL   GFR calc non Af Amer >60 >60 mL/min   GFR calc Af Amer >60 >60 mL/min    Comment: (NOTE) The eGFR has been calculated  using the CKD EPI equation. This calculation has not been validated in all clinical situations. eGFR's persistently <60 mL/min signify possible Chronic Kidney Disease.    Anion gap 7 5 - 15  Ammonia     Status: Abnormal   Collection Time: 03/31/15  6:30 AM  Result Value Ref Range   Ammonia 45 (H) 9 - 35 umol/L  Comprehensive metabolic panel     Status: Abnormal   Collection Time: 03/31/15  6:38 AM  Result Value Ref Range   Sodium 134 (L) 135 - 145 mmol/L   Potassium 4.1 3.5 - 5.1 mmol/L   Chloride 106 101 - 111 mmol/L   CO2 23 22 - 32 mmol/L   Glucose, Bld 124 (H) 65 - 99 mg/dL   BUN 16 6 - 20 mg/dL   Creatinine, Ser 1.17 0.61 - 1.24 mg/dL   Calcium 9.9 8.9 - 10.3 mg/dL   Total Protein 7.7 6.5 - 8.1 g/dL   Albumin 3.0 (L) 3.5 - 5.0 g/dL   AST 32 15 - 41 U/L   ALT 14 (L) 17 - 63 U/L   Alkaline Phosphatase 69 38 - 126 U/L   Total Bilirubin 1.1 0.3 - 1.2 mg/dL   GFR calc non Af Amer >60 >60 mL/min   GFR calc Af Amer >60 >60 mL/min    Comment: (NOTE) The eGFR has been calculated using the CKD EPI equation. This calculation has not been validated in all clinical situations. eGFR's persistently <60 mL/min signify possible Chronic Kidney Disease.    Anion gap 5 5 - 15    Current Facility-Administered Medications  Medication Dose Route Frequency Provider Last Rate Last Dose  . acetaminophen (TYLENOL) tablet 650 mg  650 mg Oral Q4H PRN Shawn C Joy, PA-C      . alum & mag hydroxide-simeth (MAALOX/MYLANTA) 200-200-20 MG/5ML suspension 30 mL  30 mL Oral PRN Shawn C Joy, PA-C      . lactulose (CHRONULAC) 10 GM/15ML solution 20 g  20 g Oral BID Lorayne Bender, PA-C  20 g at 03/31/15 0923  . LORazepam (ATIVAN) tablet 0-4 mg  0-4 mg Oral Q12H Shawn C Joy, PA-C   1 mg at 03/31/15 1833  . LORazepam (ATIVAN) tablet 1 mg  1 mg Oral Q8H PRN Shawn C Joy, PA-C   1 mg at 03/31/15 0108  . nicotine (NICODERM CQ - dosed in mg/24 hours) patch 21 mg  21 mg Transdermal Daily Shawn C Joy, PA-C   21 mg at  03/31/15 0923  . ondansetron (ZOFRAN) injection 4 mg  4 mg Intravenous Q6H PRN Jeryl Columbia, NP      . ondansetron (ZOFRAN) tablet 4 mg  4 mg Oral Q8H PRN Shawn C Joy, PA-C      . QUEtiapine (SEROQUEL) tablet 50 mg  50 mg Oral BID Mojeed Akintayo   50 mg at 03/31/15 0926  . spironolactone (ALDACTONE) tablet 100 mg  100 mg Oral Daily Shawn C Joy, PA-C   100 mg at 03/31/15 5825  . tamsulosin (FLOMAX) capsule 0.4 mg  0.4 mg Oral Daily Shawn C Joy, PA-C   0.4 mg at 03/31/15 0926  . thiamine 569m in normal saline (576m IVPB  500 mg Intravenous TID ShJonetta OsgoodMD   500 mg at 03/31/15 0932  . traZODone (DESYREL) tablet 50 mg  50 mg Oral QHS Shawn C Joy, PA-C   50 mg at 03/30/15 2200    Musculoskeletal: Strength & Muscle Tone: decreased Gait & Station: unable to stand Patient leans: N/A  Psychiatric Specialty Exam: ROS not completed secondary to altered mental status   Blood pressure 109/75, pulse 87, temperature 97.5 F (36.4 C), temperature source Oral, resp. rate 18, height '5\' 7"'  (1.702 m), weight 70.3 kg (154 lb 15.7 oz), SpO2 97 %.Body mass index is 24.27 kg/(m^2).  General Appearance: Bizarre, Disheveled and Guarded  Eye Contact::  Minimal  Speech:  Blocked, Slow and Slurred  Volume:  Decreased  Mood:  Anxious  Affect:  Non-Congruent and Inappropriate  Thought Process:  Disorganized, Irrelevant and Loose  Orientation:  Other:  Limited to person only  Thought Content:  Hallucinations: Visual and Rumination  Suicidal Thoughts:  No  Homicidal Thoughts:  No  Memory:  Immediate;   Poor Recent;   Poor  Judgement:  Poor  Insight:  Lacking  Psychomotor Activity:  Restlessness  Concentration:  Poor  Recall:  Poor  Fund of Knowledge:Poor  Language: Poor  Akathisia:  NA  Handed:  Right  AIMS (if indicated):     Assets:  Others:  Unable to assess at this time  ADL's:  Impaired  Cognition: Impaired,  Severe  Sleep:      Treatment Plan Summary: Daily contact with  patient to assess and evaluate symptoms and progress in treatment and Medication management  Reviewed neurology consultation and agreed with recommendations Continue Ativan detox treatment and CIWA protocol monitoring Continue Seroquel 50 mg twice daily for psychosis Continue trazodone 50 mg at bedtime for insomnia Appreciate psychiatric consultation and follow up as clinically required Please contact [phone] or [phone] if needs further assistance  Disposition: Patient does not meet criteria for psychiatric inpatient admission. Supportive therapy provided about ongoing stressors.  Benino Korinek,JANARDHAHA R. 03/31/2015 11:00 AM

## 2015-04-01 LAB — CBC
HCT: 28.9 % — ABNORMAL LOW (ref 39.0–52.0)
HEMOGLOBIN: 10 g/dL — AB (ref 13.0–17.0)
MCH: 31.8 pg (ref 26.0–34.0)
MCHC: 34.6 g/dL (ref 30.0–36.0)
MCV: 92 fL (ref 78.0–100.0)
Platelets: 93 10*3/uL — ABNORMAL LOW (ref 150–400)
RBC: 3.14 MIL/uL — AB (ref 4.22–5.81)
RDW: 15.7 % — ABNORMAL HIGH (ref 11.5–15.5)
WBC: 4.8 10*3/uL (ref 4.0–10.5)

## 2015-04-01 LAB — BASIC METABOLIC PANEL
Anion gap: 9 (ref 5–15)
BUN: 20 mg/dL (ref 6–20)
CALCIUM: 9.8 mg/dL (ref 8.9–10.3)
CO2: 21 mmol/L — ABNORMAL LOW (ref 22–32)
CREATININE: 1.15 mg/dL (ref 0.61–1.24)
Chloride: 104 mmol/L (ref 101–111)
GFR calc Af Amer: >60 mL/min (ref >60)
GLUCOSE: 96 mg/dL (ref 65–99)
POTASSIUM: 4 mmol/L (ref 3.5–5.1)
SODIUM: 134 mmol/L — AB (ref 135–145)

## 2015-04-01 MED ORDER — LACTULOSE 10 GM/15ML PO SOLN: 20.0000 g | Freq: Three times a day (TID) | ORAL | Status: DC

## 2015-04-01 MED ORDER — VITAMIN B-1 100 MG PO TABS
100.0000 mg | ORAL_TABLET | Freq: Every day | ORAL | Status: DC
  Administered 2015-04-01 – 2015-04-03 (×3): 100 mg via ORAL
  Filled 2015-04-01 (×3): qty 1

## 2015-04-01 MED ORDER — NICOTINE 21 MG/24HR TD PT24: 21.0000 mg | MEDICATED_PATCH | Freq: Every day | TRANSDERMAL | Status: DC

## 2015-04-01 MED ORDER — QUETIAPINE FUMARATE 50 MG PO TABS: 50.0000 mg | ORAL_TABLET | Freq: Two times a day (BID) | ORAL | Status: DC

## 2015-04-01 MED ORDER — DIAZEPAM 10 MG PO TABS: 5.0000 mg | ORAL_TABLET | Freq: Three times a day (TID) | ORAL | Status: DC | PRN

## 2015-04-01 NOTE — Progress Notes (Signed)
UR COMPLETED  

## 2015-04-01 NOTE — Discharge Summary (Addendum)
PATIENT DETAILS Name: [name] Age: 60 y.o. Sex: male Date of Birth: 1956-01-10 MRN: [id number]. Admitting Physician: Caren Griffins, MD [id number] [name], MD  Admit Date: 03/27/2015 Discharge date: 04/03/2015  Recommendations for Outpatient Follow-up:  1. Will likely need outpatient follow up with neurology/psychiatry  2. Repeat CBC/chemistry panel with LFTs in 1 week   PRIMARY DISCHARGE DIAGNOSIS:  Principal Problem:   Alcohol use disorder, severe, dependence  Active Problems:   Anemia   Alcohol abuse   Thrombocytopenia    Cirrhosis of liver (   Alcoholic cirrhosis of liver with ascites   Altered mental state   Acute encephalopathy   Suspected Wernicke's encephalopathy      PAST MEDICAL HISTORY: Past Medical History  Diagnosis Date  . GERD (gastroesophageal reflux disease)   . 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 Transfusion history 08-16-12    '99  . Pancreatitis 08-16-12    at present weakness,fatiques easily  . Foreign body 08-16-12    nose"BB" pellet since age 45  . History of ETOH abuse 08-16-12    Heavy alcohol use daily  . Anemia   . Anxiety   . Clotting disorder ([place])   . Substance abuse   . Cirrhosis ([place])     DISCHARGE MEDICATIONS: Current Discharge Medication List    START taking these medications   Details  nicotine (NICODERM CQ - DOSED IN MG/24 HOURS) 21 mg/24hr patch Place 1 patch (21 mg total) onto the skin daily. Qty: 28 patch, Refills: 0      CONTINUE these medications which have CHANGED   Details  diazepam (VALIUM) 10 MG tablet Take 0.5-1 tablets (5-10 mg total) by mouth 3 (three) times daily as needed for anxiety (SEDATION). Qty: 30 tablet, Refills: 0    lactulose (CHRONULAC) 10 GM/15ML solution Take 45 mLs (30 g total) by mouth 3 (three) times daily. Qty: 946 mL, Refills: 0    QUEtiapine (SEROQUEL) 50 MG tablet Take 1 tablet (50 mg total) by mouth 2 (two) times daily. Qty: 60 tablet, Refills: 0    spironolactone  (ALDACTONE) 100 MG tablet Take 1 tablet (100 mg total) by mouth daily. Qty: 30 tablet, Refills: 0    tamsulosin (FLOMAX) 0.4 MG CAPS capsule Take 1 capsule (0.4 mg total) by mouth daily. Qty: 30 capsule, Refills: 0    thiamine 100 MG tablet Take 1 tablet (100 mg total) by mouth daily. Qty: 30 tablet, Refills: 0    traZODone (DESYREL) 50 MG tablet Take 1 tablet (50 mg total) by mouth at bedtime. Qty: 30 tablet, Refills: 0      CONTINUE these medications which have NOT CHANGED   Details  folic acid (FOLVITE) A999333 MCG tablet Take 400 mcg by mouth daily.      STOP taking these medications     acetaminophen (TYLENOL) 325 MG tablet      naproxen sodium (ANAPROX) 220 MG tablet      nystatin (MYCOSTATIN) 100000 UNIT/ML suspension      ketoconazole (NIZORAL) 2 % cream      Multiple Vitamin (MULTIVITAMIN WITH MINERALS) TABS tablet         ALLERGIES:   Allergies  Allergen Reactions  . Aspirin Other (See Comments)    Has upset stomach in high doses only. Took 4 aspirin with EMS, no reaction.    BRIEF HPI:  See H&P, Labs, Consult and Test reports for all details in brief, patient is a 60 year old male with history of alcohol abuse/hemosiderosis- was  admitted for evaluation of altered mental status.   CONSULTATIONS:   psychiatry  PERTINENT RADIOLOGIC STUDIES: Dg Chest 2 View  03/27/2015  CLINICAL DATA:  Confusion, abnormal behavior and disorientation, medical clearance, history cirrhosis, smoking EXAM: CHEST  2 VIEW COMPARISON:  02/14/2015 FINDINGS: Minimal enlargement of cardiac silhouette. Mediastinal contours and pulmonary vascularity normal. Bibasilar atelectasis. Lungs otherwise clear. No infiltrate, pleural effusion or pneumothorax. Prior cervicothoracic fusion. No acute bony findings. IMPRESSION: Minimal enlargement of cardiac silhouette and bibasilar atelectasis. Electronically Signed   By: Lavonia Dana M.D.   On: 03/27/2015 16:23   Mr Brain Wo Contrast  03/20/2015   CLINICAL DATA:  Altered mental status. Increasing confusion and behavioral changes. History of alcohol abuse. EXAM: MRI HEAD WITHOUT CONTRAST TECHNIQUE: Multiplanar, multiecho pulse sequences of the brain and surrounding structures were obtained without intravenous contrast. COMPARISON:  Head CT 02/16/2015 FINDINGS: The examination had to be discontinued prior to completion due to patient's altered mental status and inability to follow instructions. Axial diffusion, FLAIR, T2, and T2* gradient echo images and coronal T2 weighted images were obtained. Some sequences are mildly to moderately motion degraded, particularly the gradient echo sequence. There is extensive magnetic susceptibility artifact from a superficial metallic foreign body/BB inferior to the patient's nose. This obscures the anterior frontal lobes and portion of the posterior fossa including the brainstem on diffusion-weighted and gradient echo images. There is no evidence of acute infarct involving the portions of the brain that can be evaluated on the diffusion sequence. No gross intracranial hemorrhage is identified, with artifactual FLAIR sulcal hyperintensity in the anterior frontal lobes. There is mild generalized cerebral atrophy. Several punctate foci of T2 hyperintensity in the cerebral white matter are nonspecific and not greater than expected for patient's age. No gross abnormality is identified in the orbits. The visualized paranasal paranasal sinuses are clear. Left mastoid is under pneumatized. Major intracranial vascular flow voids are preserved. IMPRESSION: 1. Incomplete examination with motion and magnetic susceptibility artifact as above. 2. No acute intracranial abnormality identified. 3. Mildly advanced cerebral atrophy for age. Electronically Signed   By: Logan Bores M.D.   On: 03/20/2015 16:26     PERTINENT LAB RESULTS: CBC:  Recent Labs  04/01/15 0715  WBC 4.8  HGB 10.0*  HCT 28.9*  PLT 93*   CMET CMP       Component Value Date/Time   NA 134* 04/01/2015 0715   K 4.0 04/01/2015 0715   CL 104 04/01/2015 0715   CO2 21* 04/01/2015 0715   GLUCOSE 96 04/01/2015 0715   BUN 20 04/01/2015 0715   CREATININE 1.15 04/01/2015 0715   CALCIUM 9.8 04/01/2015 0715   PROT 7.7 03/31/2015 0638   ALBUMIN 3.0* 03/31/2015 0638   AST 32 03/31/2015 0638   ALT 14* 03/31/2015 0638   ALKPHOS 69 03/31/2015 0638   BILITOT 1.1 03/31/2015 0638   GFRNONAA >60 04/01/2015 0715   GFRAA >60 04/01/2015 0715    GFR Estimated Creatinine Clearance: 63.9 mL/min (by C-G formula based on Cr of 1.15). No results for input(s): LIPASE, AMYLASE in the last 72 hours. No results for input(s): CKTOTAL, CKMB, CKMBINDEX, TROPONINI in the last 72 hours. Invalid input(s): POCBNP No results for input(s): DDIMER in the last 72 hours. No results for input(s): HGBA1C in the last 72 hours. No results for input(s): CHOL, HDL, LDLCALC, TRIG, CHOLHDL, LDLDIRECT in the last 72 hours. No results for input(s): TSH, T4TOTAL, T3FREE, THYROIDAB in the last 72 hours.  Invalid input(s): FREET3 No results  for input(s): VITAMINB12, FOLATE, FERRITIN, TIBC, IRON, RETICCTPCT in the last 72 hours. Coags: No results for input(s): INR in the last 72 hours.  Invalid input(s): PT Microbiology: No results found for this or any previous visit (from the past 240 hour(s)).   BRIEF HOSPITAL COURSE:  Suspected Wernicke's encephalopathy: Patient presented with altered mental status, reported diplopia, and gait disturbance. Suspected to have Wernicke's encephalopathy. Treated with high-dose thiamine 500 mg IV 3 times a day 3 days. Minimal improvement, continues to be somewhat confused. MRI brain 1/5 negative for major abnormalities, UA/chest x-ray negative for infection. Ammonia mildly elevated. EEG negative for seizure activity. Per spouse-he has not had a drink in the past 2 months-unfortunately minimal improvement in spite of receiving multiple doses of IV  thiamine. Both neurology and psychiatry were consulted during this hospital stay and help to manage this patient. Spoke with Neurology today-Dr. Raoul Pitch the phone-no further recommendations while inpatient-okay to discharge. Please ensure outpatient follow-up with both psychiatry and neurology on discharge. Initially plans were to discharge to SNF-unfortunately patient has a 20% co-pay-which the family cannot afford, being discharged home with maximal home health services.  Active Problems: Liver cirrhosis: Likely alcoholic, no evidence of decompensation at this time. Continue Aldactone, lactulose-but change to TID.  Thrombocytopenia: Chronic issue-likely secondary to cirrhosis/hypersplenism.   Anemia: No evidence of blood loss-likely anemia of chronic disease.  BPH: Continue Flomax  Tobacco abuse: Continue nicotine patch-will need counseling when more awake and alert  TODAY-DAY OF DISCHARGE:  Subjective:   Conlan Kettering today remains pleasantly confused  Objective:   Blood pressure 104/66, pulse 77, temperature 97.2 F (36.2 C), temperature source Axillary, resp. rate 15, height 5\' 7"  (1.702 m), weight 70.3 kg (154 lb 15.7 oz), SpO2 98 %.  Intake/Output Summary (Last 24 hours) at 04/03/15 1140 Last data filed at 04/03/15 0700  Gross per 24 hour  Intake      0 ml  Output    400 ml  Net   -400 ml   Filed Weights   03/28/15 2048  Weight: 70.3 kg (154 lb 15.7 oz)    Exam Awake Alert, No new F.N deficits, Normal affect [place].AT,PERRAL Supple Neck,No JVD, No cervical lymphadenopathy appriciated.  Symmetrical Chest wall movement, Good air movement bilaterally, CTAB RRR,No Gallops,Rubs or new Murmurs, No Parasternal Heave +ve B.Sounds, Abd Soft, Non tender, No organomegaly appriciated, No rebound -guarding or rigidity. No Cyanosis, Clubbing or edema, No new Rash or bruise  DISCHARGE CONDITION: Stable  DISPOSITION: SNF  DISCHARGE INSTRUCTIONS:    Activity:  As  tolerated with Full fall precautions use walker/cane & assistance as needed  Get Medicines reviewed and adjusted: Please take all your medications with you for your next visit with your Primary MD  Please request your Primary MD to go over all hospital tests and procedure/radiological results at the follow up, please ask your Primary MD to get all Hospital records sent to his/her office.  If you experience worsening of your admission symptoms, develop shortness of breath, life threatening emergency, suicidal or homicidal thoughts you must seek medical attention immediately by calling 911 or calling your MD immediately  if symptoms less severe.  You must read complete instructions/literature along with all the possible adverse reactions/side effects for all the Medicines you take and that have been prescribed to you. Take any new Medicines after you have completely understood and accpet all the possible adverse reactions/side effects.   Do not drive when taking Pain medications.   Do not take  more than prescribed Pain, Sleep and Anxiety Medications  Special Instructions: If you have smoked or chewed Tobacco  in the last 2 yrs please stop smoking, stop any regular Alcohol  and or any Recreational drug use.  Wear Seat belts while driving.  Please note  You were cared for by a hospitalist during your hospital stay. Once you are discharged, your primary care physician will handle any further medical issues. Please note that NO REFILLS for any discharge medications will be authorized once you are discharged, as it is imperative that you return to your primary care physician (or establish a relationship with a primary care physician if you do not have one) for your aftercare needs so that they can reassess your need for medications and monitor your lab values.   Diet recommendation: Heart Healthy diet  Discharge Instructions    Ambulatory referral to Neurology    Complete by:  As directed   An  appointment is requested in approximately: 2 weeks     Diet - low sodium heart healthy    Complete by:  As directed      Increase activity slowly    Complete by:  As directed            Follow-up Information    Follow up with Woody Seller, MD. Schedule an appointment as soon as possible for a visit in 1 week.   Specialty:  Family Medicine   Why:  Hospital follow up   Contact information:   [address] [place] [address] [phone]       Follow up with [facility]. Schedule an appointment as soon as possible for a visit in 2 weeks.   Why:  Hospital follow up   Contact information:   [address]     [address] [place] [phone]      Total Time spent on discharge equals 45 minutes.  SignedOren Binet 04/03/2015 11:40 AM

## 2015-04-01 NOTE — Consult Note (Signed)
[facility] Face-to-Face Psychiatry Consult follow-up  Reason for Consult:  AMS and alcohol withdrawal delirium Referring Physician:  Dr. Sloan Leiter Patient Identification: [name] MRN:  [id number] Principal Diagnosis: Alcohol use disorder, severe, dependence ([place]) Diagnosis:   Patient Active Problem List   Diagnosis Date Noted  . Altered mental state [R41.82] 03/28/2015  . Acute encephalopathy [G93.40] 03/28/2015  . Alcohol use disorder, severe, dependence ([place]) [F10.20] 02/16/2015  . Alcohol use with intoxication delirium ([place]) [F10.921] 02/16/2015  . 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 Encephalopathy [G93.40] 02/12/2015  . Hyponatremia [E87.1] 02/12/2015  . AKI (acute kidney injury) ([place]) [N17.9] 02/12/2015  . Alcoholic cirrhosis of liver with ascites ([place]) [K70.31]   . Acute blood loss anemia [D62]   . Upper GI bleed [K92.2]   . Thrombocytopenia ([place]) [D69.6] 10/10/2014  . GI bleed [K92.2] 10/10/2014  . Cirrhosis of liver ([place]) [K74.60] 10/10/2014  . Pulmonary edema [J81.1] 10/10/2014  . Pancytopenia ([place]) [S50.539] 10/10/2014  . Cellulitis [L03.90] 07/29/2014  . Scrotal swelling [N50.89] 07/29/2014  . Anemia [D64.9] 04/12/2008  . Alcohol abuse [F10.10] 04/12/2008  . GERD [K21.9] 04/12/2008  . PANCREATITIS [K85.90] 04/12/2008  . Blood in stool [K92.1] 04/12/2008  . Other malaise and fatigue [R53.81, R53.83] 04/12/2008  . HEMOPTYSIS [786.3] 04/12/2008  . NAUSEA AND VOMITING [R11.2] 04/12/2008  . EPIGASTRIC PAIN [R10.13] 04/12/2008  . ALCOHOLIC HEPATITIS, HX OF [Z87.19] 04/12/2008    Total Time spent with patient: 20 minutes  Subjective:   [name] is a 60 y.o. male patient admitted with altered mental status.  HPI:  [name] is a 60 y.o. male has a past medical history significant for alcohol abuse, liver cirrhosis, anemia, pancytopenia, polysubstance abuse, admitted to [facility] with altered mental status.  Psychiatric face-to-face consultation evaluation requested for increased confusion, altered mental status secondary to chronic alcoholism. Patient was initially evaluated at the behavioral health unit in the emergency room at [facility]. Patient is a poor historian and has no family members at bedside and he appeared confused, slurred speech, knows his name at this time. Patient currently psychiatric evaluation is limited by altered mental status.   Please review the following information for additional information: Basically patient was admitted with acute metabolic encephalopathy in the setting of active alcohol abuse in December 2016, improved and discharged home at that time. Since he has been home, he continued to be confused and never really improved, with intermittent hallucinations, odd behavior, poor appetite as well as altered sleeping habits. Apparently he hasn't been drinking alcohol since per his wife, however patient does state that he is still drinking. Patient was admitted here on 1/12, he has been encephalopathic. Neurology was consulted and evaluated patient in the ED, and they're concerned about work is encephalopathy, recommended high-dose thiamine and admission over to [facility].   Past Psychiatric History: none reported  Interval history: Patient has been with altered mental status secondary to alcohol-induced delirium. Patient has been followed by neurology and provide supportive care. Patient continued to be poor historian and unable to participate during this follow-up evaluation. Patient has been able to tolerate his medication and reportedly has no adverse effects. Psychiatric consultation will follow up with the patient for further consultation needs and treatment needs. Spoke with the hospitalist who believes that patient may needed skilled nursing facility for long-term care as patient is not showing much improvement in his mental status.  Risk to Self:  Suicidal Ideation: No Suicidal Intent: No Is patient  at risk for suicide?: No Suicidal Plan?: No Access to Means: No What has been your use of drugs/alcohol within the last 12 months?: Alcohol  How many times?:  (Unable to assess) Other Self Harm Risks:  (Unable to assess ) Triggers for Past Attempts:  (Unable to assess ) Intentional Self Injurious Behavior:  (Unable to assess ) Risk to Others: Homicidal Ideation: No Thoughts of Harm to Others: No Current Homicidal Intent: No Current Homicidal Plan: No Access to Homicidal Means: No Identified Victim: N/A History of harm to others?:  (Unable to assess ) Assessment of Violence: None Noted Violent Behavior Description: No violent behaviors observed. PT is calm and cooperative at this time.  Does patient have access to weapons?: No Criminal Charges Pending?: No Does patient have a court date: No Prior Inpatient Therapy: Prior Inpatient Therapy:  (Unable to assess ) Prior Outpatient Therapy: Prior Outpatient Therapy:  (Unable to assess ) Does patient have an ACCT team?: Unknown Does patient have Intensive In-House Services?  : No Does patient have [facility] services? : Unknown Does patient have P4[facility] services?: Unknown  Past Medical History:  Past Medical History  Diagnosis Date  . GERD (gastroesophageal reflux disease)   . 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 Transfusion history 08-16-12    '99  . Pancreatitis 08-16-12    at present weakness,fatiques easily  . Foreign body 08-16-12    nose"BB" pellet since age 62  . History of ETOH abuse 08-16-12    Heavy alcohol use daily  . Anemia   . Anxiety   . Clotting disorder ([place])   . Substance abuse   . Cirrhosis [facility])     Past Surgical History  Procedure Laterality Date  . Neck surgery      fusion of neck-titanium hardware inplaced  . Esophagogastroduodenoscopy    . Colonoscopy    . Eus N/A 08/18/2012    Procedure: UPPER ENDOSCOPIC ULTRASOUND (EUS) LINEAR;  Surgeon: Beryle Beams, MD;  Location: [facility] ENDOSCOPY;  Service: Endoscopy;  Laterality: N/A;  . Neck surgery  [id number]   Family History:  Family History  Problem Relation Age of Onset  . Hypertension Father   . Cancer Mother   . Stroke Mother    Family Psychiatric  History: Unknown  Social History:  History  Alcohol Use  . 50.4 oz/week  . 84 Cans of beer per week    Comment: 12 cans beer per day     History  Drug Use No    Social History   Social History  . Marital Status: Married    Spouse Name: N/A  . Number of Children: N/A  . Years of Education: N/A   Social History Main Topics  . Smoking status: Former Smoker    Types: Cigarettes    Quit date: 08/16/2005  . Smokeless tobacco: Never Used  . Alcohol Use: 50.4 oz/week    84 Cans of beer per week     Comment: 12 cans beer per day  . Drug Use: No  . Sexual Activity: Yes   Other Topics Concern  . None   Social History Narrative   Additional Social History:    History of alcohol / drug use?: Yes Name of Substance 1: Alcohol  1 - Age of First Use: Teen's  1 - Amount (size/oz): 12oz 1 - Frequency: daily  1 - Duration: ongoing  1 - Last Use / Amount: unknown  Allergies:   Allergies  Allergen Reactions  . Aspirin Other (See Comments)    Has upset stomach in high doses only. Took 4 aspirin with EMS, no reaction.    Labs:  Results for orders placed or performed during the hospital encounter of 03/27/15 (from the past 48 hour(s))  Ammonia     Status: Abnormal   Collection Time: 03/31/15  6:30 AM  Result Value Ref Range   Ammonia 45 (H) 9 - 35 umol/L  Comprehensive metabolic panel     Status: Abnormal   Collection Time: 03/31/15  6:38 AM  Result Value Ref Range   Sodium 134 (L) 135 - 145 mmol/L   Potassium 4.1 3.5 - 5.1 mmol/L   Chloride 106 101 - 111 mmol/L   CO2 23 22 - 32 mmol/L   Glucose, Bld 124 (H) 65 - 99 mg/dL   BUN 16 6 - 20 mg/dL   Creatinine, Ser 1.17 0.61 - 1.24 mg/dL   Calcium  9.9 8.9 - 10.3 mg/dL   Total Protein 7.7 6.5 - 8.1 g/dL   Albumin 3.0 (L) 3.5 - 5.0 g/dL   AST 32 15 - 41 U/L   ALT 14 (L) 17 - 63 U/L   Alkaline Phosphatase 69 38 - 126 U/L   Total Bilirubin 1.1 0.3 - 1.2 mg/dL   GFR calc non Af Amer >60 >60 mL/min   GFR calc Af Amer >60 >60 mL/min    Comment: (NOTE) The eGFR has been calculated using the CKD EPI equation. This calculation has not been validated in all clinical situations. eGFR's persistently <60 mL/min signify possible Chronic Kidney Disease.    Anion gap 5 5 - 15  CBC     Status: Abnormal   Collection Time: 04/01/15  7:15 AM  Result Value Ref Range   WBC 4.8 4.0 - 10.5 K/uL   RBC 3.14 (L) 4.22 - 5.81 MIL/uL   Hemoglobin 10.0 (L) 13.0 - 17.0 g/dL   HCT 28.9 (L) 39.0 - 52.0 %   MCV 92.0 78.0 - 100.0 fL   MCH 31.8 26.0 - 34.0 pg   MCHC 34.6 30.0 - 36.0 g/dL   RDW 15.7 (H) 11.5 - 15.5 %   Platelets 93 (L) 150 - 400 K/uL    Comment: CONSISTENT WITH PREVIOUS RESULT  Basic metabolic panel     Status: Abnormal   Collection Time: 04/01/15  7:15 AM  Result Value Ref Range   Sodium 134 (L) 135 - 145 mmol/L   Potassium 4.0 3.5 - 5.1 mmol/L   Chloride 104 101 - 111 mmol/L   CO2 21 (L) 22 - 32 mmol/L   Glucose, Bld 96 65 - 99 mg/dL   BUN 20 6 - 20 mg/dL   Creatinine, Ser 1.15 0.61 - 1.24 mg/dL   Calcium 9.8 8.9 - 10.3 mg/dL   GFR calc non Af Amer >60 >60 mL/min   GFR calc Af Amer >60 >60 mL/min    Comment: (NOTE) The eGFR has been calculated using the CKD EPI equation. This calculation has not been validated in all clinical situations. eGFR's persistently <60 mL/min signify possible Chronic Kidney Disease.    Anion gap 9 5 - 15    Current Facility-Administered Medications  Medication Dose Route Frequency Provider Last Rate Last Dose  . acetaminophen (TYLENOL) tablet 650 mg  650 mg Oral Q4H PRN Shawn C Joy, PA-C      . alum & mag hydroxide-simeth (MAALOX/MYLANTA) 200-200-20 MG/5ML suspension 30 mL  30 mL  Oral PRN Shawn C  Joy, PA-C      . lactulose (CHRONULAC) 10 GM/15ML solution 20 g  20 g Oral TID Jonetta Osgood, MD   20 g at 04/01/15 0952  . LORazepam (ATIVAN) injection 1 mg  1 mg Intravenous Q6H PRN Jonetta Osgood, MD   1 mg at 04/01/15 0006  . nicotine (NICODERM CQ - dosed in mg/24 hours) patch 21 mg  21 mg Transdermal Daily Shawn C Joy, PA-C   21 mg at 04/01/15 0954  . ondansetron (ZOFRAN) injection 4 mg  4 mg Intravenous Q6H PRN Jeryl Columbia, NP      . ondansetron (ZOFRAN) tablet 4 mg  4 mg Oral Q8H PRN Shawn C Joy, PA-C      . QUEtiapine (SEROQUEL) tablet 50 mg  50 mg Oral BID Mojeed Akintayo   50 mg at 04/01/15 0953  . spironolactone (ALDACTONE) tablet 100 mg  100 mg Oral Daily Shawn C Joy, PA-C   100 mg at 04/01/15 0953  . tamsulosin (FLOMAX) capsule 0.4 mg  0.4 mg Oral Daily Shawn C Joy, PA-C   0.4 mg at 04/01/15 0953  . traZODone (DESYREL) tablet 50 mg  50 mg Oral QHS Shawn C Joy, PA-C   50 mg at 03/31/15 2110    Musculoskeletal: Strength & Muscle Tone: decreased Gait & Station: unable to stand Patient leans: N/A  Psychiatric Specialty Exam: ROS not completed secondary to altered mental status   Blood pressure 99/65, pulse 84, temperature 97.4 F (36.3 C), temperature source Oral, resp. rate 18, height '5\' 7"'  (1.702 m), weight 70.3 kg (154 lb 15.7 oz), SpO2 98 %.Body mass index is 24.27 kg/(m^2).  General Appearance: Bizarre, Disheveled and Guarded  Eye Contact::  Minimal  Speech:  Blocked, Slow and Slurred  Volume:  Decreased  Mood:  Anxious  Affect:  Non-Congruent and Inappropriate  Thought Process:  Disorganized, Irrelevant and Loose  Orientation:  Other:  Limited to person only  Thought Content:  Hallucinations: Visual and Rumination  Suicidal Thoughts:  No  Homicidal Thoughts:  No  Memory:  Immediate;   Poor Recent;   Poor  Judgement:  Poor  Insight:  Lacking  Psychomotor Activity:  Restlessness  Concentration:  Poor  Recall:  Poor  Fund of Knowledge:Poor  Language:  Poor  Akathisia:  NA  Handed:  Right  AIMS (if indicated):     Assets:  Others:  Unable to assess at this time  ADL's:  Impaired  Cognition: Impaired,  Severe  Sleep:      Treatment Plan Summary: Daily contact with patient to assess and evaluate symptoms and progress in treatment and Medication management  Reviewed neurology consultation and agreed with recommendations Continue Ativan detox treatment and CIWA protocol monitoring Continue Seroquel 50 mg twice daily for psychosis Continue trazodone 50 mg at bedtime for insomnia Appreciate psychiatric consultation and follow up as clinically required Please contact [phone] or [phone] if needs further assistance  Disposition: Patient does not meet criteria for psychiatric inpatient admission. Supportive therapy provided about ongoing stressors.  Ravyn Nikkel,JANARDHAHA R. 04/01/2015 11:08 AM

## 2015-04-02 DIAGNOSIS — R410 Disorientation, unspecified: Secondary | ICD-10-CM

## 2015-04-02 DIAGNOSIS — F1020 Alcohol dependence, uncomplicated: Secondary | ICD-10-CM

## 2015-04-02 NOTE — Progress Notes (Signed)
After speaking with a SNF facility, the patient would need to pay 20% of the cost of a SNF at the facility upfront. The patient's wife reported that she is unable to afford this cost. Patient will discharge tomorrow with Home Health. The patient's wife stated she would transport patient by car.  CSW provided patient with information on how to apply for Medicaid.  CSW signing off.  Percell Locus Jolayne Branson LCSWA [phone]

## 2015-04-02 NOTE — Consult Note (Signed)
[facility] Face-to-Face Psychiatry Consult follow-up  Reason for Consult:  AMS and alcohol withdrawal delirium Referring Physician:  Dr. Sloan Leiter Patient Identification: [name] MRN:  [id number] Principal Diagnosis: Alcohol use disorder, severe, dependence ([place]) Diagnosis:   Patient Active Problem List   Diagnosis Date Noted  . Altered mental state [R41.82] 03/28/2015  . Acute encephalopathy [G93.40] 03/28/2015  . Alcohol use disorder, severe, dependence ([place]) [F10.20] 02/16/2015  . Alcohol use with intoxication delirium ([place]) [F10.921] 02/16/2015  . 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 Encephalopathy [G93.40] 02/12/2015  . Hyponatremia [E87.1] 02/12/2015  . AKI (acute kidney injury) ([place]) [N17.9] 02/12/2015  . Alcoholic cirrhosis of liver with ascites ([place]) [K70.31]   . Acute blood loss anemia [D62]   . Upper GI bleed [K92.2]   . Thrombocytopenia ([place]) [D69.6] 10/10/2014  . GI bleed [K92.2] 10/10/2014  . Cirrhosis of liver ([place]) [K74.60] 10/10/2014  . Pulmonary edema [J81.1] 10/10/2014  . Pancytopenia ([place]) [N86.767] 10/10/2014  . Cellulitis [L03.90] 07/29/2014  . Scrotal swelling [N50.89] 07/29/2014  . Anemia [D64.9] 04/12/2008  . Alcohol abuse [F10.10] 04/12/2008  . GERD [K21.9] 04/12/2008  . PANCREATITIS [K85.90] 04/12/2008  . Blood in stool [K92.1] 04/12/2008  . Other malaise and fatigue [R53.81, R53.83] 04/12/2008  . HEMOPTYSIS [786.3] 04/12/2008  . NAUSEA AND VOMITING [R11.2] 04/12/2008  . EPIGASTRIC PAIN [R10.13] 04/12/2008  . ALCOHOLIC HEPATITIS, HX OF [Z87.19] 04/12/2008    Total Time spent with patient: 20 minutes  Subjective:   [name] is a 60 y.o. male patient admitted with altered mental status.  HPI:  [name] is a 60 y.o. male has a past medical history significant for alcohol abuse, liver cirrhosis, anemia, pancytopenia, polysubstance abuse, admitted to [facility] with altered mental status.  Psychiatric face-to-face consultation evaluation requested for increased confusion, altered mental status secondary to chronic alcoholism. Patient was initially evaluated at the behavioral health unit in the emergency room at [facility]. Patient is a poor historian and has no family members at bedside and he appeared confused, slurred speech, knows his name at this time. Patient currently psychiatric evaluation is limited by altered mental status.   Please review the following information for additional information: Basically patient was admitted with acute metabolic encephalopathy in the setting of active alcohol abuse in December 2016, improved and discharged home at that time. Since he has been home, he continued to be confused and never really improved, with intermittent hallucinations, odd behavior, poor appetite as well as altered sleeping habits. Apparently he hasn't been drinking alcohol since per his wife, however patient does state that he is still drinking. Patient was admitted here on 1/12, he has been encephalopathic. Neurology was consulted and evaluated patient in the ED, and they're concerned about work is encephalopathy, recommended high-dose thiamine and admission over to [facility]. Past Psychiatric History: none reported  Interval history: Patient appeared sleeping on his back and covered with the bed sheets. Patient responded briefly but does not engage in communication. Patient does not appear to be in distress, has no irritability, agitation and aggressive behavior but continued to be confused. Patient is known to be alcoholic dependent and admitted with altered mental status secondary to alcohol-induced delirium. Patient has been followed by neurology and provide supportive care.   Patient continued to be poor historian and unable to participate during this follow-up evaluation. Patient has been able to tolerate his medication and reportedly has no adverse effects.  Psychiatric consultation will sign off at this  time as no further intervention is required. Spoke with the hospitalist who believes that patient may needed skilled nursing facility for long-term care as patient has limited improvement in functioning and mental status.  Risk to Self: Suicidal Ideation: No Suicidal Intent: No Is patient at risk for suicide?: No Suicidal Plan?: No Access to Means: No What has been your use of drugs/alcohol within the last 12 months?: Alcohol  How many times?:  (Unable to assess) Other Self Harm Risks:  (Unable to assess ) Triggers for Past Attempts:  (Unable to assess ) Intentional Self Injurious Behavior:  (Unable to assess ) Risk to Others: Homicidal Ideation: No Thoughts of Harm to Others: No Current Homicidal Intent: No Current Homicidal Plan: No Access to Homicidal Means: No Identified Victim: N/A History of harm to others?:  (Unable to assess ) Assessment of Violence: None Noted Violent Behavior Description: No violent behaviors observed. PT is calm and cooperative at this time.  Does patient have access to weapons?: No Criminal Charges Pending?: No Does patient have a court date: No Prior Inpatient Therapy: Prior Inpatient Therapy:  (Unable to assess ) Prior Outpatient Therapy: Prior Outpatient Therapy:  (Unable to assess ) Does patient have an ACCT team?: Unknown Does patient have Intensive In-House Services?  : No Does patient have [facility] services? : Unknown Does patient have P4CC services?: Unknown  Past Medical History:  Past Medical History  Diagnosis Date  . GERD (gastroesophageal reflux disease)   . 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 Transfusion history 08-16-12    '99  . Pancreatitis 08-16-12    at present weakness,fatiques easily  . 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 History of ETOH abuse 08-16-12    Heavy alcohol use daily  . Anemia   . Anxiety   . Clotting disorder ([place])   . Substance abuse   .  Cirrhosis [facility])     Past Surgical History  Procedure Laterality Date  . Neck surgery      fusion of neck-titanium hardware inplaced  . Esophagogastroduodenoscopy    . Colonoscopy    . Eus N/A 08/18/2012    Procedure: UPPER ENDOSCOPIC ULTRASOUND (EUS) LINEAR;  Surgeon: Beryle Beams, MD;  Location: [facility] ENDOSCOPY;  Service: Endoscopy;  Laterality: N/A;  . Neck surgery  [id number]   Family History:  Family History  Problem Relation Age of Onset  . Hypertension Father   . Cancer Mother   . Stroke Mother    Family Psychiatric  History: Unknown  Social History:  History  Alcohol Use  . 50.4 oz/week  . 84 Cans of beer per week    Comment: 12 cans beer per day     History  Drug Use No    Social History   Social History  . Marital Status: Married    Spouse Name: N/A  . Number of Children: N/A  . Years of Education: N/A   Social History Main Topics  . Smoking status: Former Smoker    Types: Cigarettes    Quit date: 08/16/2005  . Smokeless tobacco: Never Used  . Alcohol Use: 50.4 oz/week    84 Cans of beer per week     Comment: 12 cans beer per day  . Drug Use: No  . Sexual Activity: Yes   Other Topics Concern  . None   Social History Narrative   Additional Social History:    History of alcohol / drug use?: Yes Name  of Substance 1: Alcohol  1 - Age of First Use: Teen's  1 - Amount (size/oz): 12oz 1 - Frequency: daily  1 - Duration: ongoing  1 - Last Use / Amount: unknown                    Allergies:   Allergies  Allergen Reactions  . Aspirin Other (See Comments)    Has upset stomach in high doses only. Took 4 aspirin with EMS, no reaction.    Labs:  Results for orders placed or performed during the hospital encounter of 03/27/15 (from the past 48 hour(s))  CBC     Status: Abnormal   Collection Time: 04/01/15  7:15 AM  Result Value Ref Range   WBC 4.8 4.0 - 10.5 K/uL   RBC 3.14 (L) 4.22 - 5.81 MIL/uL   Hemoglobin 10.0 (L) 13.0 - 17.0 g/dL   HCT  28.9 (L) 39.0 - 52.0 %   MCV 92.0 78.0 - 100.0 fL   MCH 31.8 26.0 - 34.0 pg   MCHC 34.6 30.0 - 36.0 g/dL   RDW 15.7 (H) 11.5 - 15.5 %   Platelets 93 (L) 150 - 400 K/uL    Comment: CONSISTENT WITH PREVIOUS RESULT  Basic metabolic panel     Status: Abnormal   Collection Time: 04/01/15  7:15 AM  Result Value Ref Range   Sodium 134 (L) 135 - 145 mmol/L   Potassium 4.0 3.5 - 5.1 mmol/L   Chloride 104 101 - 111 mmol/L   CO2 21 (L) 22 - 32 mmol/L   Glucose, Bld 96 65 - 99 mg/dL   BUN 20 6 - 20 mg/dL   Creatinine, Ser 1.15 0.61 - 1.24 mg/dL   Calcium 9.8 8.9 - 10.3 mg/dL   GFR calc non Af Amer >60 >60 mL/min   GFR calc Af Amer >60 >60 mL/min    Comment: (NOTE) The eGFR has been calculated using the CKD EPI equation. This calculation has not been validated in all clinical situations. eGFR's persistently <60 mL/min signify possible Chronic Kidney Disease.    Anion gap 9 5 - 15    Current Facility-Administered Medications  Medication Dose Route Frequency Provider Last Rate Last Dose  . acetaminophen (TYLENOL) tablet 650 mg  650 mg Oral Q4H PRN Shawn C Joy, PA-C      . alum & mag hydroxide-simeth (MAALOX/MYLANTA) 200-200-20 MG/5ML suspension 30 mL  30 mL Oral PRN Shawn C Joy, PA-C      . lactulose (CHRONULAC) 10 GM/15ML solution 20 g  20 g Oral TID Jonetta Osgood, MD   20 g at 04/01/15 2211  . LORazepam (ATIVAN) injection 1 mg  1 mg Intravenous Q6H PRN Jonetta Osgood, MD   1 mg at 04/01/15 0006  . nicotine (NICODERM CQ - dosed in mg/24 hours) patch 21 mg  21 mg Transdermal Daily Shawn C Joy, PA-C   21 mg at 04/01/15 0954  . ondansetron (ZOFRAN) injection 4 mg  4 mg Intravenous Q6H PRN Jeryl Columbia, NP      . ondansetron (ZOFRAN) tablet 4 mg  4 mg Oral Q8H PRN Shawn C Joy, PA-C      . QUEtiapine (SEROQUEL) tablet 50 mg  50 mg Oral BID Mojeed Akintayo   50 mg at 04/01/15 2209  . spironolactone (ALDACTONE) tablet 100 mg  100 mg Oral Daily Shawn C Joy, PA-C   100 mg at 04/01/15  0953  . tamsulosin (FLOMAX) capsule 0.4 mg  0.4 mg Oral Daily Lorayne Bender, PA-C   0.4 mg at 04/01/15 0953  . thiamine (VITAMIN B-1) tablet 100 mg  100 mg Oral Daily Jonetta Osgood, MD   100 mg at 04/01/15 1831  . traZODone (DESYREL) tablet 50 mg  50 mg Oral QHS Shawn C Joy, PA-C   50 mg at 04/01/15 2209    Musculoskeletal: Strength & Muscle Tone: decreased Gait & Station: unable to stand Patient leans: N/A  Psychiatric Specialty Exam: ROS   Blood pressure 126/97, pulse 100, temperature 98.2 F (36.8 C), temperature source Oral, resp. rate 20, height _0  (1.702 m), weight 70.3 kg (154 lb 15.7 oz), SpO2 94 %.Body mass index is 24.27 kg/(m^2).  General Appearance: Bizarre, Disheveled and Guarded  Eye Contact::  Minimal  Speech:  Blocked, Slow and Slurred  Volume:  Decreased  Mood:  Anxious  Affect:  Non-Congruent and Inappropriate  Thought Process:  Disorganized, Irrelevant and Loose  Orientation:  Other:  Limited to person only  Thought Content:  Hallucinations: Visual and Rumination  Suicidal Thoughts:  No  Homicidal Thoughts:  No  Memory:  Immediate;   Poor Recent;   Poor  Judgement:  Poor  Insight:  Lacking  Psychomotor Activity:  Restlessness  Concentration:  Poor  Recall:  Poor  Fund of Knowledge:Poor  Language: Poor  Akathisia:  NA  Handed:  Right  AIMS (if indicated):     Assets:  Others:  Unable to assess at this time  ADL's:  Impaired  Cognition: Impaired,  Severe  Sleep:      Treatment Plan Summary: Daily contact with patient to assess and evaluate symptoms and progress in treatment and Medication management  Reviewed neurology consultation and agreed with recommendations Continue Ativan detox treatment and CIWA protocol monitoring Continue Seroquel 50 mg twice daily for psychosis Continue trazodone 50 mg at bedtime for insomnia Appreciate psychiatric consultation and we sign off now  Please contact [phone] or [phone] if needs further  assistance  Disposition: Patient will benefit from the skilled nursing facility when medically stable and discharged from the hospital  Patient does not meet criteria for psychiatric inpatient admission. Supportive therapy provided about ongoing stressors.  Yashvi Jasinski,JANARDHAHA R. 04/02/2015 2:22 PM

## 2015-04-02 NOTE — Progress Notes (Signed)
Physical Therapy Treatment Patient Details Name: [name] MRN: [id number] DOB: 1955-07-24 Today's Date: April 05, 2015    History of Present Illness [name] is a 60 y.o. male has a past medical history significant for alcohol abuse, liver cirrhosis, anemia, pancytopenia, polysubstance abuse, admitted with probable Wernicke's encephalopathy.    PT Comments    Pt making steady progress. Continue to recommend ST-SNF  Follow Up Recommendations  SNF     Equipment Recommendations  None recommended by PT    Recommendations for Other Services       Precautions / Restrictions Precautions Precautions: Fall Restrictions Weight Bearing Restrictions: No    Mobility  Bed Mobility Overal bed mobility: Needs Assistance Bed Mobility: Supine to Sit     Supine to sit: Min assist     General bed mobility comments: Assist to elevate trunk into sitting  Transfers Overall transfer level: Needs assistance Equipment used: Rolling walker (2 wheeled) Transfers: Sit to/from Stand Sit to Stand: Min assist         General transfer comment: assist to bring hips up and for balance. Assist to bring  Ambulation/Gait Ambulation/Gait assistance: Min assist Ambulation Distance (Feet): 250 Feet Assistive device: Rolling walker (2 wheeled);1 person hand held assist Gait Pattern/deviations: Step-through pattern;Decreased stride length;Drifts right/left Gait velocity: decr Gait velocity interpretation: Below normal speed for age/gender General Gait Details: Assist for balance   Stairs            Wheelchair Mobility    Modified Rankin (Stroke Patients Only)       Balance     Sitting balance-Leahy Scale: Fair       Standing balance-Leahy Scale: Poor Standing balance comment: Requires UE support                    Cognition Arousal/Alertness: Awake/alert;Lethargic Behavior During Therapy: WFL for tasks assessed/performed Overall Cognitive Status:  Impaired/Different from baseline Area of Impairment: Safety/judgement;Awareness;Problem solving;Attention;Following commands   Current Attention Level: Sustained Memory: Decreased short-term memory;Decreased recall of precautions Following Commands: Follows one step commands consistently Safety/Judgement: Decreased awareness of safety;Decreased awareness of deficits Awareness: Intellectual Problem Solving: Slow processing;Requires verbal cues;Requires tactile cues      Exercises      General Comments        Pertinent Vitals/Pain Pain Assessment: No/denies pain Faces Pain Scale: No hurt    Home Living                      Prior Function            PT Goals (current goals can now be found in the care plan section) Progress towards PT goals: Progressing toward goals    Frequency  Min 2X/week    PT Plan Frequency needs to be updated;Current plan remains appropriate    Co-evaluation             End of Session Equipment Utilized During Treatment: Gait belt Activity Tolerance: Patient tolerated treatment well Patient left: with call bell/phone within reach;in chair;with chair alarm set     Time: [phone] PT Time Calculation (min) (ACUTE ONLY): 16 min  Charges:  $Gait Training: 8-22 mins                    G Codes:      Berkleigh Beckles 04-05-15, 2:30 PM [employer] PT [phone]

## 2015-04-02 NOTE — Progress Notes (Signed)
Patient seen and examined Vital signs stable Exam unchanged Remains confused-but more alert Spoke with neurology again today-no further recommendation while inpatient Continues to remain stable for discharge to SNF See discharge summary from yesterday for further details

## 2015-04-03 MED ORDER — TAMSULOSIN HCL 0.4 MG PO CAPS: 0.4000 mg | ORAL_CAPSULE | Freq: Every day | ORAL | Status: DC

## 2015-04-03 MED ORDER — DIAZEPAM 10 MG PO TABS: 5.0000 mg | ORAL_TABLET | Freq: Three times a day (TID) | ORAL | Status: DC | PRN

## 2015-04-03 MED ORDER — SPIRONOLACTONE 100 MG PO TABS: 100.0000 mg | ORAL_TABLET | Freq: Every day | ORAL | Status: DC

## 2015-04-03 MED ORDER — NICOTINE 21 MG/24HR TD PT24: 21.0000 mg | MEDICATED_PATCH | Freq: Every day | TRANSDERMAL | Status: DC

## 2015-04-03 MED ORDER — THIAMINE HCL 100 MG PO TABS: 100.0000 mg | ORAL_TABLET | Freq: Every day | ORAL | Status: DC

## 2015-04-03 MED ORDER — LACTULOSE 10 GM/15ML PO SOLN: 30.0000 g | Freq: Three times a day (TID) | ORAL | Status: DC

## 2015-04-03 MED ORDER — QUETIAPINE FUMARATE 50 MG PO TABS: 50.0000 mg | ORAL_TABLET | Freq: Two times a day (BID) | ORAL | Status: DC

## 2015-04-03 MED ORDER — TRAZODONE HCL 50 MG PO TABS: 50.0000 mg | ORAL_TABLET | Freq: Every day | ORAL | Status: DC

## 2015-04-03 NOTE — Progress Notes (Signed)
Patient seen and examined Vital signs stable He still is confused On exam Oriented 1 Moving all 4 extremities-attempted to get out of bed at times. Chest-bilaterally clear Cardiovascular-S1-S2 regular. Abdomen-soft and nontender  Assessment and plan Suspected Wernicke's encephalopathy-unfortunately no improvement in spite of 3 days of high-dose IV thiamine. Urology has no further recommendations while inpatient. Initially plans were for SNF-but patient has a 20% co-pay-family cannot afford that-will be discharged home with maximal home health services.  See discharge summary for further details.

## 2015-04-03 NOTE — Progress Notes (Signed)
NURSING PROGRESS NOTE  [name] [id number] Discharge Data: 04/03/2015 2:29 PM Attending Provider: Jonetta Osgood, MD [id number] [name], MD     [name] to be D/C'd Home with [facility] per MD order.  Discussed with the patient's wife the After Visit Summary and all questions fully answered. All IV's discontinued with no bleeding noted. All belongings returned to patient for patient to take home.   Last Vital Signs:  Blood pressure 104/66, pulse 77, temperature 97.2 F (36.2 C), temperature source Axillary, resp. rate 15, height 5\' 7"  (1.702 m), weight 70.3 kg (154 lb 15.7 oz), SpO2 98 %.  Discharge Medication List   Medication List    STOP taking these medications        acetaminophen 325 MG tablet  Commonly known as:  TYLENOL     ketoconazole 2 % cream  Commonly known as:  NIZORAL     multivitamin with minerals Tabs tablet     naproxen sodium 220 MG tablet  Commonly known as:  ANAPROX     nystatin 100000 UNIT/ML suspension  Commonly known as:  MYCOSTATIN      TAKE these medications        diazepam 10 MG tablet  Commonly known as:  VALIUM  Take 0.5-1 tablets (5-10 mg total) by mouth 3 (three) times daily as needed for anxiety (SEDATION).     folic acid A999333 MCG tablet  Commonly known as:  FOLVITE  Take 400 mcg by mouth daily.     lactulose 10 GM/15ML solution  Commonly known as:  CHRONULAC  Take 45 mLs (30 g total) by mouth 3 (three) times daily.     nicotine 21 mg/24hr patch  Commonly known as:  NICODERM CQ - dosed in mg/24 hours  Place 1 patch (21 mg total) onto the skin daily.     QUEtiapine 50 MG tablet  Commonly known as:  SEROQUEL  Take 1 tablet (50 mg total) by mouth 2 (two) times daily.     spironolactone 100 MG tablet  Commonly known as:  ALDACTONE  Take 1 tablet (100 mg total) by mouth daily.     tamsulosin 0.4 MG Caps capsule  Commonly known as:  FLOMAX  Take 1 capsule (0.4 mg total) by mouth daily.     thiamine 100 MG tablet   Take 1 tablet (100 mg total) by mouth daily.     traZODone 50 MG tablet  Commonly known as:  DESYREL  Take 1 tablet (50 mg total) by mouth at bedtime.         Doristine Devoid, RN

## 2015-04-15 ENCOUNTER — Ambulatory Visit (INDEPENDENT_AMBULATORY_CARE_PROVIDER_SITE_OTHER): Payer: 59 | Admitting: Diagnostic Neuroimaging

## 2015-04-15 ENCOUNTER — Encounter: Payer: Self-pay | Admitting: Diagnostic Neuroimaging

## 2015-04-15 VITALS — BP 103/63 | HR 97 | Ht 67.0 in | Wt 180.0 lb

## 2015-04-15 DIAGNOSIS — E512 Wernicke's encephalopathy: Secondary | ICD-10-CM

## 2015-04-15 DIAGNOSIS — F1020 Alcohol dependence, uncomplicated: Secondary | ICD-10-CM | POA: Diagnosis not present

## 2015-04-15 NOTE — Progress Notes (Signed)
[facility]  PATIENT: [name] DOB: October 21, 1955  REFERRING CLINICIAN: S Ghimire HISTORY FROM: patient and wife  REASON FOR VISIT: follow up    HISTORICAL  CHIEF COMPLAINT:  Chief Complaint  Patient presents with  . Wernicke encephalopathy    rm 7, New pt, wife- Harriett    HISTORY OF PRESENT ILLNESS:   60 year old male with long history of alcohol abuse, here for evaluation of wernicke's encephalopathy.  Patient was recently admitted to hospital from 03/27/15 until 04/03/15 for confusion, double vision and gait difficulty. He was diagnosed with suspected Wernicke's encephalopathy and treated with IV thiamine. Neurology and psychiatry to teams were consulted. Initially patient was planned to be discharged to skilled nursing facility unfortunately due to financial services patient was discharged home with home health services. Since discharge home patient is doing better not back to baseline.  Patient has greater than 10 year history of severe alcohol abuse, drinking greater than 12 beers per day. His health has been complicated by pancreatitis, anemia, thrombocytopenia, liver cirrhosis, depression and anxiety. Apparently patient stopped taking alcohol in early November 2016. In spite of this he has had several hospitalizations for confusion and metabolic disturbances.    REVIEW OF SYSTEMS: Full 14 system review of systems performed and notable only for memory loss confusion weakness slurred speech difficulty swallowing insomnia depression anxiety pain incontinence weight loss blurred vision eye pain.   ALLERGIES: Allergies  Allergen Reactions  . Aspirin Other (See Comments)    Has upset stomach in high doses only. Took 4 aspirin with EMS, no reaction.    HOME MEDICATIONS: Outpatient Prescriptions Prior to Visit  Medication Sig Dispense Refill  . folic acid (FOLVITE) A999333 MCG tablet Take 400 mcg by mouth daily.    Marland Kitchen lactulose (CHRONULAC) 10 GM/15ML  solution Take 45 mLs (30 g total) by mouth 3 (three) times daily. 946 mL 0  . QUEtiapine (SEROQUEL) 50 MG tablet Take 1 tablet (50 mg total) by mouth 2 (two) times daily. 60 tablet 0  . spironolactone (ALDACTONE) 100 MG tablet Take 1 tablet (100 mg total) by mouth daily. 30 tablet 0  . tamsulosin (FLOMAX) 0.4 MG CAPS capsule Take 1 capsule (0.4 mg total) by mouth daily. 30 capsule 0  . thiamine 100 MG tablet Take 1 tablet (100 mg total) by mouth daily. 30 tablet 0  . diazepam (VALIUM) 10 MG tablet Take 0.5-1 tablets (5-10 mg total) by mouth 3 (three) times daily as needed for anxiety (SEDATION). 30 tablet 0  . nicotine (NICODERM CQ - DOSED IN MG/24 HOURS) 21 mg/24hr patch Place 1 patch (21 mg total) onto the skin daily. 28 patch 0  . traZODone (DESYREL) 50 MG tablet Take 1 tablet (50 mg total) by mouth at bedtime. 30 tablet 0   No facility-administered medications prior to visit.    PAST MEDICAL HISTORY: Past Medical History  Diagnosis Date  . GERD (gastroesophageal reflux disease)   . 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 Transfusion history 08-16-12    '99  . Pancreatitis 08-16-12    at present weakness,fatiques easily  . Foreign body 08-16-12    nose"BB" pellet since age 52  . History of ETOH abuse 08-16-12    Heavy alcohol use daily  . Anemia   . Anxiety   . Clotting disorder ([place])   . Substance abuse   . Cirrhosis ([place])   . Wernicke encephalopathy   . Cirrhosis of liver ([facility])     PAST SURGICAL  HISTORY: Past Surgical History  Procedure Laterality Date  . Neck surgery      fusion of neck-titanium hardware inplaced  . Esophagogastroduodenoscopy    . Colonoscopy    . Eus N/A 08/18/2012    Procedure: UPPER ENDOSCOPIC ULTRASOUND (EUS) LINEAR;  Surgeon: Beryle Beams, MD;  Location: [facility];  Service: Endoscopy;  Laterality: N/A;  . Neck surgery  [id number]    FAMILY HISTORY: Family History  Problem Relation Age of Onset  . Hypertension Father   . Cancer Mother 58     lung  . Stroke Mother     SOCIAL HISTORY:  Social History   Social History  . Marital Status: Married    Spouse Name: Harriett  . Number of Children: 2  . Years of Education: 9   Occupational History  .      disabled   Social History Main Topics  . Smoking status: Former Smoker    Types: Cigarettes    Quit date: 08/16/2005  . Smokeless tobacco: Never Used  . Alcohol Use: 50.4 oz/week    84 Cans of beer per week     Comment: 12 cans beer per day, quit 01/2015  . Drug Use: No     Comment: 03/2015 quit 20 yrs ago  . Sexual Activity: Yes   Other Topics Concern  . Not on file   Social History Narrative   Lives at home with wife   Caffeine use- sodas 2 a day     PHYSICAL EXAM  GENERAL EXAM/CONSTITUTIONAL: Vitals:  Filed Vitals:   04/15/15 1500  BP: 103/63  Pulse: 97  Height: 5\' 7"  (1.702 m)  Weight: 180 lb (81.647 kg)     Body mass index is 28.19 kg/(m^2).  No exam data present  Patient is in no distress; well developed, nourished and groomed; neck is supple  CARDIOVASCULAR:  Examination of carotid arteries is normal; no carotid bruits  Regular rate and rhythm, no murmurs  Examination of peripheral vascular system by observation and palpation is normal  EYES:  Ophthalmoscopic exam of optic discs and posterior segments is normal; no papilledema or hemorrhages  MUSCULOSKELETAL:  Gait, strength, tone, movements noted in Neurologic exam below  NEUROLOGIC: MENTAL STATUS:  No flowsheet data found.  awake, alert, oriented to person, place and time  recent and remote memory intact  normal attention and concentration  language fluent, comprehension intact, naming intact,   fund of knowledge appropriate  CRANIAL NERVE:   2nd - no papilledema on fundoscopic exam  2nd, 3rd, 4th, 6th - pupils equal and reactive to light, visual fields full to confrontation, extraocular muscles intact, no nystagmus  5th - facial sensation symmetric  7th -  facial strength symmetric  8th - hearing intact  9th - palate elevates symmetrically, uvula midline  11th - shoulder shrug symmetric  12th - tongue protrusion midline  MOTOR:   normal bulk and tone, full strength in the BUE, BLE  SENSORY:   normal and symmetric to light touch, temperature, vibration  COORDINATION:   finger-nose-finger, fine finger movements SLOW; MILD DYSMETRIA  REFLEXES:   deep tendon reflexes TRACE and symmetric  GAIT/STATION:   narrow based gait; SLOW SLIGHTLY ATAXIC    DIAGNOSTIC DATA (LABS, IMAGING, TESTING) - I reviewed patient records, labs, notes, testing and imaging myself where available.  Lab Results  Component Value Date   WBC 4.8 04/01/2015   HGB 10.0* 04/01/2015   HCT 28.9* 04/01/2015   MCV 92.0 04/01/2015   PLT  93* 04/01/2015      Component Value Date/Time   NA 134* 04/01/2015 0715   K 4.0 04/01/2015 0715   CL 104 04/01/2015 0715   CO2 21* 04/01/2015 0715   GLUCOSE 96 04/01/2015 0715   BUN 20 04/01/2015 0715   CREATININE 1.15 04/01/2015 0715   CALCIUM 9.8 04/01/2015 0715   PROT 7.7 03/31/2015 0638   ALBUMIN 3.0* 03/31/2015 0638   AST 32 03/31/2015 0638   ALT 14* 03/31/2015 0638   ALKPHOS 69 03/31/2015 0638   BILITOT 1.1 03/31/2015 0638   GFRNONAA >60 04/01/2015 0715   GFRAA >60 04/01/2015 0715   Lab Results  Component Value Date   CHOL  06/15/2007    149        ATP III CLASSIFICATION:  <200     mg/dL   Desirable  200-239  mg/dL   Borderline High  >=240    mg/dL   High   HDL 17* 06/15/2007   LDLCALC  06/15/2007    98        Total Cholesterol/HDL:CHD Risk Coronary Heart Disease Risk Table                     Men   Women  1/2 Average Risk   3.4   3.3   TRIG 170* 06/15/2007   CHOLHDL 8.8 06/15/2007   No results found for: HGBA1C Lab Results  Component Value Date   VITAMINB12 712 03/28/2015   Lab Results  Component Value Date   TSH 3.277 03/28/2015    03/20/15 MRI brain [I reviewed images myself and  agree with interpretation. -VRP]  1. Incomplete examination with motion and magnetic susceptibility artifact as above. 2. No acute intracranial abnormality identified. 3. Mildly advanced cerebral atrophy for age.  03/29/15 EEG  - Generalized non-specific cerebral dysfunction(encephalopathy). There was no seizure or seizure predisposition recorded on this study.     ASSESSMENT AND PLAN  60 y.o. year old male here with long history of alcohol abuse, with recurrent confusional episodes in setting of metabolic disturbance and suspected Wernicke's encephalopathy. Symptoms slightly improved but not back to baseline.  Dx:  Wernicke's encephalopathy  Alcohol use disorder, severe, dependence ([place])    PLAN: - monitor symptoms - reviewed importance of nutrition, physical activity, stress mgmt and sleep  Return if symptoms worsen or fail to improve, for return to PCP.    Penni Bombard, MD [id number], [id number] PM Certified in Neurology, Neurophysiology and Neuroimaging  [facility] [address], [place], [place] [address] [phone]

## 2015-04-15 NOTE — Patient Instructions (Signed)
Thank you for coming to see [place] at [facility]. I hope we have been able to provide you high quality care today.  You may receive a patient satisfaction survey over the next few weeks. We would appreciate your feedback and comments so that we may continue to improve ourselves and the health of our patients.  - continue current medications - monitor symptoms   ~~~~~~~~~~~~~~~~~~~~~~~~~~~~~~~~~~~~~~~~~~~~~~~~~~~~~~~~~~~~~~~~~  DR. PENUMALLI'S GUIDE TO HAPPY AND HEALTHY LIVING These are some of my general health and wellness recommendations. Some of them may apply to you better than others. Please use common sense as you try these suggestions and feel free to ask me any questions.   ACTIVITY/FITNESS Mental, social, emotional and physical stimulation are very important for brain and body health. Try learning a new activity (arts, music, language, sports, games).  Keep moving your body to the best of your abilities. You can do this at home, inside or outside, the park, community center, gym or anywhere you like. Consider a physical therapist or personal trainer to get started. Consider the app Sworkit. Fitness trackers such as smart-watches, smart-phones or Fitbits can help as well.   NUTRITION Eat more plants: colorful vegetables, nuts, seeds and berries.  Eat less sugar, salt, preservatives and processed foods.  Avoid toxins such as cigarettes and alcohol.  Drink water when you are thirsty. Warm water with a slice of lemon is an excellent morning drink to start the day.  Consider these websites for more information The Nutrition Source (https://www.henry-hernandez.biz/) Precision Nutrition (WindowBlog.ch)   RELAXATION Consider practicing mindfulness meditation or other relaxation techniques such as deep breathing, prayer, yoga, tai chi, massage. See website mindful.org or the apps Headspace or Calm to help get  started.   SLEEP Try to get at least 7-8+ hours sleep per day. Regular exercise and reduced caffeine will help you sleep better. Practice good sleep hygeine techniques. See website sleep.org for more information.   PLANNING Prepare estate planning, living will, healthcare POA documents. Sometimes this is best planned with the help of an attorney. Theconversationproject.org and agingwithdignity.org are excellent resources.

## 2015-06-11 NOTE — H&P (Signed)
  NTS SOAP Note  Vital Signs:  Vitals as of: 123456: Systolic 123456: Diastolic 72: Heart Rate 86: Temp 98.30F (Temporal): Height 25ft 7.5in: Weight 195Lbs 0 Ounces: BMI 30.09   BMI : 30.09 kg/m2  Subjective: This 60 year old male presents for of an umbilical hernia.  Has been present for some time.  Causes occassional discomfort.  No fevers.  No drainage.  Stopped drinking last 11/16.  Review of Symptoms:  Constitutional:fatigue Head:unremarkable Eyes:blurred vision bilateral Nose/Mouth/Throat:unremarkable Cardiovascular:  unremarkable Respiratory:wheezing, cough Gastrointestinal:  unremarkable   Genitourinary:frequency joint, neck, and back pain Skin:unremarkable h/o low platelets Allergic/Immunologic:unremarkable   Past Medical History:  Reviewed  Past Medical History  Surgical History: noncontributory Medical Problems: HTN, liver disease, thrombocytopenia, h/o alcohol abuse, Weneike's encephalopathy Allergies: asa Medications: folic acid, spironolactone, trazadone, lactulose   Social History:Reviewed  Social History  Preferred Language: English Race:  White Ethnicity: Not Hispanic / Latino Age: 60 year Marital Status:  S Alcohol: quit drinking 11/16   Smoking Status: Never smoker reviewed on 06/10/2015 Functional Status reviewed on 06/10/2015 ------------------------------------------------ Bathing: Normal Cooking: Normal Dressing: Normal Driving: Normal Eating: Normal Managing Meds: Normal Oral Care: Normal Shopping: Normal Toileting: Normal Transferring: Normal Walking: Normal Cognitive Status reviewed on 06/10/2015 ------------------------------------------------ Attention: Normal Decision Making: Normal Language: Normal Memory: Normal Motor: Normal Perception: Normal Problem Solving: Normal Visual and Spatial: Normal   Family History:Reviewed  Family Health History Mother, Unknown; Lung cancer;  Father      Objective Information: General:Well appearing, well nourished in no distress. Heart:RRR, no murmur or gallop.  Normal S1, S2.  No S3, S4.  Lungs:  CTA bilaterally, no wheezes, rhonchi, rales.  Breathing unlabored. Abdomen:Soft, NT/ND, no HSM, no masses.  No appreciable ascites.  Reducible umbilical hernia with thin skin present. Recent labs reviewed.  Plt ct 93,000, LFT's wnl Assessment:Umbilical hernia  Diagnoses: [id number]  [id number] Umbilical hernia (Umbilical hernia without obstruction or gangrene)  Procedures: [id number] - OFFICE OUTPATIENT NEW 30 MINUTES    Plan:  Scheduled for umbilical herniorrhaphy with mesh on 06/18/15.  Will check INR, CBC, and other labs prior to surgery.   Patient Education:Alternative treatments to surgery were discussed with patient (and family).  Risks and benefits  of procedure including bleeding, infection, mesh use, and the possibility of recurrence of the hernia were fully explained to the patient (and family) who gave informed consent. Patient/family questions were addressed.  Follow-up:Pending Surgery

## 2015-06-11 NOTE — Patient Instructions (Addendum)
Your procedure is scheduled on: 06/18/2015  Report to [facility] at 7:30    AM.  Call this number if you have problems the morning of surgery: [phone]   Remember:   Do not drink or eat food:After Midnight.  :  Take these medicines the morning of surgery with A SIP OF WATER: Seroquel   Do not wear jewelry, make-up or nail polish.  Do not wear lotions, powders, or perfumes. You may wear deodorant.  Do not shave 48 hours prior to surgery. Men may shave face and neck.  Do not bring valuables to the hospital.  Contacts, dentures or bridgework may not be worn into surgery.  Leave suitcase in the car. After surgery it may be brought to your room.  For patients admitted to the hospital, checkout time is 11:00 AM the day of discharge.   Patients discharged the day of surgery will not be allowed to drive home.    Special Instructions: Shower using CHG night before surgery and shower the day of surgery use CHG.  Use special wash - you have one bottle of CHG for all showers.  You should use approximately 1/2 of the bottle for each shower. Umbilical Herniorrhaphy, Care After Refer to this sheet in the next few weeks. These instructions provide you with information on caring for yourself after your procedure. Your health care provider may also give you more specific instructions. Your treatment has been planned according to current medical practices, but problems sometimes occur. Call your health care provider if you have any problems or questions after your procedure. HOME CARE INSTRUCTIONS  If you are given antibiotic medicine, take it as directed. Finish it even if you start to feel better.  Only take over-the-counter or prescription medicines for pain, fever, or discomfort as directed by your health care provider. Do not take aspirin. It can cause bleeding.  Do not get your surgical cut (incision) area wet unless your health care provider says it is okay.  Avoid lifting objects heavier  than 10 lb (4.5 kg) for 8 weeks after surgery.  Avoid sexual activity for 5 weeks after surgery or as directed by your health care provider.  Do not drive while taking prescription pain medicine.  You may return to your other normal, daily activities after 3 days or as directed by your health care provider. SEEK MEDICAL CARE IF:  You notice blood or fluid leaking from the surgical site.  Your incision area becomes red or swollen.  Your pain at the surgical site becomes worse or is not relieved by medicine.  You have problems urinating.  You feel nauseous or vomit more than 2 days after surgery.  You notice the bulge in your abdomen returns after the procedure. SEEK IMMEDIATE MEDICAL CARE IF:  You have a fever.  You have nausea or vomiting that will not stop.   This information is not intended to replace advice given to you by your health care provider. Make sure you discuss any questions you have with your health care provider.   Document Released: 08/31/2011 Document Revised: 03/22/2014 Document Reviewed: 08/31/2011 Elsevier Interactive Patient Education 2016 [place] Anesthesia, Adult, Care After Refer to this sheet in the next few weeks. These instructions provide you with information on caring for yourself after your procedure. Your health care provider may also give you more specific instructions. Your treatment has been planned according to current medical practices, but problems sometimes occur. Call your health care provider if you have  any problems or questions after your procedure. WHAT TO EXPECT AFTER THE PROCEDURE After the procedure, it is typical to experience:  Sleepiness.  Nausea and vomiting. HOME CARE INSTRUCTIONS  For the first 24 hours after general anesthesia:  Have a responsible person with you.  Do not drive a car. If you are alone, do not take public transportation.  Do not drink alcohol.  Do not take medicine that has not been  prescribed by your health care provider.  Do not sign important papers or make important decisions.  You may resume a normal diet and activities as directed by your health care provider.  Change bandages (dressings) as directed.  If you have questions or problems that seem related to general anesthesia, call the hospital and ask for the anesthetist or anesthesiologist on call. SEEK MEDICAL CARE IF:  You have nausea and vomiting that continue the day after anesthesia.  You develop a rash. SEEK IMMEDIATE MEDICAL CARE IF:   You have difficulty breathing.  You have chest pain.  You have any allergic problems.   This information is not intended to replace advice given to you by your health care provider. Make sure you discuss any questions you have with your health care provider.   Document Released: 06/07/2000 Document Revised: 03/22/2014 Document Reviewed: 06/30/2011 Elsevier Interactive Patient Education [employer].

## 2015-06-13 ENCOUNTER — Encounter (HOSPITAL_COMMUNITY)
Admission: RE | Admit: 2015-06-13 | Discharge: 2015-06-13 | Disposition: A | Discharge status: Home / Self Care | Payer: 59 | Source: Ambulatory Visit | Attending: General Surgery | Admitting: General Surgery

## 2015-06-13 ENCOUNTER — Encounter (HOSPITAL_COMMUNITY): Payer: Self-pay

## 2015-06-13 DIAGNOSIS — K7460 Unspecified cirrhosis of liver: Secondary | ICD-10-CM | POA: Diagnosis not present

## 2015-06-13 DIAGNOSIS — I10 Essential (primary) hypertension: Secondary | ICD-10-CM | POA: Diagnosis not present

## 2015-06-13 DIAGNOSIS — K429 Umbilical hernia without obstruction or gangrene: Secondary | ICD-10-CM | POA: Diagnosis not present

## 2015-06-13 DIAGNOSIS — D649 Anemia, unspecified: Secondary | ICD-10-CM | POA: Diagnosis not present

## 2015-06-13 LAB — CBC WITH DIFFERENTIAL/PLATELET
BASOS ABS: 0 10*3/uL (ref 0.0–0.1)
Basophils Relative: 1 %
EOS PCT: 7 %
Eosinophils Absolute: 0.2 10*3/uL (ref 0.0–0.7)
HEMATOCRIT: 29.1 % — AB (ref 39.0–52.0)
Hemoglobin: 9.8 g/dL — ABNORMAL LOW (ref 13.0–17.0)
LYMPHS ABS: 0.8 10*3/uL (ref 0.7–4.0)
LYMPHS PCT: 32 %
MCH: 32.5 pg (ref 26.0–34.0)
MCHC: 33.7 g/dL (ref 30.0–36.0)
MCV: 96.4 fL (ref 78.0–100.0)
MONO ABS: 0.3 10*3/uL (ref 0.1–1.0)
MONOS PCT: 10 %
Neutro Abs: 1.3 10*3/uL — ABNORMAL LOW (ref 1.7–7.7)
Neutrophils Relative %: 50 %
Platelets: 76 10*3/uL — ABNORMAL LOW (ref 150–400)
RBC: 3.02 MIL/uL — ABNORMAL LOW (ref 4.22–5.81)
RDW: 14.5 % (ref 11.5–15.5)
WBC: 2.6 10*3/uL — ABNORMAL LOW (ref 4.0–10.5)

## 2015-06-13 LAB — PROTIME-INR
INR: 1.23 (ref 0.00–1.49)
Prothrombin Time: 15.6 seconds — ABNORMAL HIGH (ref 11.6–15.2)

## 2015-06-13 LAB — COMPREHENSIVE METABOLIC PANEL
ALBUMIN: 3.5 g/dL (ref 3.5–5.0)
ALK PHOS: 88 U/L (ref 38–126)
ALT: 11 U/L — AB (ref 17–63)
AST: 25 U/L (ref 15–41)
Anion gap: 6 (ref 5–15)
BILIRUBIN TOTAL: 0.6 mg/dL (ref 0.3–1.2)
BUN: 24 mg/dL — AB (ref 6–20)
CO2: 24 mmol/L (ref 22–32)
CREATININE: 0.85 mg/dL (ref 0.61–1.24)
Calcium: 9.2 mg/dL (ref 8.9–10.3)
Chloride: 107 mmol/L (ref 101–111)
GFR calc Af Amer: >60 mL/min (ref >60)
GLUCOSE: 111 mg/dL — AB (ref 65–99)
POTASSIUM: 4.4 mmol/L (ref 3.5–5.1)
Sodium: 137 mmol/L (ref 135–145)
TOTAL PROTEIN: 7.1 g/dL (ref 6.5–8.1)

## 2015-06-13 NOTE — Pre-Procedure Instructions (Signed)
Patient given information to sign up for my chart at home. 

## 2015-06-16 ENCOUNTER — Encounter (HOSPITAL_COMMUNITY)
Admission: RE | Admit: 2015-06-16 | Discharge: 2015-06-16 | Disposition: A | Discharge status: Home / Self Care | Payer: 59 | Source: Ambulatory Visit | Attending: General Surgery | Admitting: General Surgery

## 2015-06-16 DIAGNOSIS — K429 Umbilical hernia without obstruction or gangrene: Secondary | ICD-10-CM | POA: Diagnosis not present

## 2015-06-16 LAB — TYPE AND SCREEN
ABO/RH(D): A POS
ANTIBODY SCREEN: NEGATIVE

## 2015-06-16 NOTE — OR Nursing (Signed)
Dr. Patsey Berthold aware of hemoglobin 9.8, and platelets 76

## 2015-06-18 ENCOUNTER — Ambulatory Visit (HOSPITAL_COMMUNITY): Payer: 59 | Admitting: Anesthesiology

## 2015-06-18 ENCOUNTER — Ambulatory Visit (HOSPITAL_COMMUNITY)
Admission: RE | Admit: 2015-06-18 | Discharge: 2015-06-18 | Disposition: A | Discharge status: Home / Self Care | Payer: 59 | Source: Ambulatory Visit | Attending: General Surgery | Admitting: General Surgery

## 2015-06-18 ENCOUNTER — Encounter (HOSPITAL_COMMUNITY)
Admission: RE | Disposition: A | Discharge status: Home / Self Care | Payer: Self-pay | Source: Ambulatory Visit | Attending: General Surgery

## 2015-06-18 DIAGNOSIS — K429 Umbilical hernia without obstruction or gangrene: Secondary | ICD-10-CM | POA: Insufficient documentation

## 2015-06-18 DIAGNOSIS — I10 Essential (primary) hypertension: Secondary | ICD-10-CM | POA: Insufficient documentation

## 2015-06-18 DIAGNOSIS — D649 Anemia, unspecified: Secondary | ICD-10-CM | POA: Insufficient documentation

## 2015-06-18 DIAGNOSIS — K7460 Unspecified cirrhosis of liver: Secondary | ICD-10-CM | POA: Insufficient documentation

## 2015-06-18 HISTORY — PX: INSERTION OF MESH: SHX5868

## 2015-06-18 HISTORY — PX: UMBILICAL HERNIA REPAIR: SHX196

## 2015-06-18 SURGERY — REPAIR, HERNIA, UMBILICAL, ADULT
Anesthesia: General | Site: Abdomen

## 2015-06-18 MED ORDER — MIDAZOLAM HCL 2 MG/2ML IJ SOLN
1.0000 mg | INTRAMUSCULAR | Status: DC | PRN
  Administered 2015-06-18: 2 mg via INTRAVENOUS

## 2015-06-18 MED ORDER — POVIDONE-IODINE 10 % OINT PACKET
TOPICAL_OINTMENT | CUTANEOUS | Status: DC | PRN
  Administered 2015-06-18: 1 via TOPICAL

## 2015-06-18 MED ORDER — LACTATED RINGERS IV SOLN
INTRAVENOUS | Status: DC
  Administered 2015-06-18: 75 mL/h via INTRAVENOUS

## 2015-06-18 MED ORDER — BUPIVACAINE HCL (PF) 0.5 % IJ SOLN
INTRAMUSCULAR | Status: AC
  Filled 2015-06-18: qty 30

## 2015-06-18 MED ORDER — FENTANYL CITRATE (PF) 100 MCG/2ML IJ SOLN
INTRAMUSCULAR | Status: DC | PRN
  Administered 2015-06-18 (×2): 25 ug via INTRAVENOUS
  Administered 2015-06-18: 50 ug via INTRAVENOUS

## 2015-06-18 MED ORDER — CHLORHEXIDINE GLUCONATE 4 % EX LIQD: 1.0000 "application " | Freq: Once | CUTANEOUS | Status: DC

## 2015-06-18 MED ORDER — ONDANSETRON HCL 4 MG/2ML IJ SOLN
4.0000 mg | Freq: Once | INTRAMUSCULAR | Status: AC
  Administered 2015-06-18: 4 mg via INTRAVENOUS

## 2015-06-18 MED ORDER — MIDAZOLAM HCL 5 MG/5ML IJ SOLN
INTRAMUSCULAR | Status: DC | PRN
  Administered 2015-06-18: 2 mg via INTRAVENOUS

## 2015-06-18 MED ORDER — LIDOCAINE HCL 1 % IJ SOLN
INTRAMUSCULAR | Status: DC | PRN
  Administered 2015-06-18: 25 mg via INTRADERMAL

## 2015-06-18 MED ORDER — OXYCODONE HCL 5 MG PO TABS: 5.0000 mg | ORAL_TABLET | ORAL | Status: DC | PRN

## 2015-06-18 MED ORDER — PROPOFOL 10 MG/ML IV BOLUS
INTRAVENOUS | Status: AC
  Filled 2015-06-18: qty 20

## 2015-06-18 MED ORDER — MIDAZOLAM HCL 2 MG/2ML IJ SOLN
INTRAMUSCULAR | Status: AC
  Filled 2015-06-18: qty 2

## 2015-06-18 MED ORDER — CEFAZOLIN SODIUM-DEXTROSE 2-4 GM/100ML-% IV SOLN
INTRAVENOUS | Status: AC
  Filled 2015-06-18: qty 100

## 2015-06-18 MED ORDER — FENTANYL CITRATE (PF) 100 MCG/2ML IJ SOLN
25.0000 ug | INTRAMUSCULAR | Status: AC
  Administered 2015-06-18: 25 ug via INTRAVENOUS

## 2015-06-18 MED ORDER — BUPIVACAINE HCL (PF) 0.5 % IJ SOLN
INTRAMUSCULAR | Status: DC | PRN
  Administered 2015-06-18: 10 mL

## 2015-06-18 MED ORDER — POVIDONE-IODINE 10 % EX OINT
TOPICAL_OINTMENT | CUTANEOUS | Status: AC
  Filled 2015-06-18: qty 1

## 2015-06-18 MED ORDER — FENTANYL CITRATE (PF) 100 MCG/2ML IJ SOLN: 25.0000 ug | INTRAMUSCULAR | Status: DC | PRN

## 2015-06-18 MED ORDER — ONDANSETRON HCL 4 MG/2ML IJ SOLN: 4.0000 mg | Freq: Once | INTRAMUSCULAR | Status: DC | PRN

## 2015-06-18 MED ORDER — PROPOFOL 10 MG/ML IV BOLUS
INTRAVENOUS | Status: DC | PRN
  Administered 2015-06-18: 20 mg via INTRAVENOUS
  Administered 2015-06-18: 110 mg via INTRAVENOUS

## 2015-06-18 MED ORDER — CEFAZOLIN SODIUM-DEXTROSE 2-4 GM/100ML-% IV SOLN
2.0000 g | INTRAVENOUS | Status: AC
  Administered 2015-06-18: 2 g via INTRAVENOUS

## 2015-06-18 MED ORDER — FENTANYL CITRATE (PF) 100 MCG/2ML IJ SOLN
INTRAMUSCULAR | Status: AC
  Filled 2015-06-18: qty 2

## 2015-06-18 MED ORDER — FENTANYL CITRATE (PF) 250 MCG/5ML IJ SOLN
INTRAMUSCULAR | Status: AC
  Filled 2015-06-18: qty 5

## 2015-06-18 MED ORDER — 0.9 % SODIUM CHLORIDE (POUR BTL) OPTIME
TOPICAL | Status: DC | PRN
  Administered 2015-06-18: 1000 mL

## 2015-06-18 MED ORDER — ONDANSETRON HCL 4 MG/2ML IJ SOLN
INTRAMUSCULAR | Status: AC
  Filled 2015-06-18: qty 2

## 2015-06-18 SURGICAL SUPPLY — 35 items
BAG HAMPER (MISCELLANEOUS) ×3 IMPLANT
BLADE SURG SZ11 CARB STEEL (BLADE) ×3 IMPLANT
CHLORAPREP W/TINT 26ML (MISCELLANEOUS) ×3 IMPLANT
CLOTH BEACON ORANGE TIMEOUT ST (SAFETY) ×3 IMPLANT
COVER LIGHT HANDLE STERIS (MISCELLANEOUS) ×6 IMPLANT
DECANTER SPIKE VIAL GLASS SM (MISCELLANEOUS) ×3 IMPLANT
ELECT REM PT RETURN 9FT ADLT (ELECTROSURGICAL) ×3
ELECTRODE REM PT RTRN 9FT ADLT (ELECTROSURGICAL) ×1 IMPLANT
GLOVE BIOGEL PI IND STRL 7.0 (GLOVE) ×1 IMPLANT
GLOVE BIOGEL PI INDICATOR 7.0 (GLOVE) ×2
GLOVE ECLIPSE 6.5 STRL STRAW (GLOVE) ×2 IMPLANT
GLOVE EXAM NITRILE MD LF STRL (GLOVE) ×2 IMPLANT
GLOVE SURG SS PI 7.5 STRL IVOR (GLOVE) ×6 IMPLANT
GOWN STRL REUS W/ TWL LRG LVL3 (GOWN DISPOSABLE) ×1 IMPLANT
GOWN STRL REUS W/TWL LRG LVL3 (GOWN DISPOSABLE) ×9 IMPLANT
INST SET MINOR GENERAL (KITS) ×3 IMPLANT
KIT ROOM TURNOVER APOR (KITS) ×3 IMPLANT
MANIFOLD NEPTUNE II (INSTRUMENTS) ×3 IMPLANT
MESH VENTRALEX ST 2.5 CRC MED (Mesh General) ×3 IMPLANT
NDL HYPO 25X1 1.5 SAFETY (NEEDLE) ×1 IMPLANT
NEEDLE HYPO 25X1 1.5 SAFETY (NEEDLE) ×3 IMPLANT
NS IRRIG 1000ML POUR BTL (IV SOLUTION) ×3 IMPLANT
PACK MINOR (CUSTOM PROCEDURE TRAY) ×3 IMPLANT
PAD ARMBOARD 7.5X6 YLW CONV (MISCELLANEOUS) ×3 IMPLANT
SET BASIN LINEN APH (SET/KITS/TRAYS/PACK) ×3 IMPLANT
SPONGE GAUZE 2X2 8PLY STER LF (GAUZE/BANDAGES/DRESSINGS) ×1
SPONGE GAUZE 2X2 8PLY STRL LF (GAUZE/BANDAGES/DRESSINGS) ×3 IMPLANT
STAPLER VISISTAT (STAPLE) ×3 IMPLANT
SUT ETHIBOND NAB MO 7 #0 18IN (SUTURE) ×3 IMPLANT
SUT VIC AB 2-0 CT2 27 (SUTURE) ×3 IMPLANT
SUT VIC AB 3-0 SH 27 (SUTURE) ×6
SUT VIC AB 3-0 SH 27X BRD (SUTURE) ×1 IMPLANT
SUT VICRYL AB 3 0 TIES (SUTURE) IMPLANT
SYR CONTROL 10ML LL (SYRINGE) ×3 IMPLANT
TAPE CLOTH SURG 4X10 WHT LF (GAUZE/BANDAGES/DRESSINGS) ×2 IMPLANT

## 2015-06-18 NOTE — Op Note (Signed)
Patient:  [name]  DOB:  06-13-1955  MRN:  [id number]   Preop Diagnosis:  Umbilical hernia  Postop Diagnosis:  Same  Procedure:  Umbilical herniorrhaphy with mesh  Surgeon:  Aviva Signs, M.D.  Anes:  Gen.  Indications:  Patient is a 60 year old white male with multiple medical problems including thrombocytopenia and cirrhosis who presents with an umbilical hernia. The risks and benefits of the procedure including bleeding, infection, mesh use, and the possibility of recurrence of the hernia were fully explained to the patient, who gave informed consent.  Procedure note:  The patient was placed the supine position. After general anesthesia was administered, the abdomen was prepped and draped using usual the sterile technique with DuraPrep. Surgical site confirmation was performed.  An infraumbilical incision was made down to the fascia. The umbilicus was freed away from the underlying hernia sac. The hernia sac was excised and disposed of. The defect measured approximately 2-1/2 cm in its greatest diameter. A Bard 6.4 cm ventralax ST patch was inserted and secured to the fascia using 0 Ethibond interrupted sutures. The overlying fascia was reapproximated transversely using 0 Ethibond interrupted sutures. The umbilicus was secured back to the fascia using a 2-0 Vicryl interrupted suture. The subcutaneous layer was reapproximated using 3-0 Vicryl interrupted suture. The skin was closed using staples. 0.5% Sensorcaine was instilled the surrounding wound. Betadine ointment was then applied.  All tape and needle counts were correct at the end of the procedure. The patient was awakened and transferred to PACU in stable condition.  Complications:  None  EBL:  Minimal  Specimen:  None

## 2015-06-18 NOTE — Discharge Instructions (Signed)
Umbilical Herniorrhaphy, Care After °Refer to this sheet in the next few weeks. These instructions provide you with information on caring for yourself after your procedure. Your health care provider may also give you more specific instructions. Your treatment has been planned according to current medical practices, but problems sometimes occur. Call your health care provider if you have any problems or questions after your procedure. °HOME CARE INSTRUCTIONS °· If you are given antibiotic medicine, take it as directed. Finish it even if you start to feel better. °· Only take over-the-counter or prescription medicines for pain, fever, or discomfort as directed by your health care provider. Do not take aspirin. It can cause bleeding. °· Do not get your surgical cut (incision) area wet unless your health care provider says it is okay. °· Avoid lifting objects heavier than 10 lb (4.5 kg) for 8 weeks after surgery. °· Avoid sexual activity for 5 weeks after surgery or as directed by your health care provider. °· Do not drive while taking prescription pain medicine. °· You may return to your other normal, daily activities after 3 days or as directed by your health care provider. °SEEK MEDICAL CARE IF: °· You notice blood or fluid leaking from the surgical site. °· Your incision area becomes red or swollen. °· Your pain at the surgical site becomes worse or is not relieved by medicine. °· You have problems urinating. °· You feel nauseous or vomit more than 2 days after surgery. °· You notice the bulge in your abdomen returns after the procedure. °SEEK IMMEDIATE MEDICAL CARE IF: °· You have a fever. °· You have nausea or vomiting that will not stop. °  °This information is not intended to replace advice given to you by your health care provider. Make sure you discuss any questions you have with your health care provider. °  °Document Released: 08/31/2011 Document Revised: 03/22/2014 Document Reviewed: 08/31/2011 °Elsevier  Interactive Patient Education ©2016 Elsevier Inc. ° °

## 2015-06-18 NOTE — Anesthesia Preprocedure Evaluation (Signed)
Anesthesia Evaluation  Patient identified by MRN, date of birth, ID band Patient awake    Reviewed: Allergy & Precautions, H&P , NPO status , Patient's Chart, lab work & pertinent test results  Airway Mallampati: II  TM Distance: >3 FB Neck ROM: full    Dental  (+) Edentulous Upper, Edentulous Lower   Pulmonary neg pulmonary ROS, former smoker,    Pulmonary exam normal breath sounds clear to auscultation       Cardiovascular Exercise Tolerance: Good hypertension, Pt. on medications negative cardio ROS Normal cardiovascular exam Rhythm:regular Rate:Normal  Untreated htn   Neuro/Psych PSYCHIATRIC DISORDERS Anxiety Wernicke encephalopathy negative neurological ROS  negative psych ROS   GI/Hepatic neg GERD  ,(+) Cirrhosis     substance abuse  alcohol use, Pancreatitis  pancreatitis   Endo/Other  negative endocrine ROS  Renal/GU negative Renal ROS  negative genitourinary   Musculoskeletal   Abdominal   Peds  Hematology negative hematology ROS (+) Blood dyscrasia (thrombocytopenia), anemia ,   Anesthesia Other Findings   Reproductive/Obstetrics negative OB ROS                             Anesthesia Physical Anesthesia Plan  ASA: IV  Anesthesia Plan: General   Post-op Pain Management:    Induction: Intravenous  Airway Management Planned: LMA  Additional Equipment:   Intra-op Plan:   Post-operative Plan: Extubation in OR  Informed Consent: I have reviewed the patients History and Physical, chart, labs and discussed the procedure including the risks, benefits and alternatives for the proposed anesthesia with the patient or authorized representative who has indicated his/her understanding and acceptance.     Plan Discussed with:   Anesthesia Plan Comments:         Anesthesia Quick Evaluation

## 2015-06-18 NOTE — Transfer of Care (Signed)
Immediate Anesthesia Transfer of Care Note  Patient: [name]  Procedure(s) Performed: Procedure(s): HERNIA REPAIR UMBILICAL ADULT WITH MESH (N/A) INSERTION OF MESH (N/A)  Patient Location: PACU  Anesthesia Type:General  Level of Consciousness: awake and patient cooperative  Airway & Oxygen Therapy: Patient Spontanous Breathing and Patient connected to face mask oxygen  Post-op Assessment: Report given to RN, Post -op Vital signs reviewed and stable and Patient moving all extremities  Post vital signs: Reviewed and stable  Last Vitals:  Filed Vitals:   06/18/15 0754 06/18/15 0800  BP: 136/80 117/75  Pulse: 84   Temp: 36.8 C   Resp: 18 20    Complications: No apparent anesthesia complications

## 2015-06-18 NOTE — Interval H&P Note (Signed)
History and Physical Interval Note:  06/18/2015 8:28 AM  [name]  has presented today for surgery, with the diagnosis of umbilical hernia  The various methods of treatment have been discussed with the patient and family. After consideration of risks, benefits and other options for treatment, the patient has consented to  Procedure(s): HERNIA REPAIR UMBILICAL ADULT WITH MESH (N/A) as a surgical intervention .  The patient's history has been reviewed, patient examined, no change in status, stable for surgery.  I have reviewed the patient's chart and labs.  Questions were answered to the patient's satisfaction.     Aviva Signs A

## 2015-06-18 NOTE — Anesthesia Procedure Notes (Signed)
Procedure Name: LMA Insertion Date/Time: 06/18/2015 8:48 AM Performed by: Charmaine Downs Pre-anesthesia Checklist: Patient identified, Emergency Drugs available, Suction available and Patient being monitored Patient Re-evaluated:Patient Re-evaluated prior to inductionOxygen Delivery Method: Circle system utilized Preoxygenation: Pre-oxygenation with 100% oxygen Intubation Type: IV induction Ventilation: Mask ventilation without difficulty LMA: LMA inserted LMA Size: 5.0 Grade View: Grade II Number of attempts: 1 Placement Confirmation: breath sounds checked- equal and bilateral and positive ETCO2 Tube secured with: Tape Dental Injury: Teeth and Oropharynx as per pre-operative assessment

## 2015-06-18 NOTE — Anesthesia Postprocedure Evaluation (Signed)
Anesthesia Post Note  Patient: Hospital doctor  Procedure(s) Performed: Procedure(s) (LRB): HERNIA REPAIR UMBILICAL ADULT WITH MESH (N/A) INSERTION OF MESH (N/A)  Patient location during evaluation: PACU Anesthesia Type: General Level of consciousness: awake, oriented and patient cooperative Pain management: pain level controlled Vital Signs Assessment: post-procedure vital signs reviewed and stable Respiratory status: spontaneous breathing, nonlabored ventilation and respiratory function stable Cardiovascular status: blood pressure returned to baseline Postop Assessment: no signs of nausea or vomiting Anesthetic complications: no    Last Vitals:  Filed Vitals:   06/18/15 0754 06/18/15 0800  BP: 136/80 117/75  Pulse: 84   Temp: 36.8 C   Resp: 18 20    Last Pain: There were no vitals filed for this visit.               Eleisha Branscomb J

## 2015-06-19 ENCOUNTER — Encounter (HOSPITAL_COMMUNITY): Payer: Self-pay | Admitting: General Surgery

## 2015-06-23 ENCOUNTER — Encounter (HOSPITAL_COMMUNITY): Payer: Self-pay | Admitting: General Surgery

## 2015-06-24 ENCOUNTER — Ambulatory Visit (INDEPENDENT_AMBULATORY_CARE_PROVIDER_SITE_OTHER): Payer: 59 | Admitting: Diagnostic Neuroimaging

## 2015-06-24 ENCOUNTER — Encounter: Payer: Self-pay | Admitting: Diagnostic Neuroimaging

## 2015-06-24 ENCOUNTER — Other Ambulatory Visit: Payer: Self-pay | Admitting: Diagnostic Neuroimaging

## 2015-06-24 VITALS — BP 102/59 | HR 112 | Ht 67.0 in | Wt 179.4 lb

## 2015-06-24 DIAGNOSIS — R269 Unspecified abnormalities of gait and mobility: Secondary | ICD-10-CM | POA: Diagnosis not present

## 2015-06-24 DIAGNOSIS — E512 Wernicke's encephalopathy: Secondary | ICD-10-CM

## 2015-06-24 DIAGNOSIS — R413 Other amnesia: Secondary | ICD-10-CM

## 2015-06-24 DIAGNOSIS — F1020 Alcohol dependence, uncomplicated: Secondary | ICD-10-CM | POA: Diagnosis not present

## 2015-06-24 NOTE — Patient Instructions (Signed)
Thank you for coming to see [place] at [facility]. I hope we have been able to provide you high quality care today.  You may receive a patient satisfaction survey over the next few weeks. We would appreciate your feedback and comments so that we may continue to improve ourselves and the health of our patients.  - I will check MRI and lab testing   ~~~~~~~~~~~~~~~~~~~~~~~~~~~~~~~~~~~~~~~~~~~~~~~~~~~~~~~~~~~~~~~~~  DR. PENUMALLI'S GUIDE TO HAPPY AND HEALTHY LIVING These are some of my general health and wellness recommendations. Some of them may apply to you better than others. Please use common sense as you try these suggestions and feel free to ask me any questions.   ACTIVITY/FITNESS Mental, social, emotional and physical stimulation are very important for brain and body health. Try learning a new activity (arts, music, language, sports, games).  Keep moving your body to the best of your abilities. You can do this at home, inside or outside, the park, community center, gym or anywhere you like. Consider a physical therapist or personal trainer to get started. Consider the app Sworkit. Fitness trackers such as smart-watches, smart-phones or Fitbits can help as well.   NUTRITION Eat more plants: colorful vegetables, nuts, seeds and berries.  Eat less sugar, salt, preservatives and processed foods.  Avoid toxins such as cigarettes and alcohol.  Drink water when you are thirsty. Warm water with a slice of lemon is an excellent morning drink to start the day.  Consider these websites for more information The Nutrition Source (https://www.henry-hernandez.biz/) Precision Nutrition (WindowBlog.ch)   RELAXATION Consider practicing mindfulness meditation or other relaxation techniques such as deep breathing, prayer, yoga, tai chi, massage. See website mindful.org or the apps Headspace or Calm to help get started.   SLEEP Try to  get at least 7-8+ hours sleep per day. Regular exercise and reduced caffeine will help you sleep better. Practice good sleep hygeine techniques. See website sleep.org for more information.   PLANNING Prepare estate planning, living will, healthcare POA documents. Sometimes this is best planned with the help of an attorney. Theconversationproject.org and agingwithdignity.org are excellent resources.

## 2015-06-24 NOTE — Progress Notes (Signed)
[facility]  PATIENT: [name] DOB: Oct 28, 1955  REFERRING CLINICIAN: S Ghimire HISTORY FROM: patient and wife  REASON FOR VISIT: follow up    HISTORICAL  CHIEF COMPLAINT:  Chief Complaint  Patient presents with  . Wernicke's encephalopathy    rm 5, wife- Harriett, " getting worse again-forgetting things, near-falls, slurred speech  . Follow-up    3 month    HISTORY OF PRESENT ILLNESS:   UPDATE 06/24/15: Since last visit, was doing about the same, until March 2017, then gradual decline of speech, balance, memory and confusion. More withdrawn. Less activity. Less appetite and eating. Has lost 20lbs. Possibly more depression.   PRIOR HPI (04/15/15): 60 year old male with long history of alcohol abuse, here for evaluation of wernicke's encephalopathy. Patient was recently admitted to hospital from 03/27/15 until 04/03/15 for confusion, double vision and gait difficulty. He was diagnosed with suspected Wernicke's encephalopathy and treated with IV thiamine. Neurology and psychiatry to teams were consulted. Initially patient was planned to be discharged to skilled nursing facility unfortunately due to financial services patient was discharged home with home health services. Since discharge home patient is doing better not back to baseline. Patient has greater than 10 year history of severe alcohol abuse, drinking greater than 12 beers per day. His health has been complicated by pancreatitis, anemia, thrombocytopenia, liver cirrhosis, depression and anxiety. Apparently patient stopped taking alcohol in early November 2016. In spite of this he has had several hospitalizations for confusion and metabolic disturbances.   REVIEW OF SYSTEMS: Full 14 system review of systems performed and negative except for: memory loss confusion weakness slurred speech difficulty swallowing insomnia depression anxiety pain incontinence weight loss blurred vision eye pain. Agitation anxiety  depression hallucinations. Excess sweating.   ALLERGIES: Allergies  Allergen Reactions  . Aspirin Other (See Comments)    Has upset stomach in high doses only. Took 4 aspirin with EMS, no reaction.    HOME MEDICATIONS: Outpatient Prescriptions Prior to Visit  Medication Sig Dispense Refill  . Artificial Tear Ointment (DRY EYES OP) Apply 2 drops to eye daily as needed (dry eyes).    . diphenhydrAMINE (BENADRYL) 25 MG tablet Take 50 mg by mouth every 6 (six) hours as needed for allergies.    . folic acid (FOLVITE) A999333 MCG tablet Take 400 mcg by mouth daily.    Marland Kitchen lactulose (CHRONULAC) 10 GM/15ML solution Take 45 mLs (30 g total) by mouth 3 (three) times daily. (Patient taking differently: Take 20 g by mouth 2 (two) times daily. ) 946 mL 0  . naproxen sodium (ANAPROX) 220 MG tablet Take 220 mg by mouth daily as needed (pain).    . QUEtiapine (SEROQUEL) 25 MG tablet Take 2-4 tablets by mouth 2 (two) times daily. Take 2 tablets twice daily and 3-4 tablets at bedtime.    Marland Kitchen spironolactone (ALDACTONE) 100 MG tablet Take 1 tablet (100 mg total) by mouth daily. (Patient taking differently: Take 50 mg by mouth daily. ) 30 tablet 0  . thiamine 100 MG tablet Take 1 tablet (100 mg total) by mouth daily. 30 tablet 0  . traZODone (DESYREL) 50 MG tablet Take 100 mg by mouth at bedtime.    Marland Kitchen oxyCODONE (ROXICODONE) 5 MG immediate release tablet Take 1 tablet (5 mg total) by mouth every 4 (four) hours as needed for severe pain. 50 tablet 0   No facility-administered medications prior to visit.    PAST MEDICAL HISTORY: Past Medical History  Diagnosis Date  . GERD (gastroesophageal  reflux disease)   . 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 Transfusion history 08-16-12    '99  . Pancreatitis 08-16-12    at present weakness,fatiques easily  . Foreign body 08-16-12    nose"BB" pellet since age 13  . History of ETOH abuse 08-16-12    Heavy alcohol use daily  . Anemia   . Anxiety   . Clotting disorder  ([place])   . Substance abuse   . Cirrhosis ([place])   . Wernicke encephalopathy   . Cirrhosis of liver ([place])     PAST SURGICAL HISTORY: Past Surgical History  Procedure Laterality Date  . Neck surgery      fusion of neck-titanium hardware inplaced  . Esophagogastroduodenoscopy    . Colonoscopy    . Eus N/A 08/18/2012    Procedure: UPPER ENDOSCOPIC ULTRASOUND (EUS) LINEAR;  Surgeon: Beryle Beams, MD;  Location: [facility];  Service: Endoscopy;  Laterality: N/A;  . Neck surgery  [id number]  . Umbilical hernia repair N/A 06/18/2015    Procedure: HERNIA REPAIR UMBILICAL ADULT WITH MESH;  Surgeon: Aviva Signs, MD;  Location: [facility];  Service: General;  Laterality: N/A;  . Insertion of mesh N/A 06/18/2015    Procedure: INSERTION OF MESH;  Surgeon: Aviva Signs, MD;  Location: [facility];  Service: General;  Laterality: N/A;    FAMILY HISTORY: Family History  Problem Relation Age of Onset  . Hypertension Father   . Cancer Mother 16    lung  . Stroke Mother     SOCIAL HISTORY:  Social History   Social History  . Marital Status: Married    Spouse Name: Harriett  . Number of Children: 2  . Years of Education: 9   Occupational History  .      disabled   Social History Main Topics  . Smoking status: Former Smoker -- 2.00 packs/day for 40 years    Types: Cigarettes    Quit date: 08/16/2005  . Smokeless tobacco: Never Used  . Alcohol Use: No     Comment: 12 cans beer per day, quit 01/2015  . Drug Use: No     Comment: 03/2015 quit 20 yrs ago  . Sexual Activity: Yes   Other Topics Concern  . Not on file   Social History Narrative   Lives at home with wife   Caffeine use- sodas 2 a day     PHYSICAL EXAM  GENERAL EXAM/CONSTITUTIONAL: Vitals:  Filed Vitals:   06/24/15 1331  BP: 102/59  Pulse: 112  Height: 5\' 7"  (1.702 m)  Weight: 179 lb 6.4 oz (81.375 kg)   Body mass index is 28.09 kg/(m^2). No exam data present  Patient is in no distress; well developed, nourished and  groomed; neck is supple  CARDIOVASCULAR:  Examination of carotid arteries is normal; no carotid bruits  Regular rate and rhythm, no murmurs  Examination of peripheral vascular system by observation and palpation is normal  EYES:  Ophthalmoscopic exam of optic discs and posterior segments is normal; no papilledema or hemorrhages  MUSCULOSKELETAL:  Gait, strength, tone, movements noted in Neurologic exam below  NEUROLOGIC: MENTAL STATUS:  No flowsheet data found.  awake, alert, oriented to person  [facility] memory   DECR attention and concentration  DECR FLUENCY  fund of knowledge appropriate  JOVIAL; MAKES JOKES  CRANIAL NERVE:   2nd, 3rd, 4th, 6th - pupils equal and reactive to light, visual fields full to confrontation, extraocular muscles intact, no nystagmus  5th -  facial sensation symmetric  7th - facial strength symmetric  8th - hearing intact  9th - palate elevates symmetrically, uvula midline  11th - shoulder shrug symmetric  12th - tongue protrusion midline  MOTOR:   normal bulk and tone, full strength in the BUE, BLE  SENSORY:   normal and symmetric to light touch, temperature, vibration  COORDINATION:   finger-nose-finger, fine finger movements SLOW; MILD DYSMETRIA  REFLEXES:   deep tendon reflexes TRACE and symmetric  GAIT/STATION:   narrow based gait; SLOW SLIGHTLY ATAXIC; USES SINGLE POINT CANE    DIAGNOSTIC DATA (LABS, IMAGING, TESTING) - I reviewed patient records, labs, notes, testing and imaging myself where available.  Lab Results  Component Value Date   WBC 2.6* 06/13/2015   HGB 9.8* 06/13/2015   HCT 29.1* 06/13/2015   MCV 96.4 06/13/2015   PLT 76* 06/13/2015      Component Value Date/Time   NA 137 06/13/2015 1000   K 4.4 06/13/2015 1000   CL 107 06/13/2015 1000   CO2 24 06/13/2015 1000   GLUCOSE 111* 06/13/2015 1000   BUN 24* 06/13/2015 1000   CREATININE 0.85 06/13/2015 1000   CALCIUM 9.2 06/13/2015 1000    PROT 7.1 06/13/2015 1000   ALBUMIN 3.5 06/13/2015 1000   AST 25 06/13/2015 1000   ALT 11* 06/13/2015 1000   ALKPHOS 88 06/13/2015 1000   BILITOT 0.6 06/13/2015 1000   GFRNONAA >60 06/13/2015 1000   GFRAA >60 06/13/2015 1000   Lab Results  Component Value Date   CHOL  06/15/2007    149        ATP III CLASSIFICATION:  <200     mg/dL   Desirable  200-239  mg/dL   Borderline High  >=240    mg/dL   High   HDL 17* 06/15/2007   LDLCALC  06/15/2007    98        Total Cholesterol/HDL:CHD Risk Coronary Heart Disease Risk Table                     Men   Women  1/2 Average Risk   3.4   3.3   TRIG 170* 06/15/2007   CHOLHDL 8.8 06/15/2007   No results found for: HGBA1C Lab Results  Component Value Date   [id number] 03/28/2015   Lab Results  Component Value Date   TSH 3.277 03/28/2015    03/20/15 MRI brain [I reviewed images myself and agree with interpretation. -VRP]  1. Incomplete examination with motion and magnetic susceptibility artifact as above. 2. No acute intracranial abnormality identified. 3. Mildly advanced cerebral atrophy for age.  03/29/15 EEG  - Generalized non-specific cerebral dysfunction(encephalopathy). There was no seizure or seizure predisposition recorded on this study.     ASSESSMENT AND PLAN  60 y.o. year old male here with long history of alcohol abuse, with recurrent confusional episodes in setting of metabolic disturbance and suspected Wernicke's encephalopathy. Symptoms slightly improved in Jan / Feb 2017, but now gradually worsening since March 2017. Could be metabolic, toxic, vascular, deconditioning or depression.   Dx:  Wernicke's encephalopathy - Plan: Vitamin B1, Vitamin B12, TSH, Ammonia, CBC with Differential/Platelet, Comprehensive metabolic panel, MR Brain W Wo Contrast  Alcohol use disorder, severe, dependence (HCC) - Plan: Vitamin B1, Vitamin B12, TSH, Ammonia, CBC with Differential/Platelet, Comprehensive metabolic panel, MR Brain W  Wo Contrast  Gait difficulty - Plan: Vitamin B1, Vitamin B12, TSH, Ammonia, CBC with Differential/Platelet, Comprehensive metabolic panel, MR Brain W Wo  Contrast  Memory loss - Plan: Vitamin B1, Vitamin B12, TSH, Ammonia, CBC with Differential/Platelet, Comprehensive metabolic panel, MR Brain W Wo Contrast    PLAN: - check additional testing - reviewed importance of nutrition, physical activity, stress mgmt and sleep  Orders Placed This Encounter  Procedures  . MR Brain W Wo Contrast  . Vitamin B1  . Vitamin B12  . TSH  . Ammonia  . CBC with Differential/Platelet  . Comprehensive metabolic panel   Return in about 6 weeks (around 08/05/2015).    Penni Bombard, MD [id number], [id number] PM Certified in Neurology, Neurophysiology and Neuroimaging  [facility] [address] [place],  [address] [phone]

## 2015-06-27 ENCOUNTER — Telehealth: Payer: Self-pay | Admitting: *Deleted

## 2015-06-27 LAB — AMMONIA: Ammonia: 105 ug/dL — ABNORMAL HIGH (ref 27–102)

## 2015-06-27 LAB — COMPREHENSIVE METABOLIC PANEL
A/G RATIO: 1 — AB (ref 1.2–2.2)
ALK PHOS: 96 IU/L (ref 39–117)
ALT: 8 IU/L (ref 0–44)
AST: 28 IU/L (ref 0–40)
Albumin: 4.3 g/dL (ref 3.6–4.8)
BILIRUBIN TOTAL: 1 mg/dL (ref 0.0–1.2)
BUN/Creatinine Ratio: 28 — ABNORMAL HIGH (ref 10–24)
BUN: 35 mg/dL — ABNORMAL HIGH (ref 8–27)
CO2: 22 mmol/L (ref 18–29)
Calcium: 10.1 mg/dL (ref 8.6–10.2)
Chloride: 96 mmol/L (ref 96–106)
Creatinine, Ser: 1.24 mg/dL (ref 0.76–1.27)
GFR calc Af Amer: 73 mL/min/{1.73_m2} (ref >59)
GFR calc non Af Amer: 63 mL/min/{1.73_m2} (ref >59)
GLOBULIN, TOTAL: 4.4 g/dL (ref 1.5–4.5)
Glucose: 83 mg/dL (ref 65–99)
POTASSIUM: 4.2 mmol/L (ref 3.5–5.2)
SODIUM: 136 mmol/L (ref 134–144)
Total Protein: 8.7 g/dL — ABNORMAL HIGH (ref 6.0–8.5)

## 2015-06-27 LAB — CBC WITH DIFFERENTIAL/PLATELET
Basophils Absolute: 0 10*3/uL (ref 0.0–0.2)
Basos: 1 %
EOS (ABSOLUTE): 0.1 10*3/uL (ref 0.0–0.4)
EOS: 3 %
HEMATOCRIT: 34.9 % — AB (ref 37.5–51.0)
Hemoglobin: 11.8 g/dL — ABNORMAL LOW (ref 12.6–17.7)
Immature Grans (Abs): 0 10*3/uL (ref 0.0–0.1)
Immature Granulocytes: 0 %
Lymphocytes Absolute: 1.3 10*3/uL (ref 0.7–3.1)
Lymphs: 31 %
MCH: 31.6 pg (ref 26.6–33.0)
MCHC: 33.8 g/dL (ref 31.5–35.7)
MCV: 94 fL (ref 79–97)
MONOS ABS: 0.5 10*3/uL (ref 0.1–0.9)
Monocytes: 13 %
Neutrophils Absolute: 2.2 10*3/uL (ref 1.4–7.0)
Neutrophils: 52 %
Platelets: 106 10*3/uL — ABNORMAL LOW (ref 150–379)
RBC: 3.73 x10E6/uL — AB (ref 4.14–5.80)
RDW: 14.8 % (ref 12.3–15.4)
WBC: 4.2 10*3/uL (ref 3.4–10.8)

## 2015-06-27 LAB — VITAMIN B1: THIAMINE: 188 nmol/L (ref 66.5–200.0)

## 2015-06-27 LAB — VITAMIN B12: Vitamin B-12: 763 pg/mL (ref 211–946)

## 2015-06-27 LAB — TSH: TSH: 2.02 u[IU]/mL (ref 0.450–4.500)

## 2015-06-27 NOTE — Telephone Encounter (Signed)
LVM requesting patient call back Monday for results.

## 2015-06-30 NOTE — Telephone Encounter (Signed)
LVM on home phone. LVM on mobile # informing pt, per Dr Leta Baptist, lab results showed Ammonia is elevated. This can cause more confusion. Needs to continue lactulose and follow up with PCP or GI. Left name and number for any questions.

## 2015-06-30 NOTE — Telephone Encounter (Signed)
Spoke with wife and informed her that per Dr Leta Baptist, lab results showed Ammonia is elevated. This can cause more confusion. Needs to continue lactulose and follow up with PCP or GI. She stated he has appointment with PCP this Wed. She stated that pt was having constipation; she gave him Miralax with relief. She verbalized understanding of conversation.

## 2015-06-30 NOTE — Telephone Encounter (Signed)
Pt's wife called to get lab results. Please call [phone]

## 2015-07-02 DIAGNOSIS — K769 Liver disease, unspecified: Secondary | ICD-10-CM | POA: Insufficient documentation

## 2015-07-29 ENCOUNTER — Other Ambulatory Visit: Payer: Self-pay | Admitting: Orthopedic Surgery

## 2015-07-29 DIAGNOSIS — M542 Cervicalgia: Secondary | ICD-10-CM

## 2015-08-05 ENCOUNTER — Encounter: Payer: Self-pay | Admitting: Diagnostic Neuroimaging

## 2015-08-05 ENCOUNTER — Telehealth: Payer: Self-pay | Admitting: Diagnostic Neuroimaging

## 2015-08-05 ENCOUNTER — Ambulatory Visit (INDEPENDENT_AMBULATORY_CARE_PROVIDER_SITE_OTHER): Payer: 59 | Admitting: Diagnostic Neuroimaging

## 2015-08-05 VITALS — BP 90/64 | HR 93 | Ht 67.0 in | Wt 191.6 lb

## 2015-08-05 DIAGNOSIS — R6882 Decreased libido: Secondary | ICD-10-CM | POA: Diagnosis not present

## 2015-08-05 DIAGNOSIS — N62 Hypertrophy of breast: Secondary | ICD-10-CM

## 2015-08-05 DIAGNOSIS — E512 Wernicke's encephalopathy: Secondary | ICD-10-CM | POA: Diagnosis not present

## 2015-08-05 NOTE — Telephone Encounter (Signed)
Wife called to advise, Seroquel is 50 mg.

## 2015-08-05 NOTE — Progress Notes (Signed)
[facility]  PATIENT: [name] DOB: 1955-08-13  REFERRING CLINICIAN: S Ghimire HISTORY FROM: patient and wife  REASON FOR VISIT: follow up    HISTORICAL  CHIEF COMPLAINT:  Chief Complaint  Patient presents with  . Wernicke's encephalopathy    rm 6, wife-Harriett, "difficulty completing conversations, increased speech difficulty-running words together"  . Follow-up    6 weeks    HISTORY OF PRESENT ILLNESS:   UPDATE 08/05/15: Since last visit, still with decline in memory. Now with sore breasts, enlarged breast tissue, low libido. Tolerating meds. Has followed up with PCP. GI eval pending. Mood improved.   UPDATE 06/24/15: Since last visit, was doing about the same, until March 2017, then gradual decline of speech, balance, memory and confusion. More withdrawn. Less activity. Less appetite and eating. Has lost 20lbs. Possibly more depression.   PRIOR HPI (04/15/15): 60 year old male with long history of alcohol abuse, here for evaluation of wernicke's encephalopathy. Patient was recently admitted to hospital from 03/27/15 until 04/03/15 for confusion, double vision and gait difficulty. He was diagnosed with suspected Wernicke's encephalopathy and treated with IV thiamine. Neurology and psychiatry to teams were consulted. Initially patient was planned to be discharged to skilled nursing facility unfortunately due to financial services patient was discharged home with home health services. Since discharge home patient is doing better not back to baseline. Patient has greater than 10 year history of severe alcohol abuse, drinking greater than 12 beers per day. His health has been complicated by pancreatitis, anemia, thrombocytopenia, liver cirrhosis, depression and anxiety. Apparently patient stopped taking alcohol in early November 2016. In spite of this he has had several hospitalizations for confusion and metabolic disturbances.   REVIEW OF SYSTEMS: Full 14 system  review of systems performed and negative except for: memory loss confusion weakness slurred speech difficulty swallowing insomnia depression anxiety pain incontinence weight loss blurred vision eye pain. Agitation anxiety depression. Excess sweating.   ALLERGIES: Allergies  Allergen Reactions  . Aspirin Other (See Comments)    Has upset stomach in high doses only. Took 4 aspirin with EMS, no reaction.    HOME MEDICATIONS: Outpatient Prescriptions Prior to Visit  Medication Sig Dispense Refill  . Artificial Tear Ointment (DRY EYES OP) Apply 2 drops to eye daily as needed (dry eyes).    . diphenhydrAMINE (BENADRYL) 25 MG tablet Take 50 mg by mouth every 6 (six) hours as needed for allergies.    . folic acid (FOLVITE) A999333 MCG tablet Take 400 mcg by mouth daily.    Marland Kitchen lactulose (CHRONULAC) 10 GM/15ML solution Take 45 mLs (30 g total) by mouth 3 (three) times daily. (Patient taking differently: Take 20 g by mouth 2 (two) times daily. ) 946 mL 0  . naproxen sodium (ANAPROX) 220 MG tablet Take 220 mg by mouth daily as needed (pain).    . QUEtiapine (SEROQUEL) 25 MG tablet Take 2-4 tablets by mouth 2 (two) times daily. Take 2 tablets twice daily and 3-4 tablets at bedtime.    Marland Kitchen spironolactone (ALDACTONE) 100 MG tablet Take 1 tablet (100 mg total) by mouth daily. (Patient taking differently: Take 50 mg by mouth daily. ) 30 tablet 0  . thiamine 100 MG tablet Take 1 tablet (100 mg total) by mouth daily. 30 tablet 0  . traZODone (DESYREL) 50 MG tablet Take 100 mg by mouth at bedtime.     No facility-administered medications prior to visit.    PAST MEDICAL HISTORY: Past Medical History  Diagnosis Date  .  GERD (gastroesophageal reflux disease)   . 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 Transfusion history 08-16-12    '99  . Pancreatitis 08-16-12    at present weakness,fatiques easily  . Foreign body 08-16-12    nose"BB" pellet since age 71  . History of ETOH abuse 08-16-12    Heavy alcohol use  daily  . Anemia   . Anxiety   . Clotting disorder ([place])   . Substance abuse   . Cirrhosis ([place])   . Wernicke encephalopathy   . Cirrhosis of liver ([place])     PAST SURGICAL HISTORY: Past Surgical History  Procedure Laterality Date  . Neck surgery      fusion of neck-titanium hardware inplaced  . Esophagogastroduodenoscopy    . Colonoscopy    . Eus N/A 08/18/2012    Procedure: UPPER ENDOSCOPIC ULTRASOUND (EUS) LINEAR;  Surgeon: Beryle Beams, MD;  Location: [facility];  Service: Endoscopy;  Laterality: N/A;  . Neck surgery  [id number]  . Umbilical hernia repair N/A 06/18/2015    Procedure: HERNIA REPAIR UMBILICAL ADULT WITH MESH;  Surgeon: Aviva Signs, MD;  Location: [facility];  Service: General;  Laterality: N/A;  . Insertion of mesh N/A 06/18/2015    Procedure: INSERTION OF MESH;  Surgeon: Aviva Signs, MD;  Location: [facility];  Service: General;  Laterality: N/A;    FAMILY HISTORY: Family History  Problem Relation Age of Onset  . Hypertension Father   . Cancer Mother 76    lung  . Stroke Mother     SOCIAL HISTORY:  Social History   Social History  . Marital Status: Married    Spouse Name: Harriett  . Number of Children: 2  . Years of Education: 9   Occupational History  .      disabled   Social History Main Topics  . Smoking status: Former Smoker -- 2.00 packs/day for 40 years    Types: Cigarettes    Quit date: 08/16/2005  . Smokeless tobacco: Never Used  . Alcohol Use: No     Comment: 12 cans beer per day, quit 01/2015  . Drug Use: No     Comment: 03/2015 quit 20 yrs ago  . Sexual Activity: Yes   Other Topics Concern  . Not on file   Social History Narrative   Lives at home with wife   Caffeine use- sodas 2 a day     PHYSICAL EXAM  GENERAL EXAM/CONSTITUTIONAL: Vitals:  Filed Vitals:   08/05/15 1455  BP: 90/64  Pulse: 93  Height: 5\' 7"  (1.702 m)  Weight: 191 lb 9.6 oz (86.909 kg)   Body mass index is 30 kg/(m^2). No exam data present  Patient  is in no distress; well developed, nourished and groomed; neck is supple  CARDIOVASCULAR:  Examination of carotid arteries is normal; no carotid bruits  Regular rate and rhythm, no murmurs  Examination of peripheral vascular system by observation and palpation is normal  EYES:  Ophthalmoscopic exam of optic discs and posterior segments is normal; no papilledema or hemorrhages  MUSCULOSKELETAL:  Gait, strength, tone, movements noted in Neurologic exam below  NEUROLOGIC: MENTAL STATUS:  No flowsheet data found.  awake, alert, oriented to person  [facility] memory   DECR attention and concentration  DECR FLUENCY  fund of knowledge appropriate  JOVIAL; MAKES JOKES  CRANIAL NERVE:   2nd, 3rd, 4th, 6th - pupils equal and reactive to light, visual fields full to confrontation, extraocular muscles intact, no nystagmus  5th - facial sensation symmetric  7th - facial strength symmetric  8th - hearing intact  9th - palate elevates symmetrically, uvula midline  11th - shoulder shrug symmetric  12th - tongue protrusion midline  MOTOR:   normal bulk and tone, full strength in the BUE, BLE  SENSORY:   normal and symmetric to light touch, temperature, vibration  COORDINATION:   finger-nose-finger, fine finger movements SLOW; MILD DYSMETRIA  NO ASTERIXIS  REFLEXES:   deep tendon reflexes TRACE and symmetric  GAIT/STATION:   narrow based gait; SLOW SLIGHTLY ATAXIC    DIAGNOSTIC DATA (LABS, IMAGING, TESTING) - I reviewed patient records, labs, notes, testing and imaging myself where available.  Lab Results  Component Value Date   WBC 4.2 06/24/2015   HGB 9.8* 06/13/2015   HCT 34.9* 06/24/2015   MCV 94 06/24/2015   PLT 106* 06/24/2015      Component Value Date/Time   NA 136 06/24/2015 1432   NA 137 06/13/2015 1000   K 4.2 06/24/2015 1432   CL 96 06/24/2015 1432   CO2 22 06/24/2015 1432   GLUCOSE 83 06/24/2015 1432   GLUCOSE 111* 06/13/2015 1000    BUN 35* 06/24/2015 1432   BUN 24* 06/13/2015 1000   CREATININE 1.24 06/24/2015 1432   CALCIUM 10.1 06/24/2015 1432   PROT 8.7* 06/24/2015 1432   PROT 7.1 06/13/2015 1000   ALBUMIN 4.3 06/24/2015 1432   ALBUMIN 3.5 06/13/2015 1000   AST 28 06/24/2015 1432   ALT 8 06/24/2015 1432   ALKPHOS 96 06/24/2015 1432   BILITOT 1.0 06/24/2015 1432   BILITOT 0.6 06/13/2015 1000   GFRNONAA 63 06/24/2015 1432   GFRAA 73 06/24/2015 1432   Lab Results  Component Value Date   CHOL  06/15/2007    149        ATP III CLASSIFICATION:  <200     mg/dL   Desirable  200-239  mg/dL   Borderline High  >=240    mg/dL   High   HDL 17* 06/15/2007   LDLCALC  06/15/2007    98        Total Cholesterol/HDL:CHD Risk Coronary Heart Disease Risk Table                     Men   Women  1/2 Average Risk   3.4   3.3   TRIG 170* 06/15/2007   CHOLHDL 8.8 06/15/2007   No results found for: HGBA1C Lab Results  Component Value Date   VITAMINB12 763 06/24/2015   Lab Results  Component Value Date   TSH 2.020 06/24/2015    03/20/15 MRI brain [I reviewed images myself and agree with interpretation. -VRP]  1. Incomplete examination with motion and magnetic susceptibility artifact as above. 2. No acute intracranial abnormality identified. 3. Mildly advanced cerebral atrophy for age.  03/29/15 EEG  - Generalized non-specific cerebral dysfunction(encephalopathy). There was no seizure or seizure predisposition recorded on this study.     ASSESSMENT AND PLAN  60 y.o. year old male here with long history of alcohol abuse, with recurrent confusional episodes in setting of metabolic disturbance and suspected Wernicke's encephalopathy. Symptoms slightly improved in Jan / Feb 2017, but now gradually worsening since March 2017.    Dx:  Wernicke's encephalopathy - Plan: Ambulatory referral to Endocrinology  Gynecomastia - Plan: Ambulatory referral to Endocrinology  Decreased libido - Plan: Ambulatory referral to  Endocrinology    PLAN: - reviewed importance of nutrition, physical activity, stress mgmt  and sleep - refer to endocrinology for possible low testosterone evaluation (gynecomastia, low libido)  Orders Placed This Encounter  Procedures  . Ambulatory referral to Endocrinology   Return in about 6 months (around 02/05/2016).    Penni Bombard, MD [id number], [id number] PM Certified in Neurology, Neurophysiology and Neuroimaging  [facility] [address], [place], [place] [address] [phone]

## 2015-08-06 NOTE — Telephone Encounter (Signed)
Noted in MAR.

## 2015-08-07 ENCOUNTER — Ambulatory Visit
Admission: RE | Admit: 2015-08-07 | Discharge: 2015-08-07 | Disposition: A | Discharge status: Home / Self Care | Payer: 59 | Source: Ambulatory Visit | Attending: Orthopedic Surgery | Admitting: Orthopedic Surgery

## 2015-08-07 ENCOUNTER — Other Ambulatory Visit: Payer: Self-pay

## 2015-08-07 DIAGNOSIS — M542 Cervicalgia: Secondary | ICD-10-CM

## 2015-09-11 DIAGNOSIS — M4312 Spondylolisthesis, cervical region: Secondary | ICD-10-CM | POA: Insufficient documentation

## 2015-09-11 DIAGNOSIS — M961 Postlaminectomy syndrome, not elsewhere classified: Secondary | ICD-10-CM | POA: Insufficient documentation

## 2015-09-30 ENCOUNTER — Encounter: Payer: Self-pay | Admitting: Endocrinology

## 2015-09-30 ENCOUNTER — Ambulatory Visit (INDEPENDENT_AMBULATORY_CARE_PROVIDER_SITE_OTHER): Payer: 59 | Admitting: Endocrinology

## 2015-09-30 VITALS — BP 116/73 | HR 81 | Ht 67.0 in | Wt 176.0 lb

## 2015-09-30 DIAGNOSIS — N62 Hypertrophy of breast: Secondary | ICD-10-CM | POA: Diagnosis not present

## 2015-09-30 DIAGNOSIS — Z125 Encounter for screening for malignant neoplasm of prostate: Secondary | ICD-10-CM | POA: Diagnosis not present

## 2015-09-30 LAB — HCG, QUANTITATIVE, PREGNANCY: Quantitative HCG: 0.39 m[IU]/mL

## 2015-09-30 LAB — PSA: PSA: 0.1 ng/mL (ref 0.10–4.00)

## 2015-09-30 NOTE — Patient Instructions (Signed)
blood tests are requested for you today.  We'll let you know about the results.  Based on the results, i hope to be able to prescribe for a pill to reduce the breast swelling and pain.   Please come back for a follow-up appointment in 3 months.

## 2015-09-30 NOTE — Progress Notes (Signed)
Subjective:    Patient ID: [name], male    DOB: 03-Apr-1955, 60 y.o.   MRN: [id number]  HPI Pt was the product of a normal pregnancy and delivery.  he had puberty at the normal age.  He has 2 biological children. He has never been diagnosed with hypogonadism.  He denies any h/o infertility.  He has never had kidney disease, cancer, cystic fibrosis, ulcerative colitis, or BPH. Her has never taken cimetidine, calcium channel blockers, growth hormone, risperidone, hCG, opioids, androgens, 5-alpha-reductase inhibitors, cancer chemotherapy, estrogens, or ketoconazole.  He now reports 3 months of moderate swelling at both breasts, and slight assoc pain.   He has chronic alcoholism.  He has been on aldactone x 1 year, for portal HTN.   Past Medical History  Diagnosis Date  . GERD (gastroesophageal reflux disease)   . 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 Transfusion history 08-16-12    '99  . Pancreatitis 08-16-12    at present weakness,fatiques easily  . Foreign body 08-16-12    nose"BB" pellet since age 1  . History of ETOH abuse 08-16-12    Heavy alcohol use daily  . Anemia   . Anxiety   . Clotting disorder ([place])   . Substance abuse   . Cirrhosis ([place])   . Wernicke encephalopathy   . Cirrhosis of liver [facility])     Past Surgical History  Procedure Laterality Date  . Neck surgery      fusion of neck-titanium hardware inplaced  . Esophagogastroduodenoscopy    . Colonoscopy    . Eus N/A 08/18/2012    Procedure: UPPER ENDOSCOPIC ULTRASOUND (EUS) LINEAR;  Surgeon: Beryle Beams, MD;  Location: [facility];  Service: Endoscopy;  Laterality: N/A;  . Neck surgery  [id number]  . Umbilical hernia repair N/A 06/18/2015    Procedure: HERNIA REPAIR UMBILICAL ADULT WITH MESH;  Surgeon: Aviva Signs, MD;  Location: [facility];  Service: General;  Laterality: N/A;  . Insertion of mesh N/A 06/18/2015    Procedure: INSERTION OF MESH;  Surgeon: Aviva Signs, MD;  Location: [facility];  Service: General;   Laterality: N/A;    Social History   Social History  . Marital Status: Married    Spouse Name: Harriett  . Number of Children: 2  . Years of Education: 9   Occupational History  .      disabled   Social History Main Topics  . Smoking status: Former Smoker -- 2.00 packs/day for 40 years    Types: Cigarettes    Quit date: 08/16/2005  . Smokeless tobacco: Never Used  . Alcohol Use: No     Comment: 12 cans beer per day, quit 01/2015  . Drug Use: No     Comment: 03/2015 quit 20 yrs ago  . Sexual Activity: Yes   Other Topics Concern  . Not on file   Social History Narrative   Lives at home with wife   Caffeine use- sodas 2 a day    Current Outpatient Prescriptions on File Prior to Visit  Medication Sig Dispense Refill  . Artificial Tear Ointment (DRY EYES OP) Apply 2 drops to eye daily as needed (dry eyes).    . diphenhydrAMINE (BENADRYL) 25 MG tablet Take 50 mg by mouth every 6 (six) hours as needed for allergies.    . folic acid (FOLVITE) A999333 MCG tablet Take 400 mcg by mouth daily.    Marland Kitchen lactulose (CHRONULAC) 10 GM/15ML solution Take 45 mLs (30 g  total) by mouth 3 (three) times daily. (Patient taking differently: Take 20 g by mouth 2 (two) times daily. 6 tablespoons daily) 946 mL 0  . naproxen sodium (ANAPROX) 220 MG tablet Take 220 mg by mouth daily as needed (pain).    . QUEtiapine (SEROQUEL) 25 MG tablet Take 2-4 tablets by mouth 2 (two) times daily. Take 2 tablets twice daily and 3-4 tablets at bedtime.    Marland Kitchen spironolactone (ALDACTONE) 100 MG tablet Take 1 tablet (100 mg total) by mouth daily. (Patient taking differently: Take 50 mg by mouth daily. ) 30 tablet 0  . thiamine 100 MG tablet Take 1 tablet (100 mg total) by mouth daily. 30 tablet 0  . traZODone (DESYREL) 50 MG tablet Take 100 mg by mouth at bedtime.     No current facility-administered medications on file prior to visit.    Allergies  Allergen Reactions  . Aspirin Other (See Comments)    Has upset stomach  in high doses only. Took 4 aspirin with EMS, no reaction.    Family History  Problem Relation Age of Onset  . Hypertension Father   . Cancer Mother 72    lung  . Stroke Mother   . Other Neg Hx     gynecomastia    BP 116/73 mmHg  Pulse 81  Ht 5\' 7"  (1.702 m)  Wt 176 lb (79.833 kg)  BMI 27.56 kg/m2  Review of Systems denies depression, seizures, decreased urinary stream, fever, headache, blurry vision, rhinorrhea, chest pain.  He has chronic ED sxs.  He has recently lost 20 lbs.  He has slight diffuse muscle weakness, doe, itching, and easy bruising.      Objective:   Physical Exam VS: see vs page GEN: no distress HEAD: head: no deformity eyes: no periorbital swelling, no proptosis external nose and ears are normal mouth: no lesion seen NECK: supple, thyroid is not enlarged CHEST WALL: no deformity LUNGS: clear to auscultation BREASTS:  Moderate bilat gynecomastia CV: reg rate and rhythm, no murmur ABD: abdomen is soft, nontender.  Liver edge is palpable, but no palpable splenomegaly.  not distended.  no hernia.   GENITALIA:  Normal male.   MUSCULOSKELETAL: muscle bulk and strength are grossly normal.  no obvious joint swelling.  gait is normal and steady EXTEMITIES: no deformity.  no ulcer on the feet.  feet are of normal color and temp.  Trace bilat leg edema PULSES: dorsalis pedis intact bilat.  no carotid bruit NEURO:  cn 2-12 grossly intact.   readily moves all 4's.  sensation is intact to touch on the feet. SKIN:  Normal texture and temperature.  No rash or suspicious lesion is visible.   NODES:  None palpable at the neck PSYCH: alert, Ox2. says it is August of 2017.  Does not appear anxious nor depressed.    Lab Results  Component Value Date   TSH 2.020 06/24/2015      Assessment & Plan:  Gynecomastia, new.  He can probably be rx'ed with tamoxifen.   Liver disease: this can cause gynecomastia.   Portal HTN: aldactone can also cause gynecomastia  However, it  is working well, so he should continue.    Patient is advised the following: Patient Instructions  blood tests are requested for you today.  We'll let you know about the results.  Based on the results, i hope to be able to prescribe for a pill to reduce the breast swelling and pain.   Please come back for  a follow-up appointment in 3 months.    Renato Shin, MD

## 2015-10-01 ENCOUNTER — Other Ambulatory Visit: Payer: Self-pay | Admitting: Gastroenterology

## 2015-10-01 DIAGNOSIS — K869 Disease of pancreas, unspecified: Secondary | ICD-10-CM

## 2015-10-01 LAB — TESTOSTERONE,FREE AND TOTAL
Testosterone, Free: 2.4 pg/mL — ABNORMAL LOW (ref 6.6–18.1)
Testosterone: 301 ng/dL (ref 264–916)

## 2015-10-06 ENCOUNTER — Other Ambulatory Visit: Payer: Self-pay | Admitting: Endocrinology

## 2015-10-06 DIAGNOSIS — N62 Hypertrophy of breast: Secondary | ICD-10-CM

## 2015-10-06 LAB — ESTRADIOL, FREE
ESTRADIOL: 17 pg/mL (ref <=29)
Estradiol, Free: 0.27 pg/mL (ref <=0.45)

## 2015-10-06 MED ORDER — TAMOXIFEN CITRATE 10 MG PO TABS: 10.0000 mg | ORAL_TABLET | Freq: Every day | ORAL | Status: DC

## 2015-10-09 ENCOUNTER — Other Ambulatory Visit: Payer: Self-pay | Admitting: Gastroenterology

## 2015-10-09 ENCOUNTER — Ambulatory Visit
Admission: RE | Admit: 2015-10-09 | Discharge: 2015-10-09 | Disposition: A | Discharge status: Home / Self Care | Payer: 59 | Source: Ambulatory Visit | Attending: Gastroenterology | Admitting: Gastroenterology

## 2015-10-09 ENCOUNTER — Other Ambulatory Visit: Payer: Self-pay | Admitting: Endocrinology

## 2015-10-09 ENCOUNTER — Telehealth: Payer: Self-pay | Admitting: Endocrinology

## 2015-10-09 DIAGNOSIS — K869 Disease of pancreas, unspecified: Secondary | ICD-10-CM

## 2015-10-09 MED ORDER — TAMOXIFEN CITRATE 10 MG PO TABS: 10.0000 mg | ORAL_TABLET | Freq: Every day | ORAL | 11 refills | Status: DC

## 2015-10-09 NOTE — Telephone Encounter (Signed)
I don't know what happened, but I have resent

## 2015-10-09 NOTE — Telephone Encounter (Signed)
I DON'T SEE WHERE YOU SENT THAT MEDICATION.

## 2015-10-09 NOTE — Telephone Encounter (Signed)
Patient stated that the pharmacy haven't received the prescription. Don't know the name of it.

## 2015-10-10 NOTE — Telephone Encounter (Signed)
lmtrc

## 2015-10-13 ENCOUNTER — Ambulatory Visit
Admission: RE | Admit: 2015-10-13 | Discharge: 2015-10-13 | Disposition: A | Discharge status: Home / Self Care | Payer: 59 | Source: Ambulatory Visit | Attending: Gastroenterology | Admitting: Gastroenterology

## 2015-10-13 DIAGNOSIS — K869 Disease of pancreas, unspecified: Secondary | ICD-10-CM

## 2015-10-13 MED ORDER — GADOBENATE DIMEGLUMINE 529 MG/ML IV SOLN
16.0000 mL | Freq: Once | INTRAVENOUS | Status: AC | PRN
  Administered 2015-10-13: 16 mL via INTRAVENOUS

## 2015-11-11 ENCOUNTER — Other Ambulatory Visit: Payer: Self-pay | Admitting: Orthopedic Surgery

## 2015-11-11 DIAGNOSIS — R1310 Dysphagia, unspecified: Secondary | ICD-10-CM

## 2015-11-14 ENCOUNTER — Ambulatory Visit
Admission: RE | Admit: 2015-11-14 | Discharge: 2015-11-14 | Disposition: A | Discharge status: Home / Self Care | Payer: 59 | Source: Ambulatory Visit | Attending: Orthopedic Surgery | Admitting: Orthopedic Surgery

## 2015-11-14 DIAGNOSIS — R1310 Dysphagia, unspecified: Secondary | ICD-10-CM

## 2015-12-31 ENCOUNTER — Ambulatory Visit (INDEPENDENT_AMBULATORY_CARE_PROVIDER_SITE_OTHER): Payer: 59 | Admitting: Endocrinology

## 2015-12-31 ENCOUNTER — Encounter: Payer: Self-pay | Admitting: Endocrinology

## 2015-12-31 VITALS — BP 120/66 | HR 88 | Ht 67.0 in | Wt 194.0 lb

## 2015-12-31 DIAGNOSIS — N62 Hypertrophy of breast: Secondary | ICD-10-CM | POA: Diagnosis not present

## 2015-12-31 NOTE — Patient Instructions (Addendum)
Please continue the same tamoxifen medication.  Weight loss helps the breast pain, also.  Please come back for a follow-up appointment in 1 year

## 2015-12-31 NOTE — Progress Notes (Signed)
Subjective:    Patient ID: [name], male    DOB: 07-23-1955, 60 y.o.   MRN: [id number]  HPI Pt returns for f/u of gynecomastia (dx'ed 2016; he has 2 biological children; only secondary causes found were chronic alcoholic hepatitis and aldactone; he was rx'ed tamoxifen).  Since on the tamoxifen, mastalgia is much better.   Past Medical History:  Diagnosis Date  . Anemia   . Anxiety   . Arthritis 08-16-12   generalized arthritis  . Cirrhosis ([place])   . Cirrhosis of liver ([place])   . Clotting disorder ([place])   . Foreign body 08-16-12   nose"BB" pellet since age 9  . GERD (gastroesophageal reflux disease)   . History of ETOH abuse 08-16-12   Heavy alcohol use daily  . Pancreatitis 08-16-12   at present weakness,fatiques easily  . Substance abuse   . Transfusion history 08-16-12   '99  . Wernicke encephalopathy     Past Surgical History:  Procedure Laterality Date  . COLONOSCOPY    . ESOPHAGOGASTRODUODENOSCOPY    . EUS N/A 08/18/2012   Procedure: UPPER ENDOSCOPIC ULTRASOUND (EUS) LINEAR;  Surgeon: Beryle Beams, MD;  [facility];  Service: Endoscopy;  Laterality: N/A;  . INSERTION OF MESH N/A 06/18/2015   Procedure: INSERTION OF MESH;  Surgeon: Aviva Signs, MD;  Location: [facility];  Service: General;  Laterality: N/A;  . NECK SURGERY     fusion of neck-titanium hardware inplaced  . NECK SURGERY  [phone]  . UMBILICAL HERNIA REPAIR N/A 06/18/2015   Procedure: HERNIA REPAIR UMBILICAL ADULT WITH MESH;  Surgeon: Aviva Signs, MD;  Location: [facility];  Service: General;  Laterality: N/A;    Social History   Social History  . Marital status: Married    Spouse name: Harriett  . Number of children: 2  . Years of education: 9   Occupational History  .      disabled   Social History Main Topics  . Smoking status: Former Smoker    Packs/day: 2.00    Years: 40.00    Types: Cigarettes    Quit date: 08/16/2005  . Smokeless tobacco: Never Used  . Alcohol use No     Comment: 12  cans beer per day, quit 01/2015  . Drug use: No     Comment: 03/2015 quit 20 yrs ago  . Sexual activity: Yes   Other Topics Concern  . Not on file   Social History Narrative   Lives at home with wife   Caffeine use- sodas 2 a day    Current Outpatient Prescriptions on File Prior to Visit  Medication Sig Dispense Refill  . Artificial Tear Ointment (DRY EYES OP) Apply 2 drops to eye daily as needed (dry eyes).    . diphenhydrAMINE (BENADRYL) 25 MG tablet Take 50 mg by mouth every 6 (six) hours as needed for allergies.    . folic acid (FOLVITE) A999333 MCG tablet Take 400 mcg by mouth daily.    Marland Kitchen lactulose (CHRONULAC) 10 GM/15ML solution Take 45 mLs (30 g total) by mouth 3 (three) times daily. (Patient taking differently: Take 20 g by mouth 2 (two) times daily. 6 tablespoons daily) 946 mL 0  . QUEtiapine (SEROQUEL) 25 MG tablet Take 2-4 tablets by mouth 2 (two) times daily. Take 2 tablets twice daily and 3-4 tablets at bedtime.    Marland Kitchen spironolactone (ALDACTONE) 100 MG tablet Take 1 tablet (100 mg total) by mouth daily. (Patient taking differently: Take 50 mg  by mouth daily. ) 30 tablet 0  . tamoxifen (NOLVADEX) 10 MG tablet Take 1 tablet (10 mg total) by mouth daily. 30 tablet 11  . thiamine 100 MG tablet Take 1 tablet (100 mg total) by mouth daily. 30 tablet 0  . traZODone (DESYREL) 50 MG tablet Take 100 mg by mouth at bedtime.    . methocarbamol (ROBAXIN) 750 MG tablet Take 750 mg by mouth.    . naproxen sodium (ANAPROX) 220 MG tablet Take 220 mg by mouth daily as needed (pain).    . OXYCODONE ER PO Take by mouth.     Current Facility-Administered Medications on File Prior to Visit  Medication Dose Route Frequency Provider Last Rate Last Dose  . tamoxifen (NOLVADEX) tablet 10 mg  10 mg Oral Daily Renato Shin, MD        Allergies  Allergen Reactions  . Aspirin Other (See Comments)    Has upset stomach in high doses only. Took 4 aspirin with EMS, no reaction.    Family History  Problem  Relation Age of Onset  . Hypertension Father   . Cancer Mother 41    lung  . Stroke Mother   . Other Neg Hx     gynecomastia    BP 120/66   Pulse 88   Ht 5\' 7"  (1.702 m)   Wt 194 lb (88 kg)   SpO2 97%   BMI 30.38 kg/m   Review of Systems Denies decreased urinary stream    Objective:   Physical Exam VITAL SIGNS:  See vs page GENERAL: no distress BREASTS: mild bilat pseudogynecomastia, but only minimal bilat gynecomastia.        Assessment & Plan:  Gynecomastia: well-controlled Patient is advised the following: Patient Instructions  Please continue the same tamoxifen medication.  Weight loss helps the breast pain, also.  Please come back for a follow-up appointment in 1 year

## 2016-01-27 ENCOUNTER — Ambulatory Visit (INDEPENDENT_AMBULATORY_CARE_PROVIDER_SITE_OTHER): Payer: 59 | Admitting: Diagnostic Neuroimaging

## 2016-01-27 ENCOUNTER — Encounter: Payer: Self-pay | Admitting: Diagnostic Neuroimaging

## 2016-01-27 VITALS — BP 118/78 | HR 83 | Wt 203.0 lb

## 2016-01-27 DIAGNOSIS — E512 Wernicke's encephalopathy: Secondary | ICD-10-CM | POA: Diagnosis not present

## 2016-01-27 DIAGNOSIS — N62 Hypertrophy of breast: Secondary | ICD-10-CM

## 2016-01-27 DIAGNOSIS — R269 Unspecified abnormalities of gait and mobility: Secondary | ICD-10-CM

## 2016-01-27 DIAGNOSIS — R413 Other amnesia: Secondary | ICD-10-CM

## 2016-01-27 DIAGNOSIS — F1020 Alcohol dependence, uncomplicated: Secondary | ICD-10-CM | POA: Diagnosis not present

## 2016-01-27 DIAGNOSIS — R6882 Decreased libido: Secondary | ICD-10-CM | POA: Diagnosis not present

## 2016-01-27 NOTE — Progress Notes (Signed)
[facility]  PATIENT: [name] DOB: 03-10-1956  REFERRING CLINICIAN: S Ghimire HISTORY FROM: patient and wife  REASON FOR VISIT: follow up    HISTORICAL  CHIEF COMPLAINT:  Chief Complaint  Patient presents with  . Wernicke's encephalopathy    rm 7, wife- Reino Bellis, "still trouble remembering, concentrating"  . Follow-up    6 month    HISTORY OF PRESENT ILLNESS:   UPDATE 01/27/16: Since last visit, symptoms of memory loss are stable. No new events. Now on tamoxifen for gynecomastia and mastalgia.   UPDATE 08/05/15: Since last visit, still with decline in memory. Now with sore breasts, enlarged breast tissue, low libido. Tolerating meds. Has followed up with PCP. GI eval pending. Mood improved.   UPDATE 06/24/15: Since last visit, was doing about the same, until March 2017, then gradual decline of speech, balance, memory and confusion. More withdrawn. Less activity. Less appetite and eating. Has lost 20lbs. Possibly more depression.   PRIOR HPI (04/15/15): 60 year old male with long history of alcohol abuse, here for evaluation of wernicke's encephalopathy. Patient was recently admitted to hospital from 03/27/15 until 04/03/15 for confusion, double vision and gait difficulty. He was diagnosed with suspected Wernicke's encephalopathy and treated with IV thiamine. Neurology and psychiatry to teams were consulted. Initially patient was planned to be discharged to skilled nursing facility unfortunately due to financial services patient was discharged home with home health services. Since discharge home patient is doing better not back to baseline. Patient has greater than 10 year history of severe alcohol abuse, drinking greater than 12 beers per day. His health has been complicated by pancreatitis, anemia, thrombocytopenia, liver cirrhosis, depression and anxiety. Apparently patient stopped taking alcohol in early November 2016. In spite of this he has had several  hospitalizations for confusion and metabolic disturbances.   REVIEW OF SYSTEMS: Full 14 system review of systems performed and negative except for: memory loss confusion weakness slurred speech difficulty swallowing insomnia depression anxiety pain incontinence weight loss blurred vision eye pain. Agitation anxiety depression. Excess sweating.   ALLERGIES: Allergies  Allergen Reactions  . Aspirin Other (See Comments)    Has upset stomach in high doses only. Took 4 aspirin with EMS, no reaction.    HOME MEDICATIONS: Outpatient Medications Prior to Visit  Medication Sig Dispense Refill  . Artificial Tear Ointment (DRY EYES OP) Apply 2 drops to eye daily as needed (dry eyes).    . diphenhydrAMINE (BENADRYL) 25 MG tablet Take 50 mg by mouth every 6 (six) hours as needed for allergies.    . folic acid (FOLVITE) A999333 MCG tablet Take 400 mcg by mouth daily.    Marland Kitchen lactulose (CHRONULAC) 10 GM/15ML solution Take 45 mLs (30 g total) by mouth 3 (three) times daily. (Patient taking differently: Take 20 g by mouth 2 (two) times daily. 6 tablespoons daily) 946 mL 0  . QUEtiapine (SEROQUEL) 25 MG tablet Take 2-4 tablets by mouth 2 (two) times daily. Take 2 tablets twice daily and 3-4 tablets at bedtime.    Marland Kitchen spironolactone (ALDACTONE) 100 MG tablet Take 1 tablet (100 mg total) by mouth daily. (Patient taking differently: Take 50 mg by mouth daily. ) 30 tablet 0  . tamoxifen (NOLVADEX) 10 MG tablet Take 1 tablet (10 mg total) by mouth daily. 30 tablet 11  . thiamine 100 MG tablet Take 1 tablet (100 mg total) by mouth daily. 30 tablet 0  . traZODone (DESYREL) 50 MG tablet Take 100 mg by mouth at bedtime.    Marland Kitchen  methocarbamol (ROBAXIN) 750 MG tablet Take 750 mg by mouth.    . naproxen sodium (ANAPROX) 220 MG tablet Take 220 mg by mouth daily as needed (pain).    . OXYCODONE ER PO Take by mouth.     Facility-Administered Medications Prior to Visit  Medication Dose Route Frequency Provider Last Rate Last Dose    . tamoxifen (NOLVADEX) tablet 10 mg  10 mg Oral Daily Renato Shin, MD        PAST MEDICAL HISTORY: Past Medical History:  Diagnosis Date  . Anemia   . Anxiety   . Arthritis 08-16-12   generalized arthritis  . Cirrhosis ([place])   . Cirrhosis of liver ([place])   . Clotting disorder ([place])   . Foreign body 08-16-12   nose"BB" pellet since age 28  . GERD (gastroesophageal reflux disease)   . History of ETOH abuse 08-16-12   Heavy alcohol use daily  . Pancreatitis 08-16-12   at present weakness,fatiques easily  . Substance abuse   . Transfusion history 08-16-12   '99  . Wernicke encephalopathy     PAST SURGICAL HISTORY: Past Surgical History:  Procedure Laterality Date  . COLONOSCOPY    . ESOPHAGOGASTRODUODENOSCOPY    . EUS N/A 08/18/2012   Procedure: UPPER ENDOSCOPIC ULTRASOUND (EUS) LINEAR;  Surgeon: Beryle Beams, MD;  Location: [facility];  Service: Endoscopy;  Laterality: N/A;  . INSERTION OF MESH N/A 06/18/2015   Procedure: INSERTION OF MESH;  Surgeon: Aviva Signs, MD;  Location: [facility];  Service: General;  Laterality: N/A;  . NECK SURGERY     fusion of neck-titanium hardware inplaced  . NECK SURGERY  [phone]  . UMBILICAL HERNIA REPAIR N/A 06/18/2015   Procedure: HERNIA REPAIR UMBILICAL ADULT WITH MESH;  Surgeon: Aviva Signs, MD;  Location: [facility];  Service: General;  Laterality: N/A;    FAMILY HISTORY: Family History  Problem Relation Age of Onset  . Hypertension Father   . Cancer Mother 63    lung  . Stroke Mother   . Other Neg Hx     gynecomastia    SOCIAL HISTORY:  Social History   Social History  . Marital status: Married    Spouse name: Harriett  . Number of children: 2  . Years of education: 9   Occupational History  .      disabled   Social History Main Topics  . Smoking status: Former Smoker    Packs/day: 2.00    Years: 40.00    Types: Cigarettes    Quit date: 08/16/2005  . Smokeless tobacco: Never Used  . Alcohol use No     Comment: 12 cans beer  per day, quit 01/2015  . Drug use: No     Comment: 03/2015 quit 20 yrs ago  . Sexual activity: Yes   Other Topics Concern  . Not on file   Social History Narrative   Lives at home with wife   Caffeine use- sodas 2 a day     PHYSICAL EXAM  GENERAL EXAM/CONSTITUTIONAL: Vitals:  Vitals:   01/27/16 1354  BP: 118/78  Pulse: 83  Weight: 203 lb (92.1 kg)   Body mass index is 31.79 kg/m. No exam data present  Patient is in no distress; well developed, nourished and groomed; neck is supple  CARDIOVASCULAR:  Examination of carotid arteries is normal; no carotid bruits  Regular rate and rhythm, no murmurs  Examination of peripheral vascular system by observation and palpation is normal  EYES:  Ophthalmoscopic  exam of optic discs and posterior segments is normal; no papilledema or hemorrhages  MUSCULOSKELETAL:  Gait, strength, tone, movements noted in Neurologic exam below  NEUROLOGIC: MENTAL STATUS:  No flowsheet data found.  awake, alert, oriented to person  [facility] memory   DECR attention and concentration  DECR FLUENCY  fund of knowledge appropriate  CRANIAL NERVE:   2nd, 3rd, 4th, 6th - pupils equal and reactive to light, visual fields full to confrontation, extraocular muscles intact, no nystagmus  5th - facial sensation symmetric  7th - facial strength symmetric  8th - hearing intact  9th - palate elevates symmetrically, uvula midline  11th - shoulder shrug symmetric  12th - tongue protrusion midline  MOTOR:   normal bulk and tone, full strength in the BUE, BLE  SENSORY:   normal and symmetric to light touch, temperature, vibration  COORDINATION:   finger-nose-finger, fine finger movements SLOW; MILD DYSMETRIA  NO ASTERIXIS  REFLEXES:   deep tendon reflexes TRACE and symmetric  GAIT/STATION:   narrow based gait; SLOW SLIGHTLY ATAXIC    DIAGNOSTIC DATA (LABS, IMAGING, TESTING) - I reviewed patient records, labs, notes, testing  and imaging myself where available.  Lab Results  Component Value Date   WBC 4.2 06/24/2015   HGB 9.8 (L) 06/13/2015   HCT 34.9 (L) 06/24/2015   MCV 94 06/24/2015   PLT 106 (L) 06/24/2015      Component Value Date/Time   NA 136 06/24/2015 1432   K 4.2 06/24/2015 1432   CL 96 06/24/2015 1432   CO2 22 06/24/2015 1432   GLUCOSE 83 06/24/2015 1432   GLUCOSE 111 (H) 06/13/2015 1000   BUN 35 (H) 06/24/2015 1432   CREATININE 1.24 06/24/2015 1432   CALCIUM 10.1 06/24/2015 1432   PROT 8.7 (H) 06/24/2015 1432   ALBUMIN 4.3 06/24/2015 1432   AST 28 06/24/2015 1432   ALT 8 06/24/2015 1432   ALKPHOS 96 06/24/2015 1432   BILITOT 1.0 06/24/2015 1432   GFRNONAA 63 06/24/2015 1432   GFRAA 73 06/24/2015 1432   Lab Results  Component Value Date   CHOL  06/15/2007    149        ATP III CLASSIFICATION:  <200     mg/dL   Desirable  200-239  mg/dL   Borderline High  >=240    mg/dL   High   HDL 17 (L) 06/15/2007   LDLCALC  06/15/2007    98        Total Cholesterol/HDL:CHD Risk Coronary Heart Disease Risk Table                     Men   Women  1/2 Average Risk   3.4   3.3   TRIG 170 (H) 06/15/2007   CHOLHDL 8.8 06/15/2007   No results found for: HGBA1C Lab Results  Component Value Date   VITAMINB12 763 06/24/2015   Lab Results  Component Value Date   TSH 2.020 06/24/2015    03/20/15 MRI brain [I reviewed images myself and agree with interpretation. -VRP]  1. Incomplete examination with motion and magnetic susceptibility artifact as above. 2. No acute intracranial abnormality identified. 3. Mildly advanced cerebral atrophy for age.  03/29/15 EEG  - Generalized non-specific cerebral dysfunction(encephalopathy). There was no seizure or seizure predisposition recorded on this study.     ASSESSMENT AND PLAN  60 y.o. year old male here with long history of alcohol abuse, with recurrent confusional episodes in setting of metabolic disturbance and  suspected Wernicke's  encephalopathy. Symptoms slightly improved in Jan / Feb 2017, then gradually worsening since March 2017, now stable in Nov 2017. Depression and anxiety are still an issue.   Dx:  Wernicke's encephalopathy  Gynecomastia  Decreased libido  Alcohol use disorder, severe, dependence (HCC)  Gait difficulty  Memory loss    PLAN: - reviewed importance of nutrition, physical activity, stress mgmt and sleep - follow up with endocrinology mastalgia - consider psychiatry/psychology evaluation for depression and anxiety  Return if symptoms worsen or fail to improve, for return to PCP.    Penni Bombard, MD [id number], 123456 PM Certified in Neurology, Neurophysiology and Neuroimaging  [facility] [address] [place], [place] [address] [phone]

## 2016-01-29 ENCOUNTER — Other Ambulatory Visit: Payer: Self-pay | Admitting: Gastroenterology

## 2016-02-02 ENCOUNTER — Encounter (HOSPITAL_COMMUNITY): Payer: Self-pay | Admitting: *Deleted

## 2016-02-03 ENCOUNTER — Encounter (HOSPITAL_COMMUNITY): Payer: Self-pay | Admitting: *Deleted

## 2016-02-03 NOTE — Progress Notes (Signed)
02-03-16 Call received from spouse -Reino Bellis- reviewed instructions and confirmed medical history.

## 2016-02-04 ENCOUNTER — Ambulatory Visit: Payer: 59 | Admitting: Diagnostic Neuroimaging

## 2016-02-10 ENCOUNTER — Other Ambulatory Visit: Payer: Self-pay | Admitting: Gastroenterology

## 2016-02-11 ENCOUNTER — Ambulatory Visit (HOSPITAL_COMMUNITY): Payer: 59 | Admitting: Anesthesiology

## 2016-02-11 ENCOUNTER — Encounter (HOSPITAL_COMMUNITY)
Admission: RE | Disposition: A | Discharge status: Home / Self Care | Payer: Self-pay | Source: Ambulatory Visit | Attending: Gastroenterology

## 2016-02-11 ENCOUNTER — Ambulatory Visit (HOSPITAL_COMMUNITY)
Admission: RE | Admit: 2016-02-11 | Discharge: 2016-02-11 | Disposition: A | Discharge status: Home / Self Care | Payer: 59 | Source: Ambulatory Visit | Attending: Gastroenterology | Admitting: Gastroenterology

## 2016-02-11 ENCOUNTER — Encounter (HOSPITAL_COMMUNITY): Payer: Self-pay | Admitting: *Deleted

## 2016-02-11 DIAGNOSIS — K7460 Unspecified cirrhosis of liver: Secondary | ICD-10-CM | POA: Insufficient documentation

## 2016-02-11 DIAGNOSIS — R531 Weakness: Secondary | ICD-10-CM | POA: Insufficient documentation

## 2016-02-11 DIAGNOSIS — R188 Other ascites: Secondary | ICD-10-CM | POA: Insufficient documentation

## 2016-02-11 DIAGNOSIS — I10 Essential (primary) hypertension: Secondary | ICD-10-CM | POA: Diagnosis not present

## 2016-02-11 DIAGNOSIS — F17200 Nicotine dependence, unspecified, uncomplicated: Secondary | ICD-10-CM | POA: Diagnosis not present

## 2016-02-11 DIAGNOSIS — K766 Portal hypertension: Secondary | ICD-10-CM | POA: Insufficient documentation

## 2016-02-11 DIAGNOSIS — I8510 Secondary esophageal varices without bleeding: Secondary | ICD-10-CM | POA: Insufficient documentation

## 2016-02-11 DIAGNOSIS — Z79899 Other long term (current) drug therapy: Secondary | ICD-10-CM | POA: Diagnosis not present

## 2016-02-11 DIAGNOSIS — K3189 Other diseases of stomach and duodenum: Secondary | ICD-10-CM | POA: Diagnosis not present

## 2016-02-11 DIAGNOSIS — F419 Anxiety disorder, unspecified: Secondary | ICD-10-CM | POA: Diagnosis not present

## 2016-02-11 DIAGNOSIS — Z79810 Long term (current) use of selective estrogen receptor modulators (SERMs): Secondary | ICD-10-CM | POA: Insufficient documentation

## 2016-02-11 HISTORY — DX: Thrombocytopenia, unspecified: D69.6

## 2016-02-11 HISTORY — PX: ESOPHAGOGASTRODUODENOSCOPY (EGD) WITH PROPOFOL: SHX5813

## 2016-02-11 SURGERY — ESOPHAGOGASTRODUODENOSCOPY (EGD) WITH PROPOFOL
Anesthesia: Monitor Anesthesia Care

## 2016-02-11 MED ORDER — SODIUM CHLORIDE 0.9 % IV SOLN: INTRAVENOUS | Status: DC

## 2016-02-11 MED ORDER — PROPOFOL 10 MG/ML IV BOLUS
INTRAVENOUS | Status: DC | PRN
  Administered 2016-02-11: 30 mg via INTRAVENOUS
  Administered 2016-02-11 (×2): 20 mg via INTRAVENOUS

## 2016-02-11 MED ORDER — LACTATED RINGERS IV SOLN
INTRAVENOUS | Status: DC
  Administered 2016-02-11: 1000 mL via INTRAVENOUS

## 2016-02-11 MED ORDER — PROPOFOL 500 MG/50ML IV EMUL
INTRAVENOUS | Status: DC | PRN
  Administered 2016-02-11: 75 ug/kg/min via INTRAVENOUS

## 2016-02-11 MED ORDER — LIDOCAINE 2% (20 MG/ML) 5 ML SYRINGE
INTRAMUSCULAR | Status: DC | PRN
  Administered 2016-02-11: 100 mg via INTRAVENOUS

## 2016-02-11 MED ORDER — LIDOCAINE 2% (20 MG/ML) 5 ML SYRINGE
INTRAMUSCULAR | Status: AC
  Filled 2016-02-11: qty 5

## 2016-02-11 MED ORDER — PROPOFOL 10 MG/ML IV BOLUS
INTRAVENOUS | Status: AC
  Filled 2016-02-11: qty 40

## 2016-02-11 SURGICAL SUPPLY — 15 items

## 2016-02-11 NOTE — Op Note (Signed)
[facility] Patient Name: [name] Procedure Date: 02/11/2016 MRN: [id number] Attending MD: Arta Silence , MD Date of Birth: Aug 05, 1955 CSN: [id number] Age: 60 Admit Type: Outpatient Procedure:                Upper GI endoscopy Indications:              Screening procedure, cirrhosis Providers:                Arta Silence, MD, Cleda Daub, RN, Elspeth Cho Tech., Technician, [facility],                            CRNA Referring MD:              Medicines:                Monitored Anesthesia Care Complications:            No immediate complications. Estimated Blood Loss:     Estimated blood loss: none. Procedure:                Pre-Anesthesia Assessment:                           - Prior to the procedure, a History and Physical                            was performed, and patient medications and                            allergies were reviewed. The patient's tolerance of                            previous anesthesia was also reviewed. The risks                            and benefits of the procedure and the sedation                            options and risks were discussed with the patient.                            All questions were answered, and informed consent                            was obtained. Prior Anticoagulants: The patient has                            taken no previous anticoagulant or antiplatelet                            agents. ASA Grade Assessment: IV - A patient with                            severe systemic  disease that is a constant threat                            to life. After reviewing the risks and benefits,                            the patient was deemed in satisfactory condition to                            undergo the procedure.                           After obtaining informed consent, the endoscope was                            passed under direct vision. Throughout  the                            procedure, the patient's blood pressure, pulse, and                            oxygen saturations were monitored continuously. The                            Endoscope was introduced through the mouth, and                            advanced to the second part of duodenum. The upper                            GI endoscopy was accomplished without difficulty.                            The patient tolerated the procedure well. Scope In: Scope Out: Findings:      Diminutive Grade I varices were found in the lower third of the       esophagus. They were small in size.      The exam of the esophagus was otherwise normal.      Moderate portal hypertensive gastropathy was found in the entire       examined stomach.      The exam of the stomach was otherwise normal. No gastric varices were       noted upon retroflexion into the cardia.      The duodenal bulb, first portion of the duodenum and second portion of       the duodenum were normal. Impression:               - Diminutive Grade I esophageal varices.                           - Portal hypertensive gastropathy.                           - Normal duodenal bulb, first portion of the  duodenum and second portion of the duodenum. Moderate Sedation:      None Recommendation:           - Patient has a contact number available for                            emergencies. The signs and symptoms of potential                            delayed complications were discussed with the                            patient. Return to normal activities tomorrow.                            Written discharge instructions were provided to the                            patient.                           - Discharge patient to home (via wheelchair).                           - Resume previous diet today.                           - Continue present medications.                           - Repeat upper  endoscopy in 2-3 years for screening                            purposes. Varices are small; don't need beta                            blockade therapy at the present time.                           - Return to GI clinic in 3 months.                           - Return to referring physician as previously                            scheduled. Procedure Code(s):        --- Professional ---                           [phone], Esophagogastroduodenoscopy, flexible,                            transoral; diagnostic, including collection of                            specimen(s) by brushing or washing, when performed                            (  separate procedure) Diagnosis Code(s):        --- Professional ---                           I85.00, Esophageal varices without bleeding                           K76.6, Portal hypertension                           K31.89, Other diseases of stomach and duodenum                           Z13.810, Encounter for screening for upper                            gastrointestinal disorder CPT copyright 2016 American Medical Association. All rights reserved. The codes documented in this report are preliminary and upon coder review may  be revised to meet current compliance requirements. Arta Silence, MD 02/11/2016 9:24:59 AM This report has been signed electronically. Number of Addenda: 0

## 2016-02-11 NOTE — Anesthesia Postprocedure Evaluation (Signed)
Anesthesia Post Note  Patient: [name]  Procedure(s) Performed: Procedure(s) (LRB): ESOPHAGOGASTRODUODENOSCOPY (EGD) WITH PROPOFOL (N/A)  Patient location during evaluation: PACU Anesthesia Type: MAC Level of consciousness: awake and alert Pain management: pain level controlled Vital Signs Assessment: post-procedure vital signs reviewed and stable Respiratory status: spontaneous breathing, nonlabored ventilation, respiratory function stable and patient connected to nasal cannula oxygen Cardiovascular status: stable and blood pressure returned to baseline Anesthetic complications: no    Last Vitals:  Vitals:   02/11/16 0945 02/11/16 0950  BP:  128/81  Pulse: 77 79  Resp: 15 13  Temp:      Last Pain:  Vitals:   02/11/16 0931  TempSrc: Oral                 Montez Hageman

## 2016-02-11 NOTE — Transfer of Care (Signed)
Immediate Anesthesia Transfer of Care Note  Patient: [name]  Procedure(s) Performed: Procedure(s): ESOPHAGOGASTRODUODENOSCOPY (EGD) WITH PROPOFOL (N/A)  Patient Location: PACU  Anesthesia Type:MAC  Level of Consciousness: awake, alert  and oriented  Airway & Oxygen Therapy: Patient Spontanous Breathing and Patient connected to nasal cannula oxygen  Post-op Assessment: Report given to RN and Post -op Vital signs reviewed and stable  Post vital signs: Reviewed and stable  Last Vitals:  Vitals:   02/11/16 0818  Pulse: 78  Resp: 10  Temp: 36.6 C    Last Pain:  Vitals:   02/11/16 0818  TempSrc: Oral         Complications: No apparent anesthesia complications

## 2016-02-11 NOTE — Discharge Instructions (Signed)
Endoscopy °Care After °Please read the instructions outlined below and refer to this sheet in the next few weeks. These discharge instructions provide you with general information on caring for yourself after you leave the hospital. Your doctor may also give you specific instructions. While your treatment has been planned according to the most current medical practices available, unavoidable complications occasionally occur. If you have any problems or questions after discharge, please call Dr. Jerimiah Wolman ([facility]) at [phone]. ° °HOME CARE INSTRUCTIONS °Activity °· You may resume your regular activity but move at a slower pace for the next 24 hours.  °· Take frequent rest periods for the next 24 hours.  °· Walking will help expel (get rid of) the air and reduce the bloated feeling in your abdomen.  °· No driving for 24 hours (because of the anesthesia (medicine) used during the test).  °· You may shower.  °· Do not sign any important legal documents or operate any machinery for 24 hours (because of the anesthesia used during the test).  °Nutrition °· Drink plenty of fluids.  °· You may resume your normal diet.  °· Begin with a light meal and progress to your normal diet.  °· Avoid alcoholic beverages for 24 hours or as instructed by your caregiver.  °Medications °You may resume your normal medications unless your caregiver tells you otherwise. °What you can expect today °· You may experience abdominal discomfort such as a feeling of fullness or "gas" pains.  °· You may experience a sore throat for 2 to 3 days. This is normal. Gargling with salt water may help this.  °·  °SEEK IMMEDIATE MEDICAL CARE IF: °· You have excessive nausea (feeling sick to your stomach) and/or vomiting.  °· You have severe abdominal pain and distention (swelling).  °· You have trouble swallowing.  °· You have a temperature over 100° F (37.8° C).  °· You have rectal bleeding or vomiting of blood.  °Document Released:  10/14/2003 Document Revised: 11/11/2010 Document Reviewed: 04/26/2007 °ExitCare® Patient Information ©2012 [employer]. °

## 2016-02-11 NOTE — H&P (Signed)
Patient interval history reviewed.  Patient examined again.  There has been no change from documented H/P dated 01/20/16 (scanned into chart from our office) except as documented above.  Assessment:  1.  Cirrhosis.  Plan:  1.  Endoscopy for variceal screening. 2.  Risks (bleeding, infection, bowel perforation that could require surgery, sedation-related changes in cardiopulmonary systems), benefits (identification and possible treatment of source of symptoms, exclusion of certain causes of symptoms), and alternatives (watchful waiting, radiographic imaging studies, empiric medical treatment) of upper endoscopy (EGD) were explained to patient/family in detail and patient wishes to proceed.

## 2016-02-11 NOTE — Anesthesia Preprocedure Evaluation (Signed)
Anesthesia Evaluation  Patient identified by MRN, date of birth, ID band Patient awake    Reviewed: Allergy & Precautions, H&P , NPO status , Patient's Chart, lab work & pertinent test results  Airway Mallampati: II  TM Distance: >3 FB Neck ROM: full    Dental  (+) Edentulous Upper, Edentulous Lower   Pulmonary Current Smoker, former smoker,    Pulmonary exam normal breath sounds clear to auscultation       Cardiovascular Exercise Tolerance: Good hypertension, Pt. on medications Normal cardiovascular exam Rhythm:regular Rate:Normal  Untreated htn   Neuro/Psych PSYCHIATRIC DISORDERS Anxiety Wernicke encephalopathy    GI/Hepatic neg GERD  ,(+) Cirrhosis     substance abuse  alcohol use, Pancreatitis  pancreatitis   Endo/Other  negative endocrine ROS  Renal/GU negative Renal ROS  negative genitourinary   Musculoskeletal   Abdominal   Peds  Hematology  (+) Blood dyscrasia (thrombocytopenia), anemia ,   Anesthesia Other Findings   Reproductive/Obstetrics negative OB ROS                             Anesthesia Physical  Anesthesia Plan  ASA: IV  Anesthesia Plan: MAC   Post-op Pain Management:    Induction:   Airway Management Planned: Natural Airway  Additional Equipment:   Intra-op Plan:   Post-operative Plan: Extubation in OR  Informed Consent: I have reviewed the patients History and Physical, chart, labs and discussed the procedure including the risks, benefits and alternatives for the proposed anesthesia with the patient or authorized representative who has indicated his/her understanding and acceptance.     Plan Discussed with:   Anesthesia Plan Comments:         Anesthesia Quick Evaluation

## 2016-02-12 ENCOUNTER — Encounter (HOSPITAL_COMMUNITY): Payer: Self-pay | Admitting: Gastroenterology

## 2016-02-18 DIAGNOSIS — F4329 Adjustment disorder with other symptoms: Secondary | ICD-10-CM | POA: Insufficient documentation

## 2016-04-19 ENCOUNTER — Other Ambulatory Visit: Payer: Self-pay | Admitting: Gastroenterology

## 2016-04-19 DIAGNOSIS — K7469 Other cirrhosis of liver: Secondary | ICD-10-CM

## 2016-05-03 ENCOUNTER — Ambulatory Visit
Admission: RE | Admit: 2016-05-03 | Discharge: 2016-05-03 | Disposition: A | Discharge status: Home / Self Care | Payer: 59 | Source: Ambulatory Visit | Attending: Gastroenterology | Admitting: Gastroenterology

## 2016-05-03 DIAGNOSIS — K7469 Other cirrhosis of liver: Secondary | ICD-10-CM

## 2016-07-24 IMAGING — MR MR CERVICAL SPINE W/O CM
5 series · 34 of 48 positions shown · non-contrast
Comparison: Cervical spine CT myelogram 12/24/2013 and MRI
09/29/2013

CLINICAL DATA: Cervical spondylosis. Neck pain for 2 years
extending into the right arm with associated numbness. 2 prior
cervical spine surgeries, most recently 5 years ago.

EXAM:
MRI CERVICAL SPINE WITHOUT CONTRAST
TECHNIQUE: Multiplanar, multisequence MR imaging of the cervical spine was
performed. No intravenous contrast was administered.

[Series 3: T2 · sagittal · 3.0mm · 0.66mm/px · 6 of 12 slices shown (1 of 2)]
[im 1/12]
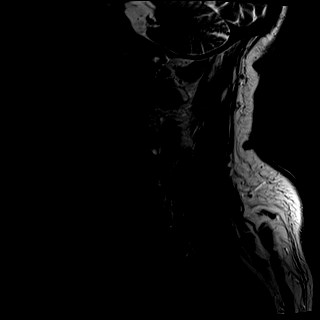
[im 3/12]
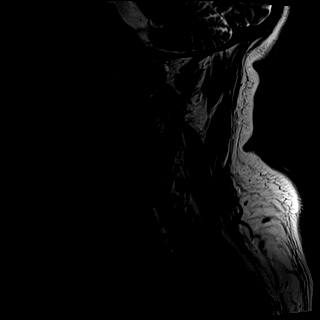
[im 5/12]
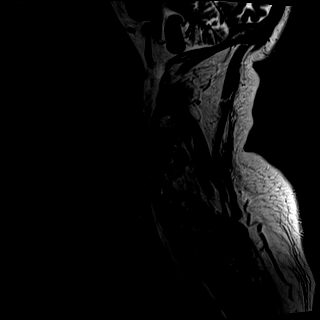
[im 7/12]
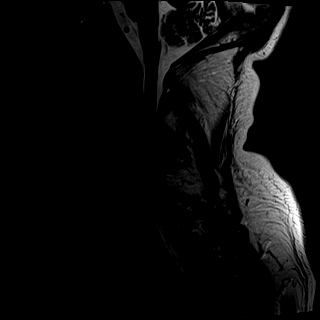
[im 9/12]
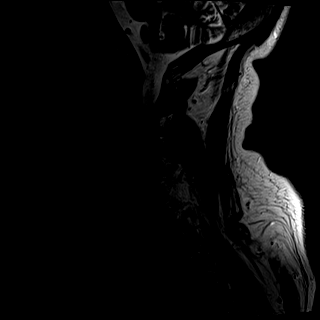
[im 12/12]
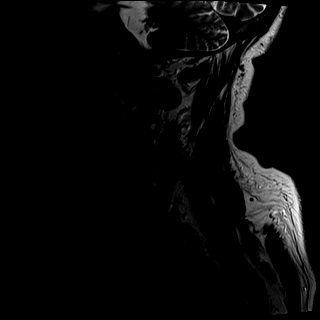

[Series 4: T1 · sagittal · 3.0mm · 0.82mm/px · 6 of 12 slices shown]
[im 1/12]
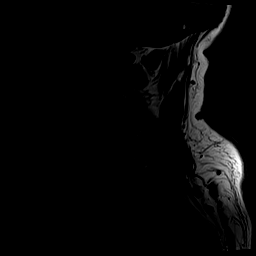
[im 3/12]
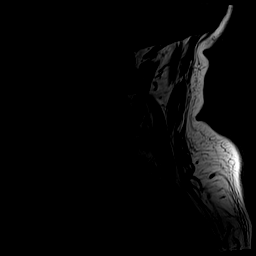
[im 5/12]
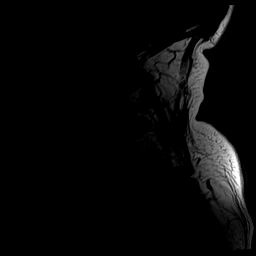
[im 7/12]
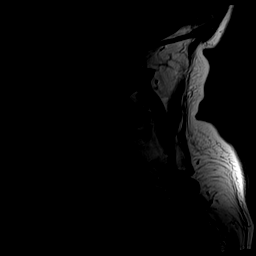
[im 9/12]
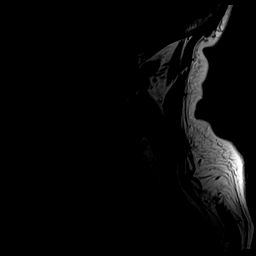
[im 12/12]
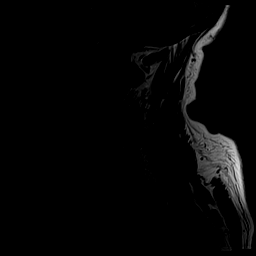

[Series 5: tir sag · sagittal · 3.0mm · 0.82mm/px · 6 of 12 slices shown]
[im 1/12]
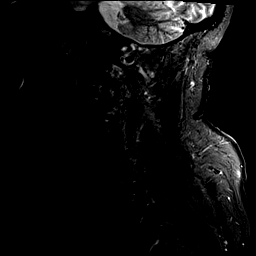
[im 3/12]
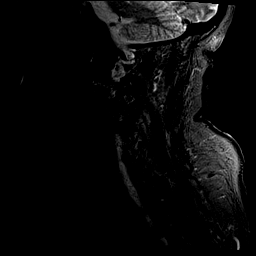
[im 5/12]
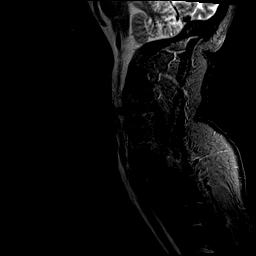
[im 7/12]
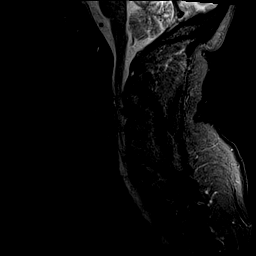
[im 9/12]
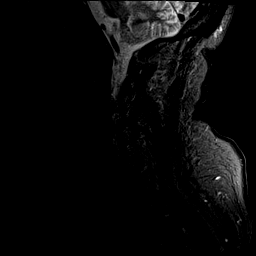
[im 12/12]
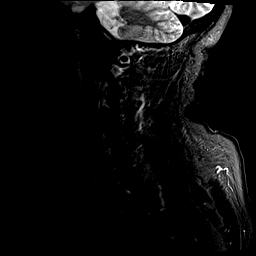

[Series 6: GRE · axial · 3.0mm · 0.35mm/px · z∈[-89,-3]mm · 7 of 28 slices shown]
[im 1/28]
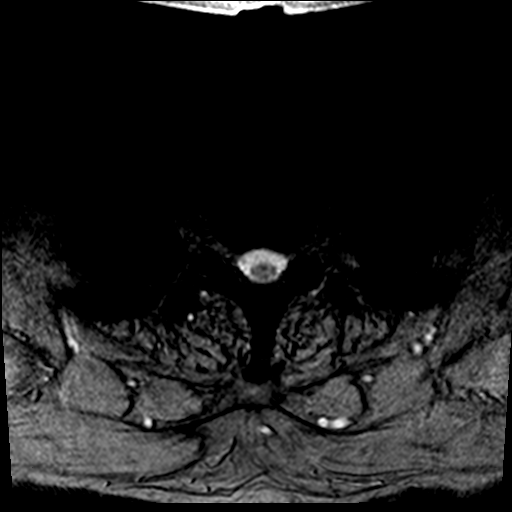
[im 4/28]
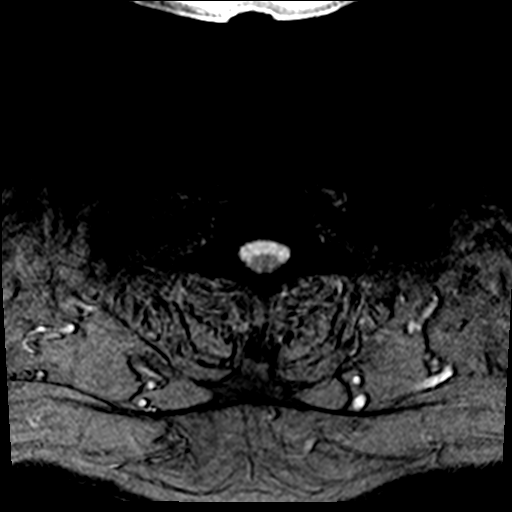
[im 8/28]
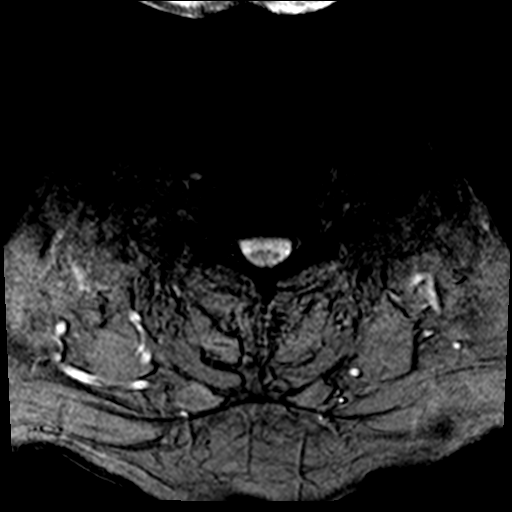
[im 12/28]
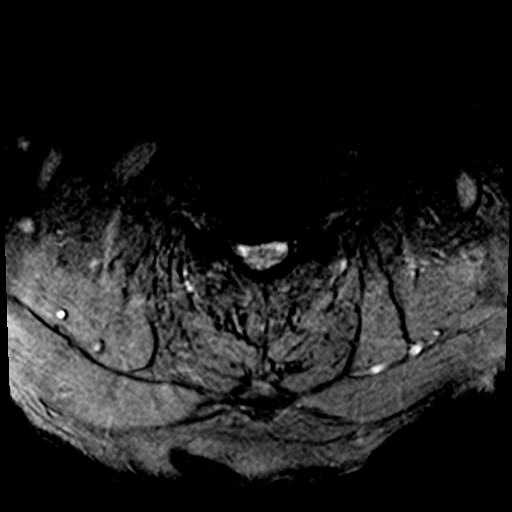
[im 16/28]
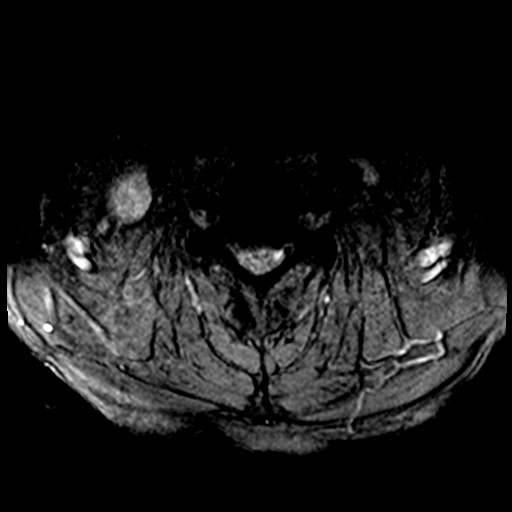
[im 20/28]
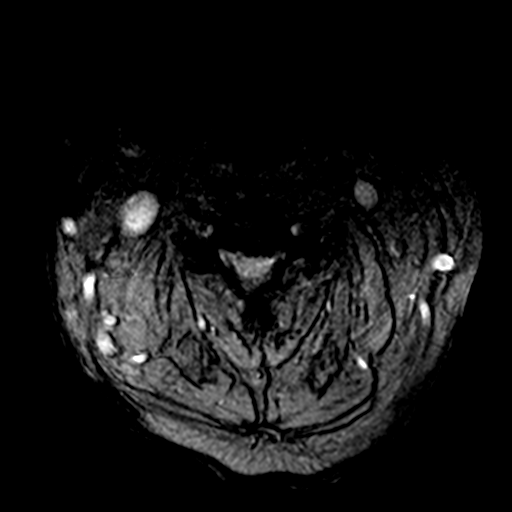
[im 24/28]
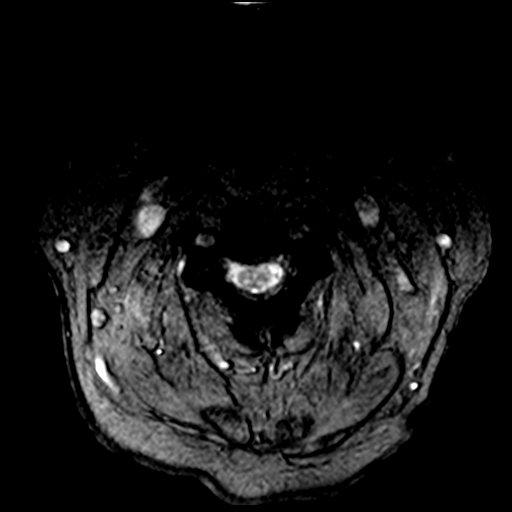

[Series 7: T2 · axial · 3.0mm · 0.47mm/px · z∈[-89,+11]mm · 9 of 28 slices shown (2 of 2)]
[im 1/28]
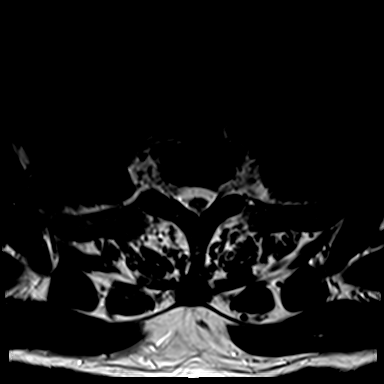
[im 4/28]
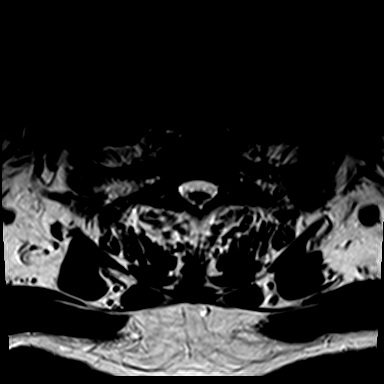
[im 8/28]
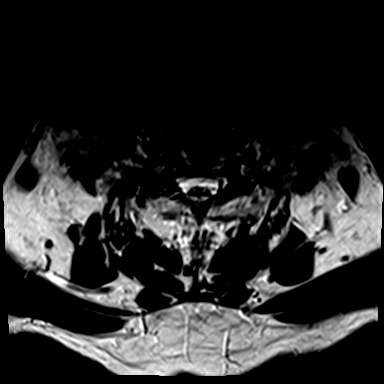
[im 12/28]
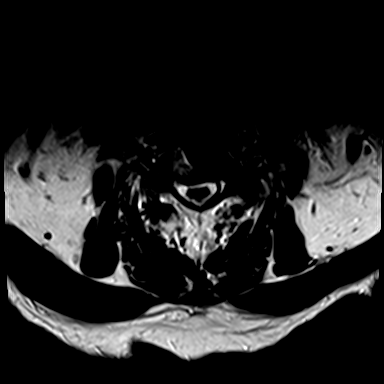
[im 14/28]
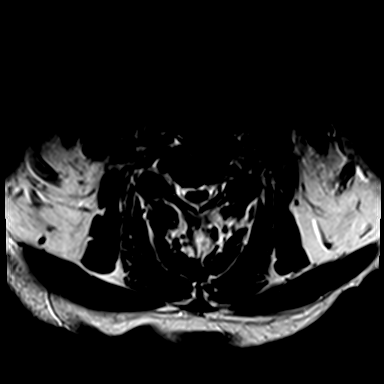
[im 16/28]
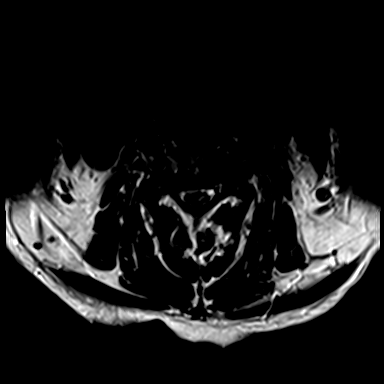
[im 20/28]
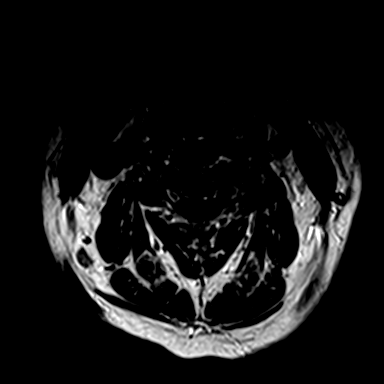
[im 24/28]
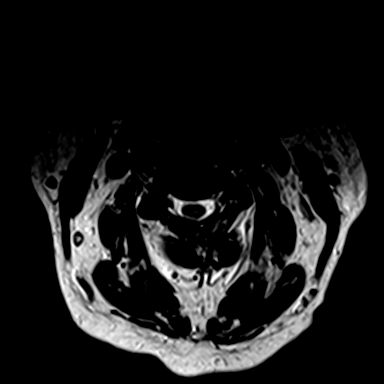
[im 28/28]
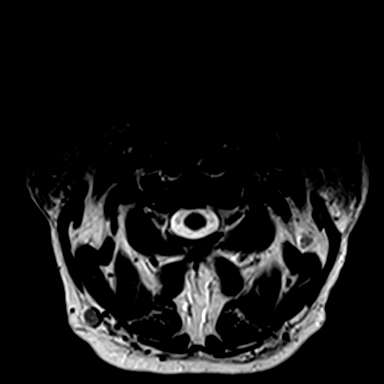

[34 of 48 positions shown; findings below may reference images not displayed]

FINDINGS: Images are mildly to moderately degraded by motion artifact.

Vertebral alignment is unchanged with straightening of the normal
cervical lordosis and slight anterolisthesis of C3 on C4. Sequelae
of prior C5-C7 ACDF are again identified, with an anterior fusion
plate remaining at C6-7. Degenerative marrow changes are present
from C3-C5 including very mild discogenic marrow edema. Disc space
narrowing is mild-to-moderate at C3-4 and moderate to severe at
C4-5. Craniocervical junction is unremarkable. No definite cervical
spinal cord signal abnormality is identified, however evaluation is
limited by motion artifact. Paraspinal soft tissues are
unremarkable.

C2-3: Mild disc bulging and mild left facet arthrosis without
stenosis, unchanged.

C3-4: Broad-based posterior disc osteophyte complex and severe left
facet arthrosis result in moderate spinal stenosis and moderate
right and severe left neural foraminal stenosis, grossly similar to
prior CT. There is minimal impression on the ventral spinal cord.

C4-5: Broad-based posterior disc osteophyte complex results in
moderate spinal stenosis and severe right and moderate to severe
left neural foraminal stenosis, grossly similar to prior CT.

C5-6:  Prior ACDF.  No significant residual stenosis identified.

C6-7: Prior ACDF. Uncovertebral spurring results in moderate right
and mild left neural foraminal stenosis, similar to prior CT. No
spinal stenosis.

C7-T1:  Mild right facet arthrosis without stenosis.
IMPRESSION: 1. Prior C5-C7 ACDF without residual spinal stenosis. Moderate right
neural foraminal narrowing at C6-7.
2. Similar appearance of disc degeneration at C3-4 and C4-5
resulting in moderate spinal stenosis at each level. Right greater
than left neural foraminal stenosis at C4-5.

## 2016-10-10 ENCOUNTER — Other Ambulatory Visit: Payer: Self-pay | Admitting: Endocrinology

## 2016-10-21 DIAGNOSIS — L309 Dermatitis, unspecified: Secondary | ICD-10-CM | POA: Insufficient documentation

## 2016-12-02 ENCOUNTER — Ambulatory Visit: Payer: Self-pay | Admitting: Endocrinology

## 2016-12-02 ENCOUNTER — Ambulatory Visit (INDEPENDENT_AMBULATORY_CARE_PROVIDER_SITE_OTHER): Payer: Medicare Other | Admitting: Endocrinology

## 2016-12-02 VITALS — BP 122/84 | HR 60 | Wt 204.0 lb

## 2016-12-02 DIAGNOSIS — R946 Abnormal results of thyroid function studies: Secondary | ICD-10-CM

## 2016-12-02 DIAGNOSIS — N62 Hypertrophy of breast: Secondary | ICD-10-CM

## 2016-12-02 DIAGNOSIS — Z125 Encounter for screening for malignant neoplasm of prostate: Secondary | ICD-10-CM | POA: Diagnosis not present

## 2016-12-02 DIAGNOSIS — R7989 Other specified abnormal findings of blood chemistry: Secondary | ICD-10-CM

## 2016-12-02 LAB — PSA: PSA: 0.16 ng/mL (ref 0.10–4.00)

## 2016-12-02 LAB — TSH: TSH: 0.32 u[IU]/mL — AB (ref 0.35–4.50)

## 2016-12-02 NOTE — Progress Notes (Signed)
Subjective:    Patient ID: [name], male    DOB: [birth year], 61 y.o.   MRN: [id number]  HPI Pt returns for f/u of gynecomastia (dx'ed 2016; he has 2 biological children; only secondary causes found were chronic alcoholic hepatitis and aldactone; he was rx'ed tamoxifen).  Since on the tamoxifen, mastalgia is much better.   Past Medical History:  Diagnosis Date  . Anemia   . Anxiety   . Arthritis 08-16-12   generalized arthritis  . Cirrhosis ([place])   . Cirrhosis of liver ([place])   . Clotting disorder ([place])   . 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  Marland Kitchen GERD (gastroesophageal reflux disease)   . History of ETOH abuse 08-16-12   Heavy alcohol use daily  . Pancreatitis 08-16-12   at present weakness,fatiques easily  . Platelets decreased ([place])    Was told by Dr. Areta Haber a little low"-not sure how low per spouse -Reino Bellis  . Substance abuse   . Transfusion history 08-16-12   '99  . Wernicke encephalopathy     Past Surgical History:  Procedure Laterality Date  . COLONOSCOPY    . ESOPHAGOGASTRODUODENOSCOPY    . ESOPHAGOGASTRODUODENOSCOPY (EGD) WITH PROPOFOL N/A 02/11/2016   Procedure: ESOPHAGOGASTRODUODENOSCOPY (EGD) WITH PROPOFOL;  Surgeon: Arta Silence, MD;  Location: [facility];  Service: Endoscopy;  Laterality: N/A;  . EUS N/A 08/18/2012   Procedure: UPPER ENDOSCOPIC ULTRASOUND (EUS) LINEAR;  Surgeon: Beryle Beams, MD;  Location: [facility];  Service: Endoscopy;  Laterality: N/A;  . INSERTION OF MESH N/A 06/18/2015   Procedure: INSERTION OF MESH;  Surgeon: Aviva Signs, MD;  Location: [facility];  Service: General;  Laterality: N/A;  . NECK SURGERY     fusion of neck-titanium hardware inplaced  . NECK SURGERY  [phone]  . UMBILICAL HERNIA REPAIR N/A 06/18/2015   Procedure: HERNIA REPAIR UMBILICAL ADULT WITH MESH;  Surgeon: Aviva Signs, MD;  Location: [facility];  Service: General;  Laterality: N/A;    Social History   Social History  . Marital  status: Married    Spouse name: Harriett  . Number of children: 2  . Years of education: 9   Occupational History  .      disabled   Social History Main Topics  . Smoking status: Light Tobacco Smoker    Packs/day: 2.00    Years: 40.00    Types: Cigarettes    Last attempt to quit: 08/16/2005  . Smokeless tobacco: Never Used     Comment: Quit, now 6-7 cigs per day.  . Alcohol use No     Comment: 12 cans beer per day, quit 01/2015  . Drug use: No     Comment: 03/2015 quit 20 yrs ago  . Sexual activity: Yes   Other Topics Concern  . Not on file   Social History Narrative   Lives at home with wife   Caffeine use- sodas 2 a day    Current Outpatient Prescriptions on File Prior to Visit  Medication Sig Dispense Refill  . folic acid (FOLVITE) 025 MCG tablet Take 400 mcg by mouth daily.    Marland Kitchen lactulose (CHRONULAC) 10 GM/15ML solution Take 45 mLs (30 g total) by mouth 3 (three) times daily. (Patient taking differently: Take 20 g by mouth 2 (two) times daily. 6 tablespoons daily) 946 mL 0  . LYRICA 150 MG capsule Take by mouth 3 (three) times daily.     . QUEtiapine (SEROQUEL) 50 MG tablet Take 100-150  mg by mouth 3 (three) times daily. 100 mg in the morning, 100 mg in the afternoon, and 150 mg at bedtime.    Marland Kitchen spironolactone (ALDACTONE) 100 MG tablet Take 1 tablet (100 mg total) by mouth daily. (Patient taking differently: Take 50 mg by mouth daily. ) 30 tablet 0  . thiamine 100 MG tablet Take 1 tablet (100 mg total) by mouth daily. 30 tablet 0  . traZODone (DESYREL) 50 MG tablet Take 100 mg by mouth at bedtime.    . Artificial Tear Ointment (DRY EYES OP) Apply 2 drops to eye daily as needed (dry eyes).    . diphenhydrAMINE (BENADRYL) 25 MG tablet Take 50 mg by mouth every 6 (six) hours as needed for allergies.    . Omega-3 Fatty Acids (FISH OIL PO) Take 2 capsules by mouth daily.    . polyvinyl alcohol (LIQUIFILM TEARS) 1.4 % ophthalmic solution Place 1-2 drops into both eyes 4 (four)  times daily as needed for dry eyes.     Current Facility-Administered Medications on File Prior to Visit  Medication Dose Route Frequency Provider Last Rate Last Dose  . tamoxifen (NOLVADEX) tablet 10 mg  10 mg Oral Daily Renato Shin, MD        Allergies  Allergen Reactions  . Aspirin Other (See Comments)    Has upset stomach in high doses only. Took 4 aspirin with EMS, no reaction.    Family History  Problem Relation Age of Onset  . Hypertension Father   . Cancer Mother 33       lung  . Stroke Mother   . Other Neg Hx        gynecomastia    BP 122/84   Pulse 60   Wt 204 lb (92.5 kg)   SpO2 94%   BMI 31.95 kg/m    Review of Systems Denies leg edema    Objective:   Physical Exam VITAL SIGNS:  See vs page GENERAL: no distress BREASTS: mild bilat pseudogynecomastia, but no palpable gynecomastia.   Lab Results  Component Value Date   TSH 0.32 (L) 12/02/2016      Assessment & Plan:  Gynecomastia, improved on rx.  Ok for a trial off it Abnormal TSH, new, uncertain etiology.  Recheck next month  Patient Instructions  Please try going odd the tamoxifen medication.  blood tests are requested for you today.  We'll let you know about the results.  Weight loss helps the breast pain, also.  I would be happy to see you back here as needed.

## 2016-12-02 NOTE — Patient Instructions (Addendum)
Please try going odd the tamoxifen medication.  blood tests are requested for you today.  We'll let you know about the results.  Weight loss helps the breast pain, also.  I would be happy to see you back here as needed.

## 2016-12-03 LAB — TESTOSTERONE,FREE AND TOTAL
TESTOSTERONE: 604 ng/dL (ref 264–916)
Testosterone, Free: 3.4 pg/mL — ABNORMAL LOW (ref 6.6–18.1)

## 2016-12-04 DIAGNOSIS — R7989 Other specified abnormal findings of blood chemistry: Secondary | ICD-10-CM | POA: Insufficient documentation

## 2016-12-31 ENCOUNTER — Ambulatory Visit: Payer: Self-pay | Admitting: Endocrinology

## 2017-01-12 ENCOUNTER — Encounter: Payer: Self-pay | Admitting: Endocrinology

## 2017-01-12 ENCOUNTER — Other Ambulatory Visit: Payer: Self-pay

## 2017-01-13 ENCOUNTER — Other Ambulatory Visit: Payer: Self-pay

## 2017-01-14 ENCOUNTER — Other Ambulatory Visit: Payer: Self-pay | Admitting: Gastroenterology

## 2017-01-14 DIAGNOSIS — K7469 Other cirrhosis of liver: Secondary | ICD-10-CM

## 2017-01-20 ENCOUNTER — Telehealth: Payer: Self-pay | Admitting: Endocrinology

## 2017-01-20 NOTE — Telephone Encounter (Signed)
I called and spoke with patient's wife. He does want to be seen her for thyroid so I made him appt for 11/26.

## 2017-01-20 NOTE — Telephone Encounter (Signed)
please call patient: We received blood test results.  Your thyroid is slightly overactive.  We would be happy to see you for this here, if you want.

## 2017-01-24 ENCOUNTER — Other Ambulatory Visit: Payer: Self-pay

## 2017-02-07 ENCOUNTER — Ambulatory Visit: Payer: Self-pay | Admitting: Endocrinology

## 2017-03-11 ENCOUNTER — Ambulatory Visit
Admission: RE | Admit: 2017-03-11 | Discharge: 2017-03-11 | Disposition: A | Discharge status: Home / Self Care | Payer: Medicare Other | Source: Ambulatory Visit | Attending: Gastroenterology | Admitting: Gastroenterology

## 2017-03-11 DIAGNOSIS — K7469 Other cirrhosis of liver: Secondary | ICD-10-CM

## 2017-05-10 ENCOUNTER — Encounter: Payer: Self-pay | Admitting: Gastroenterology

## 2017-06-16 ENCOUNTER — Ambulatory Visit (INDEPENDENT_AMBULATORY_CARE_PROVIDER_SITE_OTHER): Payer: Medicare Other

## 2017-06-16 ENCOUNTER — Ambulatory Visit: Payer: Medicare Other | Admitting: Podiatry

## 2017-06-16 ENCOUNTER — Encounter: Payer: Self-pay | Admitting: Podiatry

## 2017-06-16 DIAGNOSIS — S92302A Fracture of unspecified metatarsal bone(s), left foot, initial encounter for closed fracture: Secondary | ICD-10-CM

## 2017-06-16 DIAGNOSIS — M2040 Other hammer toe(s) (acquired), unspecified foot: Secondary | ICD-10-CM

## 2017-06-23 NOTE — Progress Notes (Signed)
Subjective:   Patient ID: [name], male   DOB: 62 y.o.   MRN: [id number]   HPI Patient presents stating that he is got digital deformities on both feet and that he tripped and fell on his left foot and he has a lot of swelling and pain on the outside and it is hard for him to bear weight on it.  Stated that just happened several days ago   Review of Systems  All other systems reviewed and are negative.       Objective:  Physical Exam  Constitutional: He appears well-developed and well-nourished.  Cardiovascular: Intact distal pulses.  Pulmonary/Chest: Effort normal.  Musculoskeletal: Normal range of motion.  Neurological: He is alert.  Skin: Skin is warm.  Nursing note and vitals reviewed.   Neurovascular status intact muscle strength adequate range of motion within normal limits with patient found to have exquisite discomfort in the outside of the left foot at the base of the fifth metatarsal and digital deformities bilateral with hammertoe-like structural deformity.     Assessment:  Possibility for fracture base of fifth metatarsal left with digital deformity bilateral     Plan:  H&P conditions reviewed and recommended a walking boot for the left with aggressive ice therapy and that hopefully this will he will not require surgery at which I educated him today.  Do not recommend any procedures currently for the digital deformities until we can focus on the fracture that is acute in nature  X-rays indicate digital deformities and do indicate a fracture base of the fifth metatarsal left of a avulsion type

## 2017-07-18 ENCOUNTER — Ambulatory Visit (INDEPENDENT_AMBULATORY_CARE_PROVIDER_SITE_OTHER): Payer: Medicare Other

## 2017-07-18 ENCOUNTER — Ambulatory Visit: Payer: Medicare Other | Admitting: Podiatry

## 2017-07-18 ENCOUNTER — Encounter: Payer: Self-pay | Admitting: Podiatry

## 2017-07-18 DIAGNOSIS — S92302D Fracture of unspecified metatarsal bone(s), left foot, subsequent encounter for fracture with routine healing: Secondary | ICD-10-CM

## 2017-07-20 NOTE — Progress Notes (Signed)
Subjective:   Patient ID: [name], male   DOB: 62 y.o.   MRN: [id number]   HPI Patient states foot seems to be somewhat better but does still have some discomfort if he is doing a lot of walking   ROS      Objective:  Physical Exam  Neurovascular status intact with patient's left foot still showing some edema #but improved from previous with discomfort still upon deep palpation     Assessment:  Patient does have a fracture of the base of the fifth metatarsal left which appears to be healing clinically with soreness still present which is normal and patient is wearing the boot     Plan:  H&P condition reviewed and at this time I recommended aggressive ice therapy gradual reduction in the boot and I want to see back in 4 weeks explaining there still is healing to occur.  Patient will be seen back earlier if necessary  X-ray indicates that there is healing still to go base of fifth metatarsal left and is difficult to say whether this will heal completely

## 2017-09-22 ENCOUNTER — Other Ambulatory Visit: Payer: Self-pay | Admitting: Gastroenterology

## 2017-09-22 DIAGNOSIS — K7460 Unspecified cirrhosis of liver: Secondary | ICD-10-CM

## 2017-09-29 ENCOUNTER — Ambulatory Visit
Admission: RE | Admit: 2017-09-29 | Discharge: 2017-09-29 | Disposition: A | Discharge status: Home / Self Care | Payer: Medicare Other | Source: Ambulatory Visit | Attending: Gastroenterology | Admitting: Gastroenterology

## 2017-09-29 DIAGNOSIS — K7460 Unspecified cirrhosis of liver: Secondary | ICD-10-CM

## 2017-12-02 ENCOUNTER — Emergency Department (HOSPITAL_COMMUNITY): Payer: Medicare Other

## 2017-12-02 ENCOUNTER — Encounter (HOSPITAL_COMMUNITY): Payer: Self-pay

## 2017-12-02 ENCOUNTER — Other Ambulatory Visit: Payer: Self-pay

## 2017-12-02 ENCOUNTER — Emergency Department (HOSPITAL_COMMUNITY)
Admission: EM | Admit: 2017-12-02 | Discharge: 2017-12-03 | Disposition: A | Discharge status: Home / Self Care | Payer: Medicare Other | Attending: Emergency Medicine | Admitting: Emergency Medicine

## 2017-12-02 DIAGNOSIS — D649 Anemia, unspecified: Secondary | ICD-10-CM | POA: Insufficient documentation

## 2017-12-02 DIAGNOSIS — R42 Dizziness and giddiness: Secondary | ICD-10-CM | POA: Diagnosis present

## 2017-12-02 DIAGNOSIS — Y999 Unspecified external cause status: Secondary | ICD-10-CM | POA: Insufficient documentation

## 2017-12-02 DIAGNOSIS — K7469 Other cirrhosis of liver: Secondary | ICD-10-CM | POA: Insufficient documentation

## 2017-12-02 DIAGNOSIS — Y92018 Other place in single-family (private) house as the place of occurrence of the external cause: Secondary | ICD-10-CM | POA: Insufficient documentation

## 2017-12-02 DIAGNOSIS — W19XXXA Unspecified fall, initial encounter: Secondary | ICD-10-CM | POA: Insufficient documentation

## 2017-12-02 DIAGNOSIS — Y9389 Activity, other specified: Secondary | ICD-10-CM | POA: Diagnosis not present

## 2017-12-02 DIAGNOSIS — F17210 Nicotine dependence, cigarettes, uncomplicated: Secondary | ICD-10-CM | POA: Diagnosis not present

## 2017-12-02 DIAGNOSIS — Z79899 Other long term (current) drug therapy: Secondary | ICD-10-CM | POA: Diagnosis not present

## 2017-12-02 LAB — BASIC METABOLIC PANEL
ANION GAP: 8 (ref 5–15)
BUN: 12 mg/dL (ref 8–23)
CHLORIDE: 102 mmol/L (ref 98–111)
CO2: 30 mmol/L (ref 22–32)
Calcium: 9.6 mg/dL (ref 8.9–10.3)
Creatinine, Ser: 1.12 mg/dL (ref 0.61–1.24)
GFR calc Af Amer: >60 mL/min (ref >60)
Glucose, Bld: 106 mg/dL — ABNORMAL HIGH (ref 70–99)
POTASSIUM: 5 mmol/L (ref 3.5–5.1)
SODIUM: 140 mmol/L (ref 135–145)

## 2017-12-02 LAB — URINALYSIS, ROUTINE W REFLEX MICROSCOPIC
Bilirubin Urine: NEGATIVE
GLUCOSE, UA: NEGATIVE mg/dL
Hgb urine dipstick: NEGATIVE
KETONES UR: NEGATIVE mg/dL
LEUKOCYTES UA: NEGATIVE
NITRITE: NEGATIVE
PH: 6 (ref 5.0–8.0)
Protein, ur: NEGATIVE mg/dL
SPECIFIC GRAVITY, URINE: 1.011 (ref 1.005–1.030)

## 2017-12-02 LAB — CBC
HEMATOCRIT: 37.1 % — AB (ref 39.0–52.0)
HEMOGLOBIN: 11.9 g/dL — AB (ref 13.0–17.0)
MCH: 32.6 pg (ref 26.0–34.0)
MCHC: 32.1 g/dL (ref 30.0–36.0)
MCV: 101.6 fL — AB (ref 78.0–100.0)
Platelets: 50 10*3/uL — ABNORMAL LOW (ref 150–400)
RBC: 3.65 MIL/uL — AB (ref 4.22–5.81)
RDW: 13.4 % (ref 11.5–15.5)
WBC: 5.3 10*3/uL (ref 4.0–10.5)

## 2017-12-02 LAB — I-STAT TROPONIN, ED: Troponin i, poc: 0 ng/mL (ref 0.00–0.08)

## 2017-12-02 LAB — PROTIME-INR
INR: 1.09
Prothrombin Time: 14 seconds (ref 11.4–15.2)

## 2017-12-02 LAB — HEPATIC FUNCTION PANEL
ALBUMIN: 3.9 g/dL (ref 3.5–5.0)
ALK PHOS: 59 U/L (ref 38–126)
ALT: 25 U/L (ref 0–44)
AST: 44 U/L — AB (ref 15–41)
BILIRUBIN INDIRECT: 0.7 mg/dL (ref 0.3–0.9)
Bilirubin, Direct: 0.1 mg/dL (ref 0.0–0.2)
TOTAL PROTEIN: 7.7 g/dL (ref 6.5–8.1)
Total Bilirubin: 0.8 mg/dL (ref 0.3–1.2)

## 2017-12-02 LAB — AMMONIA: Ammonia: 41 umol/L — ABNORMAL HIGH (ref 9–35)

## 2017-12-02 MED ORDER — LORAZEPAM 1 MG PO TABS
1.0000 mg | ORAL_TABLET | ORAL | Status: DC | PRN
  Administered 2017-12-02: 1 mg via ORAL
  Filled 2017-12-02: qty 1

## 2017-12-02 NOTE — ED Notes (Signed)
edp Reese states don't need etoh blood test.

## 2017-12-02 NOTE — ED Triage Notes (Signed)
Pt has had multiple falls today, 3 times, unwitnessed. Pt denies being on blood thinner, denies LOC. LSN last night 10 pm. No weakness noted in triage.

## 2017-12-02 NOTE — Discharge Instructions (Signed)
Try cutting her Lyrica to 100 mg 3 times a day.  This may be causing her symptoms.  Please follow-up with your neurologist or the referred neurology in the paperwork for further evaluation.  Return to emergency department for any worsening falls, dizziness, chest pain, difficulty breathing or any other worsening or concerning symptoms.

## 2017-12-02 NOTE — ED Provider Notes (Signed)
[facility] DEPART[facility] Provider Note   CSN: [id number] Arrival date & time: 12/02/17  1405     History   Chief Complaint Chief Complaint  Patient presents with  . Fall    HPI [name] is a 62 y.o. male possible history of anemia, anxiety, arthritis, cirrhosis, GERD, pancreatitis who presents for evaluation of repetitive falls that is been ongoing since this morning.  Patient reports that this morning when he was getting out of bed, he was getting up from a sitting position when he fell.  He states he did not have any preceding chest pain or dizziness.  He states that he did not hit his head or lose consciousness.  He was able to get back up and walk.  Patient reports that he had been downstairs and states that he was standing several hours later and fell.  He reports no preceding chest pain or dizziness prior to fall.  No head injury or LOC with that fall either.  Patient had one additional fall this afternoon.  He states that since this morning, he has felt "kind of woozy, dizzy and off-balance."  He feels like his gait is more unsteady than normal.  Denies a room spinning sensation.  He states that he normally ambulates without the assistance of a cane or walker.  He is not currently on blood thinners.  Wife has noticed he has been having some increased urinary frequency today.  He denies any dysuria, hematuria.  Patient denies any vision changes, chest pain, difficulty breathing, numbness/weakness of his arms or legs, nausea/vomiting, fevers.  The history is provided by the patient.    Past Medical History:  Diagnosis Date  . Anemia   . Anxiety   . Arthritis 08-16-12   generalized arthritis  . Cirrhosis ([place])   . Cirrhosis of liver ([place])   . Clotting disorder ([place])   . 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  Marland Kitchen GERD (gastroesophageal reflux disease)   . History of ETOH abuse 08-16-12   Heavy alcohol use daily  .  Pancreatitis 08-16-12   at present weakness,fatiques easily  . Platelets decreased ([place])    Was told by Dr. Areta Haber a little low"-not sure how low per spouse -Reino Bellis  . Substance abuse ([place])   . Transfusion history 08-16-12   '99  . Wernicke encephalopathy     Patient Active Problem List   Diagnosis Date Noted  . Abnormal TSH 12/04/2016  . Eczema 10/21/2016  . Mixed emotional features as adjustment reaction 02/18/2016  . Screening for malignant neoplasm of prostate 09/30/2015  . Gynecomastia 09/30/2015  . Cervical post-laminectomy syndrome 09/11/2015  . Spondylolisthesis of cervical region 09/11/2015  . Lesion of liver 07/02/2015  . Altered mental state 03/28/2015  . Acute encephalopathy 03/28/2015  . Delirium 03/28/2015  . Alcohol use disorder, severe, dependence ([place]) 02/16/2015  . Alcohol use with intoxication delirium ([place]) 02/16/2015  . 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 Encephalopathy 02/12/2015  . Hyponatremia 02/12/2015  . AKI (acute kidney injury) ([place]) 02/12/2015  . Alcoholic cirrhosis of liver with ascites ([place])   . Acute blood loss anemia   . Upper GI bleed   . Thrombocytopenia ([place]) 10/10/2014  . GI bleed 10/10/2014  . Cirrhosis of liver ([place]) 10/10/2014  . Pulmonary edema 10/10/2014  . Pancytopenia ([place]) 10/10/2014  . Cellulitis 07/29/2014  . Scrotal swelling 07/29/2014  . Anemia 04/12/2008  . Alcohol abuse 04/12/2008  .  GERD 04/12/2008  . PANCREATITIS 04/12/2008  . Blood in stool 04/12/2008  . Other malaise and fatigue 04/12/2008  . HEMOPTYSIS 04/12/2008  . NAUSEA AND VOMITING 04/12/2008  . EPIGASTRIC PAIN 04/12/2008  . ALCOHOLIC HEPATITIS, HX OF 04/12/2008    Past Surgical History:  Procedure Laterality Date  . COLONOSCOPY    . ESOPHAGOGASTRODUODENOSCOPY    . ESOPHAGOGASTRODUODENOSCOPY (EGD) WITH PROPOFOL N/A 02/11/2016   Procedure: ESOPHAGOGASTRODUODENOSCOPY (EGD) WITH PROPOFOL;  Surgeon: Arta Silence, MD;  Location: [facility];  Service: Endoscopy;  Laterality: N/A;  . EUS N/A 08/18/2012   Procedure: UPPER ENDOSCOPIC ULTRASOUND (EUS) LINEAR;  Surgeon: Beryle Beams, MD;  Location: [facility];  Service: Endoscopy;  Laterality: N/A;  . INSERTION OF MESH N/A 06/18/2015   Procedure: INSERTION OF MESH;  Surgeon: Aviva Signs, MD;  Location: [facility];  Service: General;  Laterality: N/A;  . NECK SURGERY     fusion of neck-titanium hardware inplaced  . NECK SURGERY  [id number]  . UMBILICAL HERNIA REPAIR N/A 06/18/2015   Procedure: HERNIA REPAIR UMBILICAL ADULT WITH MESH;  Surgeon: Aviva Signs, MD;  Location: [facility];  Service: General;  Laterality: N/A;        Home Medications    Prior to Admission medications   Medication Sig Start Date End Date Taking? Authorizing Provider  citalopram (CELEXA) 40 MG tablet Take 40 mg by mouth daily.  05/22/17  Yes [provider]  folic acid (FOLVITE) 027 MCG tablet Take 400 mcg by mouth daily.   Yes [provider]  lactulose (CHRONULAC) 10 GM/15ML solution Take 45 mLs (30 g total) by mouth 3 (three) times daily. 04/03/15  Yes Ghimire, Henreitta Leber, MD  LYRICA 150 MG capsule Take 150 mg by mouth 3 (three) times daily.  01/16/16  Yes [provider]  QUEtiapine (SEROQUEL) 50 MG tablet Take 50 mg by mouth 3 (three) times daily.  01/12/16  Yes [provider]  spironolactone (ALDACTONE) 100 MG tablet Take 1 tablet (100 mg total) by mouth daily. Patient taking differently: Take 50 mg by mouth daily.  04/03/15  Yes Ghimire, Henreitta Leber, MD  thiamine 100 MG tablet Take 1 tablet (100 mg total) by mouth daily. 04/03/15  Yes Ghimire, Henreitta Leber, MD  traZODone (DESYREL) 50 MG tablet Take 100 mg by mouth at bedtime. 05/13/15  Yes [provider]  vitamin B-12 (CYANOCOBALAMIN) 500 MCG tablet Take 1,000 mcg by mouth daily.   Yes [provider]    Family History Family History  Problem Relation Age of Onset  .  Hypertension Father   . Cancer Mother 32       lung  . Stroke Mother   . Other Neg Hx        gynecomastia    Social History Social History   Tobacco Use  . Smoking status: Current Every Day Smoker    Packs/day: 1.00    Years: 40.00    Pack years: 40.00    Types: Cigarettes  . Smokeless tobacco: Never Used  . Tobacco comment: Quit, now 6-7 cigs per day.  Substance Use Topics  . Alcohol use: No    Comment: 12 cans beer per day, quit 01/2015  . Drug use: No    Comment: 03/2015 quit 20 yrs ago     Allergies   Aspirin   Review of Systems Review of Systems  Constitutional: Negative for fever.  Eyes: Negative for visual disturbance.  Respiratory: Negative for cough and shortness of breath.  Cardiovascular: Negative for chest pain.  Gastrointestinal: Negative for abdominal pain, nausea and vomiting.  Genitourinary: Negative for dysuria and hematuria.  Neurological: Positive for dizziness and light-headedness. Negative for weakness, numbness and headaches.  All other systems reviewed and are negative.    Physical Exam Updated Vital Signs BP 128/87   Pulse 86   Temp 99.1 F (37.3 C) (Oral)   Resp 14   Ht 5\' 8"  (1.727 m)   Wt 90.7 kg   SpO2 90%   BMI 30.41 kg/m   Physical Exam  Constitutional: He is oriented to person, place, and time. He appears well-developed and well-nourished.  HENT:  Head: Normocephalic and atraumatic.  Mouth/Throat: Oropharynx is clear and moist and mucous membranes are normal.  Eyes: Pupils are equal, round, and reactive to light. Conjunctivae, EOM and lids are normal. Right eye exhibits no nystagmus. Left eye exhibits no nystagmus.  EOMs intact without any difficulty.  No evidence of nystagmus.  Neck: Full passive range of motion without pain.  Cardiovascular: Normal rate, regular rhythm, normal heart sounds and normal pulses. Exam reveals no gallop and no friction rub.  No murmur heard. Pulmonary/Chest: Effort normal and breath sounds  normal.  Abdominal: Soft. Normal appearance. There is no tenderness. There is no rigidity and no guarding.  Musculoskeletal: Normal range of motion.  Neurological: He is alert and oriented to person, place, and time.  Cranial nerves III-XII intact Follows commands, Moves all extremities  5/5 strength to BUE and BLE  Sensation intact throughout all major nerve distributions Normal finger to nose. No dysdiadochokinesia. No pronator drift. No gait abnormalities  No slurred speech. No facial droop.   Skin: Skin is warm and dry. Capillary refill takes less than 2 seconds.  Psychiatric: He has a normal mood and affect. His speech is normal.  Nursing note and vitals reviewed.    ED Treatments / Results  Labs (all labs ordered are listed, but only abnormal results are displayed) Labs Reviewed  BASIC METABOLIC PANEL - Abnormal; Notable for the following components:      Result Value   Glucose, Bld 106 (*)    All other components within normal limits  CBC - Abnormal; Notable for the following components:   RBC 3.65 (*)    Hemoglobin 11.9 (*)    HCT 37.1 (*)    MCV 101.6 (*)    Platelets 50 (*)    All other components within normal limits  HEPATIC FUNCTION PANEL - Abnormal; Notable for the following components:   AST 44 (*)    All other components within normal limits  AMMONIA - Abnormal; Notable for the following components:   Ammonia 41 (*)    All other components within normal limits  URINALYSIS, ROUTINE W REFLEX MICROSCOPIC  PROTIME-INR  CBG MONITORING, ED  I-STAT TROPONIN, ED    EKG EKG Interpretation  Date/Time:  Friday December 02 2017 18:10:34 EDT Ventricular Rate:  68 PR Interval:    QRS Duration: 128 QT Interval:  436 QTC Calculation: 464 R Axis:   86 Text Interpretation:  Sinus rhythm Right bundle branch block Baseline wander in lead(s) III Confirmed by Quintella Reichert [id number] on 12/02/2017 8:31:36 PM   Radiology Dg Chest 2 View  Result Date:  12/02/2017 CLINICAL DATA:  Multiple falls EXAM: CHEST - 2 VIEW COMPARISON:  03/27/2015 FINDINGS: Partially visualized hardware in the cervical spine. No focal opacity or pleural effusion. Normal cardiomediastinal silhouette. No pneumothorax. Chronic lower thoracic vertebral wedging, mild. IMPRESSION: No active cardiopulmonary disease.  Electronically Signed   By: Donavan Foil M.D.   On: 12/02/2017 19:26   Ct Head Wo Contrast  Result Date: 12/02/2017 CLINICAL DATA:  Multiple falls today unwitnessed. Patient has peripheral episodic vertigo. EXAM: CT HEAD WITHOUT CONTRAST TECHNIQUE: Contiguous axial images were obtained from the base of the skull through the vertex without intravenous contrast. COMPARISON:  02/16/2015 CT FINDINGS: Brain: Chronic minimal small vessel ischemic disease of periventricular white matter. No acute intracranial hemorrhage, large vascular territory infarct, intra-axial mass nor extra-axial fluid collections. Mild age related involutional changes of brain with sulcal and mild ventricular prominence. Brainstem and cerebellum are nonacute. Vascular: Hyperdense vessel sign. Skull: No skull fracture. Sinuses/Orbits: No acute finding. Other: None. IMPRESSION: No acute intracranial findings. Mild atrophy and chronic small vessel ischemic disease, unchanged in appearance. Electronically Signed   By: Ashley Royalty M.D.   On: 12/02/2017 19:15   Mr Brain Wo Contrast  Result Date: 12/02/2017 CLINICAL DATA:  62 y/o  M; multiple unwitnessed falls today. EXAM: MRI HEAD WITHOUT CONTRAST TECHNIQUE: Multiplanar, multiecho pulse sequences of the brain and surrounding structures were obtained without intravenous contrast. COMPARISON:  12/12/2017 CT of the head.  03/20/2015 MRI head. FINDINGS: Brain: Portions of the anterior frontal lobes and anterior temporal lobes are obscured by susceptibility artifact from BB on both SWI and DWI sequences. No acute infarction, hemorrhage, hydrocephalus, extra-axial  collection or mass lesion is identified within the visible brain. Few stable nonspecific foci of T2 FLAIR hyperintensity in white matter are compatible with mild chronic microvascular ischemic changes and there is mild stable volume loss of the brain. Vascular: Normal flow voids. Skull and upper cervical spine: Normal marrow signal. Sinuses/Orbits: Negative. 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 IMPRESSION: 1. Portions of the anterior frontal lobes and anterior temporal lobes are obscured by susceptibility artifact from retained BB on several sequences. 2. No acute intracranial abnormality identified. 3. Stable mild chronic microvascular ischemic changes and volume loss of the brain. Electronically Signed   By: Kristine Garbe M.D.   On: 12/02/2017 22:18    Procedures Procedures (including critical care time)  Medications Ordered in ED Medications  LORazepam (ATIVAN) tablet 1 mg (1 mg Oral Given 12/02/17 2112)     Initial Impression / Assessment and Plan / ED Course  I have reviewed the triage vital signs and the nursing notes.  Pertinent labs & imaging results that were available during my care of the patient were reviewed by me and considered in my medical decision making (see chart for details).     62 year old male past medical history of anemia, anxiety, arthritis, cirrhosis, GERD, pancreatitis who presents for evaluation of repetitive falls that is been ongoing since this morning.  Reports feeling "off balance."  No preceding chest pain, dizziness. Patient is afebrile, non-toxic appearing, sitting comfortably on examination table. Vital signs reviewed and stable.  No neuro deficits noted on exam.  Consider infectious etiology versus lateral and imbalance.  Also consider CVA given complaints.  History/physical exam not concerning for vertigo as patient has no nausea and exhibits no nystagmus on exam.  UA negative for any acute  infectious etiology.  CBC shows hemoglobin is 11.9, hematocrit 37.1.  Troponin is negative.  BNP unremarkable.  CT head unremarkable.  Discussed patient with Dr. Leonel Ramsay (Neuro). Recommends obtaining MRI for evaluation of acute CVA. Additionally recommends looking for metabolic cause.   LFTs are unremarkable.  Ammonia is 41.  Review of right patient's records show that that usually  where he lives.  Previously had been in the 100s.  PT/INR is unremarkable.  Chest x-ray unremarkable.  Orthostatic VS for the past 24 hrs:  BP- Lying Pulse- Lying BP- Sitting Pulse- Sitting BP- Standing at 0 minutes Pulse- Standing at 0 minutes  12/02/17 1834 97/66 68 130/73 82 125/76 80    MRI is markable.  Discussed results with patient.  He is resting comfortably on table.  No complaints at this time.  Discussed with Dr. Leonel Ramsay (neuro).  Reassured by normal MRI.  Recommends cutting Lyrica from 150 mg 3 times daily to 100 3 times daily to see if that helps his symptoms.  Encouraged follow-up with patient's neurologist.  Updated patient on plan.  He is agreeable.  Patient stable for discharge at this time. Patient had ample opportunity for questions and discussion. All patient's questions were answered with full understanding. Strict return precautions discussed. Patient expresses understanding and agreement to plan.     Final Clinical Impressions(s) / ED Diagnoses   Final diagnoses:  Fall, initial encounter  Dizziness    ED Discharge Orders    None       Desma Mcgregor 12/03/17 0055    Quintella Reichert, MD 12/03/17 [id number]

## 2017-12-03 ENCOUNTER — Other Ambulatory Visit: Payer: Self-pay

## 2018-03-22 ENCOUNTER — Ambulatory Visit: Payer: Medicare Other | Admitting: Diagnostic Neuroimaging

## 2018-03-22 ENCOUNTER — Encounter

## 2018-03-22 ENCOUNTER — Encounter: Payer: Self-pay | Admitting: Diagnostic Neuroimaging

## 2018-03-22 VITALS — BP 107/70 | HR 71 | Ht 68.0 in | Wt 173.6 lb

## 2018-03-22 DIAGNOSIS — R42 Dizziness and giddiness: Secondary | ICD-10-CM

## 2018-03-22 NOTE — Progress Notes (Signed)
[facility]  PATIENT: [name] DOB: [birth year]  REFERRING CLINICIAN: S Ghimire HISTORY FROM: patient and wife  REASON FOR VISIT: follow up    HISTORICAL  CHIEF COMPLAINT:  Chief Complaint  Patient presents with  . Follow-up    Rm 6, wife and grandbaby  . Dizziness/ falls    lightheadedness, with nausea, meclizine helps. 12-02-17 for fall and dizzines, (when this started).     HISTORY OF PRESENT ILLNESS:   UPDATE (03/22/18, VRP): Since last visit, doing about the same. Some more balance issues and dizziness. Now on meclizine which helps at times. Not much vertigo now. Symptoms are improving. No alleviating or aggravating factors. Went to ER in Sept 2019.  UPDATE 01/27/16: Since last visit, symptoms of memory loss are stable. No new events. Now on tamoxifen for gynecomastia and mastalgia.   UPDATE 08/05/15: Since last visit, still with decline in memory. Now with sore breasts, enlarged breast tissue, low libido. Tolerating meds. Has followed up with PCP. GI eval pending. Mood improved.   UPDATE 06/24/15: Since last visit, was doing about the same, until March 2017, then gradual decline of speech, balance, memory and confusion. More withdrawn. Less activity. Less appetite and eating. Has lost 20lbs. Possibly more depression.   PRIOR HPI (04/15/15): 63 year old male with long history of alcohol abuse, here for evaluation of wernicke's encephalopathy. Patient was recently admitted to hospital from 03/27/15 until 04/03/15 for confusion, double vision and gait difficulty. He was diagnosed with suspected Wernicke's encephalopathy and treated with IV thiamine. Neurology and psychiatry to teams were consulted. Initially patient was planned to be discharged to skilled nursing facility unfortunately due to financial services patient was discharged home with home health services. Since discharge home patient is doing better not back to baseline. Patient has greater than 10 year  history of severe alcohol abuse, drinking greater than 12 beers per day. His health has been complicated by pancreatitis, anemia, thrombocytopenia, liver cirrhosis, depression and anxiety. Apparently patient stopped taking alcohol in early November 2016. In spite of this he has had several hospitalizations for confusion and metabolic disturbances.   REVIEW OF SYSTEMS: Full 14 system review of systems performed and negative except for: memory loss agitation confusion pain fatigue chills.    ALLERGIES: Allergies  Allergen Reactions  . Aspirin Other (See Comments)    Has upset stomach in high doses only. Took 4 aspirin with EMS, no reaction.    HOME MEDICATIONS: Outpatient Medications Prior to Visit  Medication Sig Dispense Refill  . citalopram (CELEXA) 40 MG tablet Take 40 mg by mouth daily.     . folic acid (FOLVITE) 361 MCG tablet Take 400 mcg by mouth daily.    Marland Kitchen lactulose (CHRONULAC) 10 GM/15ML solution Take 45 mLs (30 g total) by mouth 3 (three) times daily. 946 mL 0  . LYRICA 150 MG capsule Take 150 mg by mouth 3 (three) times daily.     . meclizine (ANTIVERT) 25 MG tablet Take 1 tabet QID as needed for dizziness    . QUEtiapine (SEROQUEL) 50 MG tablet Take 50 mg by mouth 3 (three) times daily.     Marland Kitchen spironolactone (ALDACTONE) 100 MG tablet Take 1 tablet (100 mg total) by mouth daily. (Patient taking differently: Take 50 mg by mouth daily. ) 30 tablet 0  . thiamine 100 MG tablet Take 1 tablet (100 mg total) by mouth daily. 30 tablet 0  . traZODone (DESYREL) 50 MG tablet Take 100 mg by mouth at bedtime.    Marland Kitchen  vitamin B-12 (CYANOCOBALAMIN) 500 MCG tablet Take 1,000 mcg by mouth daily.     Facility-Administered Medications Prior to Visit  Medication Dose Route Frequency Provider Last Rate Last Dose  . tamoxifen (NOLVADEX) tablet 10 mg  10 mg Oral Daily Renato Shin, MD        PAST MEDICAL HISTORY: Past Medical History:  Diagnosis Date  . Anemia   . Anxiety   . Arthritis 08-16-12     generalized arthritis  . Cirrhosis ([place])   . Cirrhosis of liver ([place])   . Clotting disorder ([place])   . 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  Marland Kitchen GERD (gastroesophageal reflux disease)   . History of ETOH abuse 08-16-12   Heavy alcohol use daily  . Pancreatitis 08-16-12   at present weakness,fatiques easily  . Platelets decreased ([place])    Was told by Dr. Areta Haber a little low"-not sure how low per spouse -Reino Bellis  . Substance abuse ([place])   . Transfusion history 08-16-12   '99  . Wernicke encephalopathy     PAST SURGICAL HISTORY: Past Surgical History:  Procedure Laterality Date  . COLONOSCOPY    . ESOPHAGOGASTRODUODENOSCOPY    . ESOPHAGOGASTRODUODENOSCOPY (EGD) WITH PROPOFOL N/A 02/11/2016   Procedure: ESOPHAGOGASTRODUODENOSCOPY (EGD) WITH PROPOFOL;  Surgeon: Arta Silence, MD;  Location: [facility];  Service: Endoscopy;  Laterality: N/A;  . EUS N/A 08/18/2012   Procedure: UPPER ENDOSCOPIC ULTRASOUND (EUS) LINEAR;  Surgeon: Beryle Beams, MD;  Location: [facility];  Service: Endoscopy;  Laterality: N/A;  . INSERTION OF MESH N/A 06/18/2015   Procedure: INSERTION OF MESH;  Surgeon: Aviva Signs, MD;  Location: [facility];  Service: General;  Laterality: N/A;  . NECK SURGERY     fusion of neck-titanium hardware inplaced  . NECK SURGERY  [phone]  . UMBILICAL HERNIA REPAIR N/A 06/18/2015   Procedure: HERNIA REPAIR UMBILICAL ADULT WITH MESH;  Surgeon: Aviva Signs, MD;  Location: [facility];  Service: General;  Laterality: N/A;    FAMILY HISTORY: Family History  Problem Relation Age of Onset  . Hypertension Father   . Cancer Mother 26       lung  . Stroke Mother   . Other Neg Hx        gynecomastia    SOCIAL HISTORY:  Social History   Socioeconomic History  . Marital status: Married    Spouse name: Harriett  . Number of children: 2  . Years of education: 9  . Highest education level: Not on file  Occupational History    Comment: disabled   Social Needs  . Financial resource strain: Not on file  . Food insecurity:    Worry: Not on file    Inability: Not on file  . Transportation needs:    Medical: Not on file    Non-medical: Not on file  Tobacco Use  . Smoking status: Current Every Day Smoker    Packs/day: 1.00    Years: 40.00    Pack years: 40.00    Types: Cigarettes  . Smokeless tobacco: Never Used  . Tobacco comment: Quit, now 6-7 cigs per day.  Substance and Sexual Activity  . Alcohol use: No    Comment: 12 cans beer per day, quit 01/2015  . Drug use: No    Comment: 03/2015 quit 20 yrs ago  . Sexual activity: Yes  Lifestyle  . Physical activity:    Days per week: Not on file    Minutes per session: Not on  file  . Stress: Not on file  Relationships  . Social connections:    Talks on phone: Not on file    Gets together: Not on file    Attends religious service: Not on file    Active member of club or organization: Not on file    Attends meetings of clubs or organizations: Not on file    Relationship status: Not on file  . Intimate partner violence:    Fear of current or ex partner: Not on file    Emotionally abused: Not on file    Physically abused: Not on file    Forced sexual activity: Not on file  Other Topics Concern  . Not on file  Social History Narrative   Lives at home with wife   Caffeine use- sodas 2 a day     PHYSICAL EXAM  GENERAL EXAM/CONSTITUTIONAL: Vitals:  Vitals:   03/22/18 1335  BP: 107/70  Pulse: 71  Weight: 173 lb 9.6 oz (78.7 kg)  Height: 5\' 8"  (1.727 m)   Body mass index is 26.4 kg/m. No exam data present  Patient is in no distress; well developed, nourished and groomed; neck is supple  CARDIOVASCULAR:  Examination of carotid arteries is normal; no carotid bruits  Regular rate and rhythm, no murmurs  Examination of peripheral vascular system by observation and palpation is normal  EYES:  Ophthalmoscopic exam of optic discs and posterior segments is  normal; no papilledema or hemorrhages  MUSCULOSKELETAL:  Gait, strength, tone, movements noted in Neurologic exam below  NEUROLOGIC: MENTAL STATUS:  No flowsheet data found.  awake, alert, oriented to person  [facility] memory   DECR attention and concentration  DECR FLUENCY  fund of knowledge appropriate  CRANIAL NERVE:   2nd, 3rd, 4th, 6th - pupils equal and reactive to light, visual fields full to confrontation, extraocular muscles intact, no nystagmus  5th - facial sensation symmetric  7th - facial strength symmetric  8th - hearing intact  9th - palate elevates symmetrically, uvula midline  11th - shoulder shrug symmetric  12th - tongue protrusion midline  MOTOR:   normal bulk and tone, full strength in the BUE, BLE  SENSORY:   normal and symmetric to light touch, temperature, vibration  COORDINATION:   finger-nose-finger, fine finger movements SLOW; MILD DYSMETRIA  NO TREMOR  NO ASTERIXIS  REFLEXES:   deep tendon reflexes TRACE and symmetric  GAIT/STATION:   narrow based gait; SLOW, CAUTIOUS    DIAGNOSTIC DATA (LABS, IMAGING, TESTING) - I reviewed patient records, labs, notes, testing and imaging myself where available.  Lab Results  Component Value Date   WBC 5.3 12/02/2017   HGB 11.9 (L) 12/02/2017   HCT 37.1 (L) 12/02/2017   MCV 101.6 (H) 12/02/2017   PLT 50 (L) 12/02/2017      Component Value Date/Time   NA 140 12/02/2017 1522   NA 136 06/24/2015 1432   K 5.0 12/02/2017 1522   CL 102 12/02/2017 1522   CO2 30 12/02/2017 1522   GLUCOSE 106 (H) 12/02/2017 1522   BUN 12 12/02/2017 1522   BUN 35 (H) 06/24/2015 1432   CREATININE 1.12 12/02/2017 1522   CALCIUM 9.6 12/02/2017 1522   PROT 7.7 12/02/2017 1522   PROT 8.7 (H) 06/24/2015 1432   ALBUMIN 3.9 12/02/2017 1522   ALBUMIN 4.3 06/24/2015 1432   AST 44 (H) 12/02/2017 1522   ALT 25 12/02/2017 1522   ALKPHOS 59 12/02/2017 1522   BILITOT 0.8 12/02/2017 1522  BILITOT 1.0  06/24/2015 1432   GFRNONAA >60 12/02/2017 1522   GFRAA >60 12/02/2017 1522   Lab Results  Component Value Date   CHOL  06/15/2007    149        ATP III CLASSIFICATION:  <200     mg/dL   Desirable  200-239  mg/dL   Borderline High  >=240    mg/dL   High   HDL 17 (L) 06/15/2007   LDLCALC  06/15/2007    98        Total Cholesterol/HDL:CHD Risk Coronary Heart Disease Risk Table                     Men   Women  1/2 Average Risk   3.4   3.3   TRIG 170 (H) 06/15/2007   CHOLHDL 8.8 06/15/2007   No results found for: HGBA1C Lab Results  Component Value Date   VITAMINB12 763 06/24/2015   Lab Results  Component Value Date   TSH 0.32 (L) 12/02/2016    03/20/15 MRI brain [I reviewed images myself and agree with interpretation. -VRP]  1. Incomplete examination with motion and magnetic susceptibility artifact as above. 2. No acute intracranial abnormality identified. 3. Mildly advanced cerebral atrophy for age.  03/29/15 EEG  - Generalized non-specific cerebral dysfunction(encephalopathy). There was no seizure or seizure predisposition recorded on this study.  12/02/17 MRI brain 1. Portions of the anterior frontal lobes and anterior temporal lobes are obscured by susceptibility artifact from retained BB on several sequences. 2. No acute intracranial abnormality identified.  3. Stable mild chronic microvascular ischemic changes and volume loss of the brain.     ASSESSMENT AND PLAN  63 y.o. year old male here with long history of alcohol abuse, with recurrent confusional episodes in setting of metabolic disturbance and suspected Wernicke's encephalopathy. Symptoms slightly improved in Jan / Feb 2017, then gradually worsening since March 2017, now stable in Nov 2017. Depression and anxiety are still an issue.  Now with more dizziness and off balance sensation. Likely sequelae of etoh abuse from the past. Continue supportive care.    Dx:  Dizziness    PLAN:  DIZZINESS / BALANCE  DIFFICULTY (sequelae of etoh abuse; wernicke's encephalopathy; etoh neuropathy; etoh cerebellar dysfunction) - reviewed importance of nutrition, physical activity, stress mgmt and sleep - consider psychiatry/psychology evaluation for depression and anxiety - consider PT evaluation  Return if symptoms worsen or fail to improve, for return to PCP.    Penni Bombard, MD 09/15/1285, 8:67 PM Certified in Neurology, Neurophysiology and Neuroimaging  [facility] [address] [place],  [address] [phone]

## 2018-04-10 ENCOUNTER — Other Ambulatory Visit: Payer: Self-pay | Admitting: Gastroenterology

## 2018-04-10 DIAGNOSIS — K7460 Unspecified cirrhosis of liver: Secondary | ICD-10-CM

## 2018-04-24 ENCOUNTER — Ambulatory Visit
Admission: RE | Admit: 2018-04-24 | Discharge: 2018-04-24 | Disposition: A | Discharge status: Home / Self Care | Payer: Medicare Other | Source: Ambulatory Visit | Attending: Gastroenterology | Admitting: Gastroenterology

## 2018-04-24 DIAGNOSIS — K7460 Unspecified cirrhosis of liver: Secondary | ICD-10-CM

## 2018-10-27 ENCOUNTER — Other Ambulatory Visit: Payer: Self-pay | Admitting: Gastroenterology

## 2018-10-27 DIAGNOSIS — K7469 Other cirrhosis of liver: Secondary | ICD-10-CM

## 2018-11-07 ENCOUNTER — Ambulatory Visit
Admission: RE | Admit: 2018-11-07 | Discharge: 2018-11-07 | Disposition: A | Discharge status: Home / Self Care | Payer: Medicare Other | Source: Ambulatory Visit | Attending: Gastroenterology | Admitting: Gastroenterology

## 2018-11-07 DIAGNOSIS — K7469 Other cirrhosis of liver: Secondary | ICD-10-CM

## 2019-05-04 ENCOUNTER — Other Ambulatory Visit: Payer: Self-pay | Admitting: Gastroenterology

## 2019-05-04 DIAGNOSIS — K7460 Unspecified cirrhosis of liver: Secondary | ICD-10-CM

## 2019-05-09 ENCOUNTER — Other Ambulatory Visit: Payer: Medicare Other

## 2019-05-14 ENCOUNTER — Ambulatory Visit
Admission: RE | Admit: 2019-05-14 | Discharge: 2019-05-14 | Disposition: A | Discharge status: Home / Self Care | Payer: Medicare Other | Source: Ambulatory Visit | Attending: Gastroenterology | Admitting: Gastroenterology

## 2019-05-14 DIAGNOSIS — K7460 Unspecified cirrhosis of liver: Secondary | ICD-10-CM

## 2019-08-02 ENCOUNTER — Other Ambulatory Visit: Payer: Self-pay | Admitting: Gastroenterology

## 2019-08-11 ENCOUNTER — Other Ambulatory Visit (HOSPITAL_COMMUNITY)
Admission: RE | Admit: 2019-08-11 | Discharge: 2019-08-11 | Disposition: A | Discharge status: Home / Self Care | Payer: Medicare Other | Source: Ambulatory Visit | Attending: Gastroenterology | Admitting: Gastroenterology

## 2019-08-11 DIAGNOSIS — Z01812 Encounter for preprocedural laboratory examination: Secondary | ICD-10-CM | POA: Diagnosis present

## 2019-08-11 DIAGNOSIS — Z20822 Contact with and (suspected) exposure to covid-19: Secondary | ICD-10-CM | POA: Diagnosis not present

## 2019-08-12 LAB — SARS CORONAVIRUS 2 (TAT 6-24 HRS): SARS Coronavirus 2: NEGATIVE

## 2019-08-15 ENCOUNTER — Other Ambulatory Visit: Payer: Self-pay

## 2019-08-15 ENCOUNTER — Ambulatory Visit (HOSPITAL_COMMUNITY)
Admission: RE | Admit: 2019-08-15 | Discharge: 2019-08-15 | Disposition: A | Discharge status: Home / Self Care | Payer: Medicare Other | Attending: Gastroenterology | Admitting: Gastroenterology

## 2019-08-15 ENCOUNTER — Ambulatory Visit (HOSPITAL_COMMUNITY): Payer: Medicare Other | Admitting: Anesthesiology

## 2019-08-15 ENCOUNTER — Encounter (HOSPITAL_COMMUNITY): Payer: Self-pay | Admitting: Gastroenterology

## 2019-08-15 ENCOUNTER — Encounter (HOSPITAL_COMMUNITY)
Admission: RE | Disposition: A | Discharge status: Home / Self Care | Payer: Self-pay | Source: Home / Self Care | Attending: Gastroenterology

## 2019-08-15 DIAGNOSIS — K766 Portal hypertension: Secondary | ICD-10-CM | POA: Diagnosis not present

## 2019-08-15 DIAGNOSIS — E119 Type 2 diabetes mellitus without complications: Secondary | ICD-10-CM | POA: Diagnosis not present

## 2019-08-15 DIAGNOSIS — M1990 Unspecified osteoarthritis, unspecified site: Secondary | ICD-10-CM | POA: Insufficient documentation

## 2019-08-15 DIAGNOSIS — K7460 Unspecified cirrhosis of liver: Secondary | ICD-10-CM | POA: Insufficient documentation

## 2019-08-15 DIAGNOSIS — F17210 Nicotine dependence, cigarettes, uncomplicated: Secondary | ICD-10-CM | POA: Insufficient documentation

## 2019-08-15 DIAGNOSIS — K3189 Other diseases of stomach and duodenum: Secondary | ICD-10-CM | POA: Insufficient documentation

## 2019-08-15 DIAGNOSIS — Z8546 Personal history of malignant neoplasm of prostate: Secondary | ICD-10-CM | POA: Diagnosis not present

## 2019-08-15 HISTORY — PX: ESOPHAGOGASTRODUODENOSCOPY: SHX5428

## 2019-08-15 HISTORY — DX: Type 2 diabetes mellitus without complications: E11.9

## 2019-08-15 SURGERY — EGD (ESOPHAGOGASTRODUODENOSCOPY)
Anesthesia: Monitor Anesthesia Care

## 2019-08-15 MED ORDER — LIDOCAINE HCL (CARDIAC) PF 100 MG/5ML IV SOSY
PREFILLED_SYRINGE | INTRAVENOUS | Status: DC | PRN
  Administered 2019-08-15: 100 mg via INTRAVENOUS

## 2019-08-15 MED ORDER — SODIUM CHLORIDE 0.9 % IV SOLN: INTRAVENOUS | Status: DC

## 2019-08-15 MED ORDER — PROPOFOL 10 MG/ML IV BOLUS
INTRAVENOUS | Status: DC | PRN
  Administered 2019-08-15: 20 mg via INTRAVENOUS
  Administered 2019-08-15: 100 ug/kg/min via INTRAVENOUS

## 2019-08-15 MED ORDER — SODIUM CHLORIDE 0.9% IV SOLUTION: Freq: Once | INTRAVENOUS | Status: DC

## 2019-08-15 MED ORDER — PROPOFOL 10 MG/ML IV BOLUS
INTRAVENOUS | Status: DC | PRN
  Administered 2019-08-15: 30 mg via INTRAVENOUS

## 2019-08-15 MED ORDER — LACTATED RINGERS IV SOLN: INTRAVENOUS | Status: DC

## 2019-08-15 NOTE — Discharge Instructions (Signed)

## 2019-08-15 NOTE — Progress Notes (Signed)
Pt awaiting on wife to pick up from hospital, rn and doctor unable to reach her by phone, pt spoke to her with cell phone

## 2019-08-15 NOTE — Anesthesia Postprocedure Evaluation (Signed)
Anesthesia Post Note  Patient: Hospital doctor  Procedure(s) Performed: ESOPHAGOGASTRODUODENOSCOPY (EGD) (N/A )     Patient location during evaluation: PACU Anesthesia Type: MAC Level of consciousness: awake and alert and oriented Pain management: pain level controlled Vital Signs Assessment: post-procedure vital signs reviewed and stable Respiratory status: spontaneous breathing, nonlabored ventilation and respiratory function stable Cardiovascular status: stable and blood pressure returned to baseline Postop Assessment: no apparent nausea or vomiting Anesthetic complications: no    Last Vitals:  Vitals:   08/15/19 1050 08/15/19 1055  BP: 109/69   Pulse: 61 71  Resp: 15 (!) 34  Temp:    SpO2: (!) 89% 92%    Last Pain:  Vitals:   08/15/19 1035  TempSrc: Oral  PainSc: 0-No pain                 Sritha Chauncey A.

## 2019-08-15 NOTE — Op Note (Signed)
[facility] Patient Name: Zebulan Michelena Procedure Date: 08/15/2019 MRN: [id number] Attending MD: Arta Silence , MD Date of Birth: Jan 02, 1956 CSN: [id number] Age: 64 Admit Type: Outpatient Procedure:                Upper GI endoscopy Indications:              cirrhosis Providers:                Arta Silence, MD, Cleda Daub, RN, Corie Chiquito,                            Technician Referring MD:              Medicines:                Monitored Anesthesia Care Complications:            No immediate complications. Estimated Blood Loss:     Estimated blood loss: none. Procedure:                Pre-Anesthesia Assessment:                           - Prior to the procedure, a History and Physical                            was performed, and patient medications and                            allergies were reviewed. The patient's tolerance of                            previous anesthesia was also reviewed. The risks                            and benefits of the procedure and the sedation                            options and risks were discussed with the patient.                            All questions were answered, and informed consent                            was obtained. Prior Anticoagulants: The patient has                            taken no previous anticoagulant or antiplatelet                            agents. ASA Grade Assessment: III - A patient with                            severe systemic disease. After reviewing the risks                            and benefits,  the patient was deemed in                            satisfactory condition to undergo the procedure.                           After obtaining informed consent, the endoscope was                            passed under direct vision. Throughout the                            procedure, the patient's blood pressure, pulse, and                            oxygen saturations were monitored  continuously. The                            [id number]-[id number]) Olympus gastroscope was                            introduced through the mouth, and advanced to the                            second part of duodenum. The upper GI endoscopy was                            accomplished without difficulty. The patient                            tolerated the procedure well. Scope In: Scope Out: Findings:      The examined esophagus was normal. No esophageal varices.      Mild portal hypertensive gastropathy was found in the entire examined       stomach.      The exam of the stomach was otherwise normal. No gastric varices      The duodenal bulb, first portion of the duodenum and second portion of       the duodenum were normal. Impression:               - Normal esophagus.                           - Portal hypertensive gastropathy.                           - Normal duodenal bulb, first portion of the                            duodenum and second portion of the duodenum. Moderate Sedation:      Not Applicable - Patient had care per Anesthesia. Recommendation:           - Patient has a contact number available for                            emergencies. The  signs and symptoms of potential                            delayed complications were discussed with the                            patient. Return to normal activities tomorrow.                            Written discharge instructions were provided to the                            patient.                           - Discharge patient to home (via wheelchair).                           - Resume previous diet today.                           - Continue present medications.                           - Return to GI clinic in 3 months.                           - Return to referring physician as previously                            scheduled. Procedure Code(s):        --- Professional ---                           [phone],  Esophagogastroduodenoscopy, flexible,                            transoral; diagnostic, including collection of                            specimen(s) by brushing or washing, when performed                            (separate procedure) Diagnosis Code(s):        --- Professional ---                           K76.6, Portal hypertension                           K31.89, Other diseases of stomach and duodenum CPT copyright 2019 American Medical Association. All rights reserved. The codes documented in this report are preliminary and upon coder review may  be revised to meet current compliance requirements. Arta Silence, MD 08/15/2019 10:37:42 AM This report has been signed electronically. Number of Addenda: 0

## 2019-08-15 NOTE — H&P (Signed)
Patient interval history reviewed.  Patient examined again.  There has been no change from documented H/P scanned into chart from our office except as documented below.  Assessment:  1.  Cirrhosis.  Plan:  1.  Endoscopy. 2.  Risks (bleeding, infection, bowel perforation that could require surgery, sedation-related changes in cardiopulmonary systems), benefits (identification and possible treatment of source of symptoms, exclusion of certain causes of symptoms), and alternatives (watchful waiting, radiographic imaging studies, empiric medical treatment) of upper endoscopy (EGD) were explained to patient/family in detail and patient wishes to proceed.

## 2019-08-15 NOTE — Transfer of Care (Signed)
Immediate Anesthesia Transfer of Care Note  Patient: [name]  Procedure(s) Performed: ESOPHAGOGASTRODUODENOSCOPY (EGD) (N/A )  Patient Location: PACU and Endoscopy Unit  Anesthesia Type:MAC  Level of Consciousness: drowsy  Airway & Oxygen Therapy: Patient Spontanous Breathing and Patient connected to face mask oxygen  Post-op Assessment: Report given to RN and Post -op Vital signs reviewed and stable  Post vital signs: Reviewed and stable  Last Vitals:  Vitals Value Taken Time  BP 59/42 08/15/19 1033  Temp    Pulse 65 08/15/19 1034  Resp 16 08/15/19 1034  SpO2 100 % 08/15/19 1034  Vitals shown include unvalidated device data.  Last Pain:  Vitals:   08/15/19 1009  TempSrc: Oral  PainSc:          Complications: No apparent anesthesia complications

## 2019-08-15 NOTE — Anesthesia Preprocedure Evaluation (Signed)
Anesthesia Evaluation  Patient identified by MRN, date of birth, ID band Patient awake    Reviewed: Allergy & Precautions, NPO status , Patient's Chart, lab work & pertinent test results  Airway Mallampati: II  TM Distance: >3 FB Neck ROM: Full    Dental  (+) Upper Dentures, Lower Dentures Upper dentures broken with missing teeth:   Pulmonary Current Smoker,    breath sounds clear to auscultation + decreased breath sounds      Cardiovascular negative cardio ROS Normal cardiovascular exam Rhythm:Regular Rate:Normal     Neuro/Psych PSYCHIATRIC DISORDERS Anxiety    GI/Hepatic GERD  Medicated,(+) Cirrhosis   Esophageal Varices  substance abuse  alcohol use,   Endo/Other  diabetes, Well Controlled, Type 2  Renal/GU Renal InsufficiencyRenal disease  male genitourinary complaint Hx/o Prostate Ca    Musculoskeletal  (+) Arthritis , Osteoarthritis,    Abdominal   Peds  Hematology  (+) anemia , Thrombocytopenia   Anesthesia Other Findings   Reproductive/Obstetrics                             Anesthesia Physical Anesthesia Plan  ASA: III  Anesthesia Plan: MAC   Post-op Pain Management:    Induction: Intravenous  PONV Risk Score and Plan: 0 and Ondansetron and Treatment may vary due to age or medical condition  Airway Management Planned: Natural Airway and Nasal Cannula  Additional Equipment:   Intra-op Plan:   Post-operative Plan:   Informed Consent: I have reviewed the patients History and Physical, chart, labs and discussed the procedure including the risks, benefits and alternatives for the proposed anesthesia with the patient or authorized representative who has indicated his/her understanding and acceptance.     Dental advisory given  Plan Discussed with: CRNA and Surgeon  Anesthesia Plan Comments:         Anesthesia Quick Evaluation

## 2019-08-16 LAB — PREPARE PLATELET PHERESIS: Unit division: 0

## 2019-08-16 LAB — BPAM PLATELET PHERESIS
Blood Product Expiration Date: 202106042359
ISSUE DATE / TIME: 202106020902
Unit Type and Rh: 6200

## 2019-11-12 ENCOUNTER — Other Ambulatory Visit: Payer: Self-pay

## 2019-11-12 ENCOUNTER — Emergency Department (HOSPITAL_COMMUNITY): Payer: Medicare Other

## 2019-11-12 ENCOUNTER — Emergency Department (HOSPITAL_COMMUNITY)
Admission: EM | Admit: 2019-11-12 | Discharge: 2019-11-12 | Disposition: A | Discharge status: Home / Self Care | Payer: Medicare Other | Attending: Emergency Medicine | Admitting: Emergency Medicine

## 2019-11-12 ENCOUNTER — Encounter (HOSPITAL_COMMUNITY): Payer: Self-pay | Admitting: *Deleted

## 2019-11-12 DIAGNOSIS — Y999 Unspecified external cause status: Secondary | ICD-10-CM | POA: Insufficient documentation

## 2019-11-12 DIAGNOSIS — S14109A Unspecified injury at unspecified level of cervical spinal cord, initial encounter: Secondary | ICD-10-CM | POA: Diagnosis present

## 2019-11-12 DIAGNOSIS — F17210 Nicotine dependence, cigarettes, uncomplicated: Secondary | ICD-10-CM | POA: Diagnosis not present

## 2019-11-12 DIAGNOSIS — R4182 Altered mental status, unspecified: Secondary | ICD-10-CM | POA: Insufficient documentation

## 2019-11-12 DIAGNOSIS — Y939 Activity, unspecified: Secondary | ICD-10-CM | POA: Insufficient documentation

## 2019-11-12 DIAGNOSIS — W19XXXA Unspecified fall, initial encounter: Secondary | ICD-10-CM | POA: Diagnosis not present

## 2019-11-12 DIAGNOSIS — S12100A Unspecified displaced fracture of second cervical vertebra, initial encounter for closed fracture: Secondary | ICD-10-CM | POA: Diagnosis not present

## 2019-11-12 DIAGNOSIS — E119 Type 2 diabetes mellitus without complications: Secondary | ICD-10-CM | POA: Diagnosis not present

## 2019-11-12 DIAGNOSIS — S12030A Displaced posterior arch fracture of first cervical vertebra, initial encounter for closed fracture: Secondary | ICD-10-CM | POA: Insufficient documentation

## 2019-11-12 DIAGNOSIS — Y929 Unspecified place or not applicable: Secondary | ICD-10-CM | POA: Insufficient documentation

## 2019-11-12 LAB — BASIC METABOLIC PANEL
Anion gap: 10 (ref 5–15)
BUN: 16 mg/dL (ref 8–23)
CO2: 25 mmol/L (ref 22–32)
Calcium: 9.5 mg/dL (ref 8.9–10.3)
Chloride: 94 mmol/L — ABNORMAL LOW (ref 98–111)
Creatinine, Ser: 0.82 mg/dL (ref 0.61–1.24)
GFR calc Af Amer: >60 mL/min (ref >60)
GFR calc non Af Amer: >60 mL/min (ref >60)
Glucose, Bld: 133 mg/dL — ABNORMAL HIGH (ref 70–99)
Potassium: 4.3 mmol/L (ref 3.5–5.1)
Sodium: 129 mmol/L — ABNORMAL LOW (ref 135–145)

## 2019-11-12 LAB — MAGNESIUM: Magnesium: 1.5 mg/dL — ABNORMAL LOW (ref 1.7–2.4)

## 2019-11-12 LAB — CBC WITH DIFFERENTIAL/PLATELET
Abs Immature Granulocytes: 0.01 10*3/uL (ref 0.00–0.07)
Basophils Absolute: 0 10*3/uL (ref 0.0–0.1)
Basophils Relative: 1 %
Eosinophils Absolute: 0 10*3/uL (ref 0.0–0.5)
Eosinophils Relative: 0 %
HCT: 34.9 % — ABNORMAL LOW (ref 39.0–52.0)
Hemoglobin: 12 g/dL — ABNORMAL LOW (ref 13.0–17.0)
Immature Granulocytes: 0 %
Lymphocytes Relative: 16 %
Lymphs Abs: 0.4 10*3/uL — ABNORMAL LOW (ref 0.7–4.0)
MCH: 35.6 pg — ABNORMAL HIGH (ref 26.0–34.0)
MCHC: 34.4 g/dL (ref 30.0–36.0)
MCV: 103.6 fL — ABNORMAL HIGH (ref 80.0–100.0)
Monocytes Absolute: 0.3 10*3/uL (ref 0.1–1.0)
Monocytes Relative: 11 %
Neutro Abs: 1.8 10*3/uL (ref 1.7–7.7)
Neutrophils Relative %: 72 %
Platelets: 75 10*3/uL — ABNORMAL LOW (ref 150–400)
RBC: 3.37 MIL/uL — ABNORMAL LOW (ref 4.22–5.81)
RDW: 13.3 % (ref 11.5–15.5)
WBC: 2.5 10*3/uL — ABNORMAL LOW (ref 4.0–10.5)
nRBC: 0 % (ref 0.0–0.2)

## 2019-11-12 LAB — AMMONIA: Ammonia: 13 umol/L (ref 9–35)

## 2019-11-12 LAB — HEPATIC FUNCTION PANEL
ALT: 56 U/L — ABNORMAL HIGH (ref 0–44)
AST: 85 U/L — ABNORMAL HIGH (ref 15–41)
Albumin: 4.2 g/dL (ref 3.5–5.0)
Alkaline Phosphatase: 53 U/L (ref 38–126)
Bilirubin, Direct: 0.3 mg/dL — ABNORMAL HIGH (ref 0.0–0.2)
Indirect Bilirubin: 1.1 mg/dL — ABNORMAL HIGH (ref 0.3–0.9)
Total Bilirubin: 1.4 mg/dL — ABNORMAL HIGH (ref 0.3–1.2)
Total Protein: 7.5 g/dL (ref 6.5–8.1)

## 2019-11-12 LAB — ETHANOL: Alcohol, Ethyl (B): <10 mg/dL (ref <10)

## 2019-11-12 MED ORDER — THIAMINE HCL 100 MG PO TABS
100.0000 mg | ORAL_TABLET | Freq: Once | ORAL | Status: AC
  Administered 2019-11-12: 100 mg via ORAL
  Filled 2019-11-12: qty 1

## 2019-11-12 MED ORDER — MAGNESIUM SULFATE 2 GM/50ML IV SOLN
2.0000 g | Freq: Once | INTRAVENOUS | Status: AC
  Administered 2019-11-12: 2 g via INTRAVENOUS
  Filled 2019-11-12: qty 50

## 2019-11-12 MED ORDER — HYDROCODONE-ACETAMINOPHEN 5-325 MG PO TABS
ORAL_TABLET | ORAL | 0 refills | Status: DC
Start: 2019-11-12 — End: 2019-12-10

## 2019-11-12 MED ORDER — FENTANYL CITRATE (PF) 100 MCG/2ML IJ SOLN
12.5000 ug | Freq: Once | INTRAMUSCULAR | Status: AC
  Administered 2019-11-12: 12.5 ug via INTRAVENOUS
  Filled 2019-11-12: qty 2

## 2019-11-12 NOTE — Discharge Instructions (Addendum)
Your CT scans today show you have two broken bones in the top of your spine.  It's important that you keep the neck collar on 24/7.  Call Dr. Ronnald Ramp' office to arrange a follow-up appt in one week.  Also, call your primary doctor to arrange f/u this week and continue taking your thiamine and multivitamin every day.  Return to ER for any worsening symptoms

## 2019-11-12 NOTE — ED Provider Notes (Signed)
[facility] Provider Note   CSN: [id number] Arrival date & time: 11/12/19  1058     History Chief Complaint  Patient presents with  . Head Injury    [name] is a 64 y.o. male.  HPI     [name] is a 64 y.o. male with past medical history of anemia, cirrhosis, diabetes, and alcohol abuse who presents to the Emergency Department complaining of persistent headache and neck pain secondary to a mechanical fall that occurred 4 weeks ago.  He states that he was sleeping in a chair on his porch and fell out of the chair striking the back of his head on the porch railing.  He reports loss of consciousness.  Since the fall, he reports constant, diffuse headache with multiple episodes of nausea and vomiting.  He describes the headache as a throbbing sensation from his temples and forehead that radiates into his neck.  Pain is been unrelieved by over-the-counter pain relievers.  Nothing makes the pain better or worse.  He denies dizziness, numbness or weakness of the extremities, chest pain, shortness of breath or other injuries related to his fall.  He does take aspirin daily.    Past Medical History:  Diagnosis Date  . Anemia   . Anxiety   . Arthritis 08-16-12   generalized arthritis  . Cirrhosis ([place])   . Cirrhosis of liver ([place])   . Clotting disorder ([place])   . Diabetes mellitus without complication ([place])   . 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  Marland Kitchen GERD (gastroesophageal reflux disease)   . History of ETOH abuse 08-16-12   Heavy alcohol use daily  . Pancreatitis 08-16-12   at present weakness,fatiques easily  . Platelets decreased ([place])    Was told by Dr. Areta Haber a little low"-not sure how low per spouse -Reino Bellis  . Substance abuse ([place])   . Transfusion history 08-16-12   '99  . Wernicke encephalopathy     Patient Active Problem List   Diagnosis Date Noted  . Abnormal TSH 12/04/2016  . Eczema 10/21/2016  .  Mixed emotional features as adjustment reaction 02/18/2016  . Screening for malignant neoplasm of prostate 09/30/2015  . Gynecomastia 09/30/2015  . Cervical post-laminectomy syndrome 09/11/2015  . Spondylolisthesis of cervical region 09/11/2015  . Lesion of liver 07/02/2015  . Altered mental state 03/28/2015  . Acute encephalopathy 03/28/2015  . Delirium 03/28/2015  . Alcohol use disorder, severe, dependence ([place]) 02/16/2015  . Alcohol use with intoxication delirium ([place]) 02/16/2015  . 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 Encephalopathy 02/12/2015  . Hyponatremia 02/12/2015  . AKI (acute kidney injury) ([place]) 02/12/2015  . Alcoholic cirrhosis of liver with ascites ([place])   . Acute blood loss anemia   . Upper GI bleed   . Thrombocytopenia ([place]) 10/10/2014  . GI bleed 10/10/2014  . Cirrhosis of liver ([place]) 10/10/2014  . Pulmonary edema 10/10/2014  . Pancytopenia ([place]) 10/10/2014  . Cellulitis 07/29/2014  . Scrotal swelling 07/29/2014  . Anemia 04/12/2008  . Alcohol abuse 04/12/2008  . GERD 04/12/2008  . PANCREATITIS 04/12/2008  . Blood in stool 04/12/2008  . Other malaise and fatigue 04/12/2008  . HEMOPTYSIS 04/12/2008  . NAUSEA AND VOMITING 04/12/2008  . EPIGASTRIC PAIN 04/12/2008  . ALCOHOLIC HEPATITIS, HX OF 04/12/2008    Past Surgical History:  Procedure Laterality Date  . COLONOSCOPY    . ESOPHAGOGASTRODUODENOSCOPY    . ESOPHAGOGASTRODUODENOSCOPY N/A 08/15/2019   Procedure:  ESOPHAGOGASTRODUODENOSCOPY (EGD);  Surgeon: Arta Silence, MD;  Location: [facility];  Service: Endoscopy;  Laterality: N/A;  Pt will need 1 bag of pheresed platelets pre-procedure  . ESOPHAGOGASTRODUODENOSCOPY (EGD) WITH PROPOFOL N/A 02/11/2016   Procedure: ESOPHAGOGASTRODUODENOSCOPY (EGD) WITH PROPOFOL;  Surgeon: Arta Silence, MD;  Location: [facility];  Service: Endoscopy;  Laterality: N/A;  . EUS N/A 08/18/2012   Procedure: UPPER ENDOSCOPIC ULTRASOUND (EUS)  LINEAR;  Surgeon: Beryle Beams, MD;  Location: [facility];  Service: Endoscopy;  Laterality: N/A;  . INSERTION OF MESH N/A 06/18/2015   Procedure: INSERTION OF MESH;  Surgeon: Aviva Signs, MD;  Location: [facility];  Service: General;  Laterality: N/A;  . NECK SURGERY     fusion of neck-titanium hardware inplaced  . NECK SURGERY  [phone]  . UMBILICAL HERNIA REPAIR N/A 06/18/2015   Procedure: HERNIA REPAIR UMBILICAL ADULT WITH MESH;  Surgeon: Aviva Signs, MD;  Location: [facility];  Service: General;  Laterality: N/A;       Family History  Problem Relation Age of Onset  . Hypertension Father   . Cancer Mother 50       lung  . Stroke Mother   . Other Neg Hx        gynecomastia    Social History   Tobacco Use  . Smoking status: Current Every Day Smoker    Packs/day: 1.00    Years: 40.00    Pack years: 40.00    Types: Cigarettes  . Smokeless tobacco: Never Used  . Tobacco comment: Quit, now 6-7 cigs per day.  Substance Use Topics  . Alcohol use: Yes    Alcohol/week: 6.0 standard drinks    Types: 6 Cans of beer per week    Comment: 12 cans beer per day, quit 01/2015, pt states he drinks 6 beers a week  . Drug use: No    Comment: 03/2015 quit 20 yrs ago    Home Medications Prior to Admission medications   Medication Sig Start Date End Date Taking? Authorizing Provider  citalopram (CELEXA) 40 MG tablet Take 40 mg by mouth daily.  05/22/17   [provider]  Cyanocobalamin (VITAMIN B12 PO) Take 1 tablet by mouth daily.    [provider]  FOLIC ACID PO Take 1 tablet by mouth daily.    [provider]  meclizine (ANTIVERT) 25 MG tablet Take 25 mg by mouth 4 (four) times daily as needed for dizziness.  01/06/18   [provider]  QUEtiapine (SEROQUEL) 100 MG tablet Take 100 mg by mouth in the morning, at noon, and at bedtime.    [provider]  Thiamine HCl (B-1 PO) Take by mouth daily.    [provider]  traZODone (DESYREL)  150 MG tablet Take 150 mg by mouth at bedtime.    [provider]    Allergies    Aspirin  Review of Systems   Review of Systems  Unable to perform ROS: Mental status change    Physical Exam Updated Vital Signs BP 111/81   Pulse 74   Temp 98.7 F (37.1 C)   Resp 18   SpO2 100%   Physical Exam Vitals and nursing note reviewed.  Constitutional:      General: He is not in acute distress.    Appearance: He is well-developed. He is not toxic-appearing.     Comments: Alert, but appears confused.    HENT:     Head: Normocephalic and atraumatic.     Right Ear:  Tympanic membrane and ear canal normal. No hemotympanum.     Left Ear: Tympanic membrane and ear canal normal. No hemotympanum.  Eyes:     Extraocular Movements: Extraocular movements intact.     Conjunctiva/sclera: Conjunctivae normal.     Pupils: Pupils are equal, round, and reactive to light.  Neck:     Trachea: Phonation normal.  Cardiovascular:     Rate and Rhythm: Normal rate and regular rhythm.     Pulses: Normal pulses.  Pulmonary:     Effort: Pulmonary effort is normal. No respiratory distress.     Breath sounds: Normal breath sounds.  Chest:     Chest wall: No tenderness.  Abdominal:     General: There is no distension.     Palpations: Abdomen is soft.     Tenderness: There is no abdominal tenderness.  Musculoskeletal:        General: Normal range of motion.     Cervical back: Tenderness present. No rigidity or crepitus. Spinous process tenderness and muscular tenderness present.  Skin:    General: Skin is warm.     Capillary Refill: Capillary refill takes less than 2 seconds.     Findings: No rash.  Neurological:     General: No focal deficit present.     Mental Status: He is alert and oriented to person, place, and time.     GCS: GCS eye subscore is 4. GCS verbal subscore is 5. GCS motor subscore is 6.     Cranial Nerves: No cranial nerve deficit.     Sensory: No sensory deficit.      Motor: No abnormal muscle tone.     Coordination: Coordination normal.     Gait: Gait normal.     Deep Tendon Reflexes:     Reflex Scores:      Tricep reflexes are 2+ on the right side and 2+ on the left side.      Bicep reflexes are 2+ on the right side and 2+ on the left side.    Comments: CN III-XII grossly intact.  Speech clear.  No pronator drift  Psychiatric:        Mood and Affect: Mood normal.        Speech: Speech normal.        Behavior: Behavior normal.     ED Results / Procedures / Treatments   Labs (all labs ordered are listed, but only abnormal results are displayed) Labs Reviewed  CBC WITH DIFFERENTIAL/PLATELET - Abnormal; Notable for the following components:      Result Value   WBC 2.5 (*)    RBC 3.37 (*)    Hemoglobin 12.0 (*)    HCT 34.9 (*)    MCV 103.6 (*)    MCH 35.6 (*)    Platelets 75 (*)    Lymphs Abs 0.4 (*)    All other components within normal limits  BASIC METABOLIC PANEL - Abnormal; Notable for the following components:   Sodium 129 (*)    Chloride 94 (*)    Glucose, Bld 133 (*)    All other components within normal limits  AMMONIA  MAGNESIUM  ETHANOL  HEPATIC FUNCTION PANEL    EKG EKG Interpretation  Date/Time:  Monday November 12 2019 16:25:37 EDT Ventricular Rate:  65 PR Interval:  166 QRS Duration: 142 QT Interval:  444 QTC Calculation: 461 R Axis:   99 Text Interpretation: Normal sinus rhythm Right bundle branch block Abnormal ECG Confirmed by Milton Ferguson [phone]) on  11/12/2019 4:34:29 PM   Radiology CT HEAD WO CONTRAST  Result Date: 11/12/2019 CLINICAL DATA:  Neck trauma. Additional history provided: Patient reports falling on porch 4 weeks ago, now with headache, headache worsening, patient reports loss of consciousness at the time EXAM: CT HEAD WITHOUT CONTRAST CT CERVICAL SPINE WITHOUT CONTRAST TECHNIQUE: Multidetector CT imaging of the head and cervical spine was performed following the standard protocol without  intravenous contrast. Multiplanar CT image reconstructions of the cervical spine were also generated. COMPARISON:  Brain MRI 12/02/2017. Head CT 12/02/2017. Cervical spine MRI 08/07/2015. FINDINGS: CT HEAD FINDINGS Brain: Stable, mild generalized parenchymal atrophy. Stable, mild ill-defined hypoattenuation within the cerebral white matter which is nonspecific, but consistent with chronic small vessel ischemic disease. There is no acute intracranial hemorrhage. No demarcated cortical infarct. No extra-axial fluid collection. No evidence of intracranial mass. No midline shift. Vascular: No hyperdense vessel.  Atherosclerotic calcifications. Skull: Normal. Negative for fracture or focal lesion. Sinuses/Orbits: Visualized orbits show no acute finding. Mild ethmoid sinus mucosal thickening. No significant mastoid effusion. CT CERVICAL SPINE FINDINGS Alignment: There is an acute right II odontoid fracture through the base of the dens. There is mild, 1-2 mm posterior displacement of the distal odontoid fracture fragment. Grade 1 C4-C5 retrolisthesis. Skull base and vertebrae: The basion-dental and atlanto-dental intervals are maintained.No evidence of acute fracture to the cervical spine. Soft tissues and spinal canal: There is subtle prevertebral soft tissue swelling at the C1-C2 level. Disc levels: Acute type II odontoid fracture as described above. Additionally, there are minimally displaced acute fractures through the posterior arch of C1 bilaterally (series 2, image 23). Sequela of prior C3-C7 ACDF. There is a suspected pseudoarthrosis at the C4-C5 level. No evidence of acute hardware compromise. No high-grade bony spinal canal stenosis. Multilevel bony neural foraminal narrowing. Upper chest: No consolidation within the imaged lung apices. No visible pneumothorax. These results were called by telephone at the time of interpretation on 11/12/2019 at 3:33 pm to provider [facility] Calianne Larue , who verbally acknowledged these  results. IMPRESSION: CT head: 1. No evidence of acute intracranial abnormality. 2. Stable mild generalized parenchymal atrophy and chronic small vessel ischemic disease. 3. Mild ethmoid sinus mucosal thickening. CT cervical spine: 1. Acute type II odontoid fracture through the base of the dens with 1-2 mm posterior displacement of the distal odontoid fracture fragment. 2. Mildly displaced acute fractures of the C1 posterior arch bilaterally. 3. Consider CTA of the neck to assess for blunt cerebrovascular injury. 4. Sequela of prior C3-C7 ACDF. No evidence of acute hardware compromise. Suspected pseudoarthrosis at C4-C5. 5. C4-C5 grade 1 retrolisthesis. Electronically Signed   By: Kellie Simmering DO   On: 11/12/2019 15:33    Procedures Procedures (including critical care time)  Medications Ordered in ED Medications  fentaNYL (SUBLIMAZE) injection 12.5 mcg (12.5 mcg Intravenous Given 11/12/19 1618)  magnesium sulfate IVPB 2 g 50 mL (0 g Intravenous Stopped 11/12/19 1912)  thiamine tablet 100 mg (100 mg Oral Given 11/12/19 1804)    ED Course  I have reviewed the triage vital signs and the nursing notes.  Pertinent labs & imaging results that were available during my care of the patient were reviewed by me and considered in my medical decision making (see chart for details).    CRITICAL CARE Performed by: Terance Pomplun Total critical care time: 35 minutes Critical care time was exclusive of separately billable procedures and treating other patients. Critical care was necessary to treat or prevent imminent or life-threatening deterioration. Critical  care was time spent personally by me on the following activities: development of treatment plan with patient and/or surrogate as well as nursing, discussions with consultants, evaluation of patient's response to treatment, examination of patient, obtaining history from patient or surrogate, ordering and performing treatments and interventions, ordering and  review of laboratory studies, ordering and review of radiographic studies, pulse oximetry and re-evaluation of patient's condition.  MDM Rules/Calculators/A&P                          Patient here with complaints of diffuse, persistent headache for 4 weeks after a fall onto a porch with reported loss of consciousness.  Patient appears confused during exam answering yes to all review of systems.  On exam he is without focal neuro deficits.  Ambulatory with slow but steady gait.  Will obtain CT imaging of the head and C-spine and attempt to contact patient spouse for additional history information.  Patient's spouse here in the emergency department and provides clarity and additional history information.  She states that his fall on the porch was more like 8 weeks ago and he has been complaining of headache for 4 days.  She also states that he has been confused and has significant weight loss for several weeks with decreased appetite.  She also states that he typically a daily drinker, but has not drink any alcohol 2 days.  She has not witnessed any vomiting or seizure activity.  Patient denies seizures and he does not currently appear to be withdrawing.  On additional history, he admits to several additional falls this past week, he is unable to quantify how many falls he has actually had, but states the fall onto the porch has been the most significant.  1535  I was contacted by radiologist and patient has acute type II odontoid fracture in displaced fracture of the C1 vertebra, pt placed in [place] J collar.  I will consult neurosurgery to discuss further care.   Consulted neurosurgery, Dr. Ronnald Ramp and discussed findings.  He recommends hard cervical collar and office f/u in one week    On recheck, patient states headache has improved after IV pain medication.  Labs show leukopenia which appears to be baseline, hemoglobin at 12 also appears baseline.  He is also thrombocytopenic but again at baseline.   Ammonia level unremarkable.  Magnesium 1.5 and blood alcohol level less than 10.  AST and ALT mildly elevated along with mildly elevated total bilirubin.  Likely secondary to alcohol use and liver disease.  Mildly hyponatremic with sodium of 129.    I have offered hospital admission for his hyponatremia and hypomagnesemia.  Patient declines stating that he does not want to stay in the hospital, but he agrees to close outpatient follow-up with PCP.  And he also agrees to follow-up as recommended with neurosurgery.  I will give magnesium here, he currently takes thiamine and multivitamin.  He does have a new right bundle branch block on EKG, but there is no recent EKG for comparison.  The bundle branch block was not present 2 years ago.  Doubt this is clinically significant at this time, but this was discussed with the patient and I recommended that he mention this to his PCP for possible referral to cardiology.  I feel that he is appropriate for discharge home, strict return precautions were discussed.  Final Clinical Impression(s) / ED Diagnoses Final diagnoses:  Fall, initial encounter  Closed odontoid fracture, initial encounter ([place])  Closed displaced fracture of posterior arch of first cervical vertebra, initial encounter [facility])    Rx / [place] Orders ED Discharge Orders    None       Bufford Lope 11/12/19 2117    Margette Fast, MD 11/14/19 [phone]

## 2019-11-12 NOTE — ED Notes (Signed)
Wife is in room with patient.  She states the patient has not been acting normal since last Thursday.  Wife states he was like this one other time and the doctor found that his thiamine was low.

## 2019-11-12 NOTE — ED Triage Notes (Signed)
Fell on the porch 4 weeks ago and now has a headache, states the headache has been getting worse. States he lost consciousness at the time but did not seek medical attention

## 2019-11-12 NOTE — Progress Notes (Signed)
Patient ID: [name], male   DOB: March 19, 1955, 64 y.o.   MRN: [id number] We were called to look at the ct on this gentleman who fell 2 months ago on his porch and has been having multiple falls at home. Chronic etoh abuse. Has headaches and confusion, CT head neg. CT C-spine shows linear non displaced fx thru pars of C1 B, and odontoid fx that is oblique starting at base on L and moving suPerior and to R, mild posterior displacement.  This fx may be up to 2 months old, and is not ideal for anterior odontoid screw placement based on morphology and age of fx.   Would recommend semi-rigid orthosis (c collar) and follow up with [place] a week or so after discharge. He has been walking around and falling with this fx not braced for wks, so I suspect he has tested its gross stability.   Will follow him with serial imaging on outpt basis with delayed stabililization in the future if it doesn't heal. Would not recommend halo in alcoholic that falls often.

## 2019-11-28 ENCOUNTER — Telehealth: Payer: Self-pay

## 2019-11-28 NOTE — Telephone Encounter (Signed)
NOTES ON FILE FROM [facility] [phone], SENT REFERRAL TO SCHEDULING °

## 2019-12-10 ENCOUNTER — Encounter: Payer: Self-pay | Admitting: Internal Medicine

## 2019-12-10 ENCOUNTER — Ambulatory Visit: Payer: Medicare Other | Admitting: Internal Medicine

## 2019-12-10 ENCOUNTER — Other Ambulatory Visit: Payer: Self-pay

## 2019-12-10 VITALS — BP 130/78 | HR 73 | Ht 68.0 in | Wt 147.6 lb

## 2019-12-10 DIAGNOSIS — Z720 Tobacco use: Secondary | ICD-10-CM | POA: Diagnosis not present

## 2019-12-10 DIAGNOSIS — D649 Anemia, unspecified: Secondary | ICD-10-CM

## 2019-12-10 DIAGNOSIS — I4510 Unspecified right bundle-branch block: Secondary | ICD-10-CM | POA: Diagnosis not present

## 2019-12-10 DIAGNOSIS — E109 Type 1 diabetes mellitus without complications: Secondary | ICD-10-CM | POA: Insufficient documentation

## 2019-12-10 HISTORY — DX: Tobacco use: Z72.0

## 2019-12-10 NOTE — Patient Instructions (Signed)
Medication Instructions:  *If you need a refill on your cardiac medications before your next appointment, please call your pharmacy*  Follow-Up: At [facility], you and your health needs are our priority.  As part of our continuing mission to provide you with exceptional heart care, we have created designated Provider Care Teams.  These Care Teams include your primary Cardiologist (physician) and Advanced Practice Providers (APPs -  Physician Assistants and Nurse Practitioners) who all work together to provide you with the care you need, when you need it.  We recommend signing up for the patient portal called "MyChart".  Sign up information is provided on this After Visit Summary.  MyChart is used to connect with patients for Virtual Visits (Telemedicine).  Patients are able to view lab/test results, encounter notes, upcoming appointments, etc.  Non-urgent messages can be sent to your provider as well.   To learn more about what you can do with MyChart, go to [email or web address].    Your next appointment:   Your physician wants you to follow-up in: 1 YEAR with Dr. Gasper Sells.  You will receive a reminder letter in the mail two months in advance. If you don't receive a letter, please call our office to schedule the follow-up appointment.   The format for your next appointment:   In Person with Rudean Haskell, MD

## 2019-12-10 NOTE — Progress Notes (Signed)
Cardiology Office Note:    Date:  12/10/2019   ID:  [name], DOB 1955/11/08, MRN [id number]  PCP:  Jared Sacramento, MD  [facility] Cardiologist:  No primary care provider on file.  [facility] Electrophysiologist:  None   Referring MD: Jared Pap P, DO  . CC:  Unclear; has RBBB Consulted for the evaluation of RBBB at the behest of Jared Sacramento, MD  History of Present Illness:    [name] is a 64 y.o. male with a hx of Liver Cirrhosis (alcohol), blood loss anemia (last hemoglobin 12),  Thrombocytopenia,  who presents for RBBB.  Patient notes that he feels great.  Works as a Neurosurgeon.  Patient had trama to his neck a few weeks back and is in a neck brace.  Through this, no chest pain, chest pressure, chest heaviness.  On occasion, will not 1s chest throb, but not associated with exercise and not improved with rest.  Last happened several weeks about and has not occurred with cessation of trazodone.  No jaw pain, arm pain, or back pain.  No DOE, PND, Orthopnea, or SOB.  No prior stress testing or heart catheterization.  Takes 81 mg ASA as primary prevention.  Past Medical History:  Diagnosis Date  . Anemia   . Anxiety   . Arthritis 08-16-12   generalized arthritis  . Cirrhosis ([place])   . Cirrhosis of liver ([place])   . Clotting disorder ([place])   . Diabetes mellitus without complication ([place])   . Foreign body 08/16/2012   nose"BB" pellet since age 34. remains.  Marland Kitchen GERD (gastroesophageal reflux disease)   . History of ETOH abuse 08-16-12   Heavy alcohol use daily  . Pancreatitis 08-16-12   at present weakness,fatiques easily  . Platelets decreased ([place])    Was told by Dr. Areta Glover a little low"-not sure how low per spouse -[name]  . Substance abuse ([place])   . Transfusion history 08-16-12   '99  . Wernicke encephalopathy     Past Surgical History:  Procedure Laterality Date  . COLONOSCOPY    . ESOPHAGOGASTRODUODENOSCOPY    .  ESOPHAGOGASTRODUODENOSCOPY N/A 08/15/2019   Procedure: ESOPHAGOGASTRODUODENOSCOPY (EGD);  Surgeon: Jared Silence, MD;  Location: [facility];  Service: Endoscopy;  Laterality: N/A;  Pt will need 1 bag of pheresed platelets pre-procedure  . ESOPHAGOGASTRODUODENOSCOPY (EGD) WITH PROPOFOL N/A 02/11/2016   Procedure: ESOPHAGOGASTRODUODENOSCOPY (EGD) WITH PROPOFOL;  Surgeon: Jared Silence, MD;  Location: [facility];  Service: Endoscopy;  Laterality: N/A;  . EUS N/A 08/18/2012   Procedure: UPPER ENDOSCOPIC ULTRASOUND (EUS) LINEAR;  Surgeon: Jared Beams, MD;  Location: [facility];  Service: Endoscopy;  Laterality: N/A;  . INSERTION OF MESH N/A 06/18/2015   Procedure: INSERTION OF MESH;  Surgeon: Jared Signs, MD;  Location: [facility];  Service: General;  Laterality: N/A;  . NECK SURGERY     fusion of neck-titanium hardware inplaced  . NECK SURGERY  [phone]  . UMBILICAL HERNIA REPAIR N/A 06/18/2015   Procedure: HERNIA REPAIR UMBILICAL ADULT WITH MESH;  Surgeon: Jared Signs, MD;  Location: [facility];  Service: General;  Laterality: N/A;    Current Medications: Current Meds  Medication Sig  . ARIPiprazole (ABILIFY) 5 MG tablet Take 5 mg by mouth as needed.  . citalopram (CELEXA) 40 MG tablet Take 40 mg by mouth daily.   . Cyanocobalamin (VITAMIN B12 PO) Take 1 tablet by mouth daily.  Marland Kitchen FOLIC ACID PO Take 1 tablet by mouth daily.  Marland Kitchen  magnesium oxide (MAG-OX) 400 MG tablet Take 2 tablets by mouth 2 (two) times daily.  . QUEtiapine (SEROQUEL) 100 MG tablet Take 100 mg by mouth in the morning, at noon, and at bedtime.  . Thiamine HCl (B-1 PO) Take by mouth daily.  . traZODone (DESYREL) 150 MG tablet Take 150 mg by mouth as needed.    Current Facility-Administered Medications for the 12/10/19 encounter (Office Visit) with Jared Lean, MD  Medication  . tamoxifen (NOLVADEX) tablet 10 mg    Allergies:   Patient has no active allergies.  Notes an intolerance to medication but not a true  allergy  Social History   Socioeconomic History  . Marital status: Married    Spouse name: [name]  . Number of children: 2  . Years of education: 9  . Highest education level: Not on file  Occupational History    Comment: disabled  Tobacco Use  . Smoking status: Current Every Day Smoker    Packs/day: 1.00    Years: 40.00    Pack years: 40.00    Types: Cigarettes  . Smokeless tobacco: Never Used  . Tobacco comment: Quit, now 6-7 cigs per day.  Substance and Sexual Activity  . Alcohol use: Yes    Alcohol/week: 6.0 standard drinks    Types: 6 Cans of beer per week    Comment: 12 cans beer per day, quit 01/2015, pt states he drinks 6 beers a week  . Drug use: No    Comment: 03/2015 quit 20 yrs ago  . Sexual activity: Yes  Other Topics Concern  . Not on file  Social History Narrative   Lives at home with wife   Caffeine use- sodas 2 a day   Social Determinants of Health   Financial Resource Strain:   . Difficulty of Paying Living Expenses: Not on file  Food Insecurity:   . Worried About Charity fundraiser in the Last Year: Not on file  . Ran Out of Food in the Last Year: Not on file  Transportation Needs:   . Lack of Transportation (Medical): Not on file  . Lack of Transportation (Non-Medical): Not on file  Physical Activity:   . Days of Exercise per Week: Not on file  . Minutes of Exercise per Session: Not on file  Stress:   . Feeling of Stress : Not on file  Social Connections:   . Frequency of Communication with Friends and Family: Not on file  . Frequency of Social Gatherings with Friends and Family: Not on file  . Attends Religious Services: Not on file  . Active Member of Clubs or Organizations: Not on file  . Attends Archivist Meetings: Not on file  . 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  Family History: The patient's family history includes Cancer (age of onset: 1) in his mother; Hypertension in his  father; Stroke in his mother. There is no history of Other. No heart disease in family.  ROS:   Please see the history of present illness.    All other systems reviewed and are negative.  EKGs/Labs/Other Studies Reviewed:    The following studies were reviewed today:  EKG:   11/13/2019:  Sinus rhythm, rate 65, RBBB with RAD  Echo 08/01/2014: No WMAs- Study on  LTS - Left ventricle: The cavity size was normal. Systolic function was  normal. The estimated ejection fraction was in the range of 55%  to  60%. Wall motion was normal; there were no regional wall  motion abnormalities.  - Left atrium: The atrium was mildly dilated.   Recent Labs: 11/12/2019: ALT 56; BUN 16; Creatinine, Ser 0.82; Hemoglobin 12.0; Magnesium 1.5; Platelets 75; Potassium 4.3; Sodium 129  Recent Lipid Panel    Component Value Date/Time   CHOL  06/15/2007 0948    149        ATP III CLASSIFICATION:  <200     mg/dL   Desirable  200-239  mg/dL   Borderline High  >=240    mg/dL   High   TRIG 170 (H) 06/15/2007 0948   HDL 17 (L) 06/15/2007 0948   CHOLHDL 8.8 06/15/2007 0948   VLDL 34 06/15/2007 0948   LDLCALC  06/15/2007 0948    98        Total Cholesterol/HDL:CHD Risk Coronary Heart Disease Risk Table                     Men   Women  1/2 Average Risk   3.4   3.3   Physical Exam:    VS:  BP 130/78   Pulse 73   Ht 5\' 8"  (1.727 m)   Wt 147 lb 9.6 oz (67 kg)   SpO2 97%   BMI 22.44 kg/m     Wt Readings from Last 3 Encounters:  12/10/19 147 lb 9.6 oz (67 kg)  08/15/19 158 lb (71.7 kg)  03/22/18 173 lb 9.6 oz (78.7 kg)     GEN: Well nourished, well developed in no acute distress HEENT: Deferred:  C collar in place NECK: Unable JVD; No carotid bruits LYMPHATICS: No lymphadenopathy CARDIAC: RRR, no murmurs, rubs, gallops RESPIRATORY:  Good air movement, occasional expiratory wheezes in the bases ABDOMEN: Soft, non-tender, non-distended MUSCULOSKELETAL:  No edema; No deformity  SKIN: Warm  and dry NEUROLOGIC:  Alert and oriented x 3 PSYCHIATRIC:  Normal affect   ASSESSMENT:    1. RBBB   2. Anemia, unspecified type   3. Tobacco abuse    PLAN:    In order of problems listed above:  1. RBBB and primary prevention visit - no strong indication for primary prevention ASA - Cholesterol well controlled - Blood pressure well controlled - Discuss tobacco abuse and risk of CAD  Tobacco Abuse- Contemplation stage 1. The patient was counseled on the dangers of tobacco use, both inhaled and oral, which include, but are not limited to cardiovascular disease, increased cancer risk of multiple types of cancer, COPD, peripheral vascular disease, strokes. 2. He was also counseled on the benefits of smoking cessation. 3. The patient was firmly advised to quit.    4. We also reviewed strategies to maximize success, including:  Removing cigarettes and smoking materials from environment  Stress management  Substitution of other forms of reinforcement Support of family/friends.  Selecting a quit date.  Patient provided contact information for 1-800-QUIT-NOW   One year follow up unless new symptoms or abnormal test results warranting change in plan  Medication Adjustments/Labs and Tests Ordered: Current medicines are reviewed at length with the patient today.  Concerns regarding medicines are outlined above.  No orders of the defined types were placed in this encounter.  No orders of the defined types were placed in this encounter.   There are no Patient Instructions on file for this visit.   Signed, Jared Lean, MD  12/10/2019 9:51 AM    [place]

## 2020-01-11 ENCOUNTER — Other Ambulatory Visit: Payer: Self-pay | Admitting: Internal Medicine

## 2020-01-11 DIAGNOSIS — F17200 Nicotine dependence, unspecified, uncomplicated: Secondary | ICD-10-CM

## 2020-01-29 ENCOUNTER — Inpatient Hospital Stay: Admission: RE | Admit: 2020-01-29 | Payer: Medicare Other | Source: Ambulatory Visit

## 2020-02-19 ENCOUNTER — Other Ambulatory Visit: Payer: Self-pay

## 2020-02-19 ENCOUNTER — Ambulatory Visit
Admission: RE | Admit: 2020-02-19 | Discharge: 2020-02-19 | Disposition: A | Discharge status: Home / Self Care | Payer: Medicare Other | Source: Ambulatory Visit | Attending: Internal Medicine | Admitting: Internal Medicine

## 2020-02-19 DIAGNOSIS — F17200 Nicotine dependence, unspecified, uncomplicated: Secondary | ICD-10-CM

## 2020-03-27 ENCOUNTER — Other Ambulatory Visit: Payer: Self-pay | Admitting: Gastroenterology

## 2020-03-27 DIAGNOSIS — K7469 Other cirrhosis of liver: Secondary | ICD-10-CM

## 2020-04-14 ENCOUNTER — Ambulatory Visit
Admission: RE | Admit: 2020-04-14 | Discharge: 2020-04-14 | Disposition: A | Discharge status: Home / Self Care | Payer: Medicare Other | Source: Ambulatory Visit | Attending: Gastroenterology | Admitting: Gastroenterology

## 2020-04-14 DIAGNOSIS — K7469 Other cirrhosis of liver: Secondary | ICD-10-CM

## 2020-05-21 ENCOUNTER — Other Ambulatory Visit: Payer: Self-pay | Admitting: Internal Medicine

## 2020-05-21 DIAGNOSIS — R42 Dizziness and giddiness: Secondary | ICD-10-CM

## 2020-05-27 ENCOUNTER — Other Ambulatory Visit: Payer: Self-pay | Admitting: Internal Medicine

## 2020-05-27 ENCOUNTER — Ambulatory Visit
Admission: RE | Admit: 2020-05-27 | Discharge: 2020-05-27 | Disposition: A | Discharge status: Home / Self Care | Payer: Medicare Other | Source: Ambulatory Visit | Attending: Internal Medicine | Admitting: Internal Medicine

## 2020-05-27 ENCOUNTER — Inpatient Hospital Stay (HOSPITAL_COMMUNITY): Admission: RE | Admit: 2020-05-27 | Payer: Medicare Other | Source: Ambulatory Visit

## 2020-05-27 DIAGNOSIS — R42 Dizziness and giddiness: Secondary | ICD-10-CM

## 2020-05-27 MED ORDER — IOPAMIDOL (ISOVUE-370) INJECTION 76%
75.0000 mL | Freq: Once | INTRAVENOUS | Status: AC | PRN
  Administered 2020-05-27: 75 mL via INTRAVENOUS

## 2020-05-31 ENCOUNTER — Other Ambulatory Visit: Payer: Medicare Other

## 2020-06-09 ENCOUNTER — Other Ambulatory Visit: Payer: Self-pay | Admitting: Gastroenterology

## 2020-06-09 DIAGNOSIS — R109 Unspecified abdominal pain: Secondary | ICD-10-CM

## 2020-06-09 DIAGNOSIS — R634 Abnormal weight loss: Secondary | ICD-10-CM

## 2020-06-09 DIAGNOSIS — K859 Acute pancreatitis without necrosis or infection, unspecified: Secondary | ICD-10-CM

## 2020-06-09 DIAGNOSIS — K7460 Unspecified cirrhosis of liver: Secondary | ICD-10-CM

## 2020-06-24 ENCOUNTER — Ambulatory Visit
Admission: RE | Admit: 2020-06-24 | Discharge: 2020-06-24 | Disposition: A | Discharge status: Home / Self Care | Payer: Medicare Other | Source: Ambulatory Visit | Attending: Gastroenterology | Admitting: Gastroenterology

## 2020-06-24 ENCOUNTER — Other Ambulatory Visit: Payer: Self-pay

## 2020-06-24 DIAGNOSIS — K7460 Unspecified cirrhosis of liver: Secondary | ICD-10-CM

## 2020-06-24 DIAGNOSIS — K859 Acute pancreatitis without necrosis or infection, unspecified: Secondary | ICD-10-CM

## 2020-06-24 DIAGNOSIS — R634 Abnormal weight loss: Secondary | ICD-10-CM

## 2020-06-24 DIAGNOSIS — R109 Unspecified abdominal pain: Secondary | ICD-10-CM

## 2020-06-24 MED ORDER — IOPAMIDOL (ISOVUE-300) INJECTION 61%
100.0000 mL | Freq: Once | INTRAVENOUS | Status: AC | PRN
  Administered 2020-06-24: 100 mL via INTRAVENOUS

## 2020-07-18 ENCOUNTER — Other Ambulatory Visit: Payer: Self-pay | Admitting: *Deleted

## 2020-07-18 DIAGNOSIS — K551 Chronic vascular disorders of intestine: Secondary | ICD-10-CM

## 2020-07-29 ENCOUNTER — Other Ambulatory Visit: Payer: Self-pay | Admitting: Internal Medicine

## 2020-07-29 DIAGNOSIS — S12031D Nondisplaced posterior arch fracture of first cervical vertebra, subsequent encounter for fracture with routine healing: Secondary | ICD-10-CM

## 2020-07-29 DIAGNOSIS — R42 Dizziness and giddiness: Secondary | ICD-10-CM

## 2020-08-08 ENCOUNTER — Other Ambulatory Visit: Payer: Self-pay

## 2020-08-08 ENCOUNTER — Ambulatory Visit (HOSPITAL_COMMUNITY)
Admission: RE | Admit: 2020-08-08 | Discharge: 2020-08-08 | Disposition: A | Discharge status: Home / Self Care | Payer: Medicare Other | Source: Ambulatory Visit | Attending: Vascular Surgery | Admitting: Vascular Surgery

## 2020-08-08 ENCOUNTER — Encounter: Payer: Self-pay | Admitting: Vascular Surgery

## 2020-08-08 ENCOUNTER — Ambulatory Visit: Payer: Medicare Other | Admitting: Vascular Surgery

## 2020-08-08 VITALS — BP 135/75 | HR 56 | Temp 98.1°F | Resp 20 | Ht 68.0 in | Wt 148.0 lb

## 2020-08-08 DIAGNOSIS — I6523 Occlusion and stenosis of bilateral carotid arteries: Secondary | ICD-10-CM | POA: Diagnosis not present

## 2020-08-08 DIAGNOSIS — K551 Chronic vascular disorders of intestine: Secondary | ICD-10-CM

## 2020-08-08 NOTE — Progress Notes (Signed)
Patient ID: [name], male   DOB: 05/23/1955, 65 y.o.   MRN: [id number]  Reason for Consult: New Patient (Initial Visit)   Referred by Jared Sacramento, MD  Subjective:     HPI:  [name] is a 65 y.o. male sent for evaluation of chronic mesenteric ischemia.  Patient states that he has lost approximately 60 pounds in the last year but he did quit drinking in October of last year.  At that time he was drinking 30 beers daily.  He continues to smoke daily.  He denies any history of stroke, TIA or amaurosis.  Denies any history of aneurysm disease.  States that he does not have any lower extremity issues other than balance issues that limit him from walking.  He is able to eat without limitation although he only eats 1 meal daily he does not have any pain or food fear.  Past Medical History:  Diagnosis Date  . Anemia   . Anxiety   . Arthritis 08-16-12   generalized arthritis  . Cirrhosis ([place])   . Cirrhosis of liver ([place])   . Clotting disorder ([place])   . Diabetes mellitus without complication ([place])   . Foreign body 08/16/2012   nose"BB" pellet since age 38. remains.  Marland Kitchen GERD (gastroesophageal reflux disease)   . History of ETOH abuse 08-16-12   Heavy alcohol use daily  . Pancreatitis 08-16-12   at present weakness,fatiques easily  . Platelets decreased ([place])    Was told by Dr. Areta Haber a little low"-not sure how low per spouse -[name]  . Substance abuse ([place])   . Transfusion history 08-16-12   '99  . Wernicke encephalopathy    Family History  Problem Relation Age of Onset  . Hypertension Father   . Cancer Mother 47       lung  . Stroke Mother   . Other Neg Hx        gynecomastia   Past Surgical History:  Procedure Laterality Date  . COLONOSCOPY    . ESOPHAGOGASTRODUODENOSCOPY    . ESOPHAGOGASTRODUODENOSCOPY N/A 08/15/2019   Procedure: ESOPHAGOGASTRODUODENOSCOPY (EGD);  Surgeon: Jared Silence, MD;  Location: [facility];  Service: Endoscopy;  Laterality: N/A;   Pt will need 1 bag of pheresed platelets pre-procedure  . ESOPHAGOGASTRODUODENOSCOPY (EGD) WITH PROPOFOL N/A 02/11/2016   Procedure: ESOPHAGOGASTRODUODENOSCOPY (EGD) WITH PROPOFOL;  Surgeon: Jared Silence, MD;  [facility];  Service: Endoscopy;  Laterality: N/A;  . EUS N/A 08/18/2012   Procedure: UPPER ENDOSCOPIC ULTRASOUND (EUS) LINEAR;  Surgeon: Jared Beams, MD;  Location: [facility];  Service: Endoscopy;  Laterality: N/A;  . INSERTION OF MESH N/A 06/18/2015   Procedure: INSERTION OF MESH;  Surgeon: Jared Signs, MD;  Location: [facility];  Service: General;  Laterality: N/A;  . NECK SURGERY     fusion of neck-titanium hardware inplaced  . NECK SURGERY  [phone]  . UMBILICAL HERNIA REPAIR N/A 06/18/2015   Procedure: HERNIA REPAIR UMBILICAL ADULT WITH MESH;  Surgeon: Jared Signs, MD;  Location: [facility];  Service: General;  Laterality: N/A;    Short Social History:  Social History   Tobacco Use  . Smoking status: Current Every Day Smoker    Packs/day: 1.00    Years: 40.00    Pack years: 40.00    Types: Cigarettes  . Smokeless tobacco: Never Used  . Tobacco comment: Quit, now 6-7 cigs per day.  Substance Use Topics  . Alcohol use: Not Currently    Alcohol/week: 6.0 standard drinks  Types: 6 Cans of beer per week    Comment: 12 cans beer per day, quit 01/2015, pt states he drinks 6 beers a week    No Known Allergies  Current Outpatient Medications  Medication Sig Dispense Refill  . ARIPiprazole (ABILIFY) 5 MG tablet Take 5 mg by mouth as needed.    Marland Kitchen aspirin 81 MG EC tablet Take by mouth.    . Cyanocobalamin (VITAMIN B12 PO) Take 1 tablet by mouth daily.    Marland Kitchen FOLIC ACID PO Take 1 tablet by mouth daily.    Marland Kitchen L-Methylfolate-B6-B12 (FOLBIC RF) 1.13-25-2 MG TABS     . Magnesium Gluconate 550 MG TABS     . magnesium oxide (MAG-OX) 400 MG tablet Take 2 tablets by mouth 2 (two) times daily.    . meclizine (ANTIVERT) 25 MG tablet Take 25 mg by mouth 2 (two) times daily as  needed.    Marland Kitchen QUEtiapine (SEROQUEL) 100 MG tablet Take 100 mg by mouth in the morning, at noon, and at bedtime.    . sertraline (ZOLOFT) 50 MG tablet Take 1 tablet by mouth 2 (two) times daily.    . Thiamine HCl (B-1 PO) Take by mouth daily.    . traZODone (DESYREL) 150 MG tablet Take 150 mg by mouth as needed.      Current Facility-Administered Medications  Medication Dose Route Frequency Provider Last Rate Last Admin  . tamoxifen (NOLVADEX) tablet 10 mg  10 mg Oral Daily Jared Shin, MD        Review of Systems  Constitutional: Positive for unexpected weight change.  HENT: HENT negative.  Eyes: Eyes negative.  Respiratory: Respiratory negative.  Cardiovascular: Cardiovascular negative.  GI: Gastrointestinal negative.  Musculoskeletal: Musculoskeletal negative.  Skin: Skin negative.  Neurological: Positive for dizziness.  Hematologic: Hematologic/lymphatic negative.  Psychiatric: Psychiatric negative.        Objective:  Objective   Vitals:   08/08/20 0943  BP: 135/75  Pulse: (!) 56  Resp: 20  Temp: 98.1 F (36.7 C)  SpO2: 96%  Weight: 148 lb (67.1 kg)  Height: 5\' 8"  (1.727 m)   Body mass index is 22.5 kg/m.  Physical Exam HENT:     Head: Normocephalic.     Nose:     Comments: Wearing a mask Eyes:     Pupils: Pupils are equal, round, and reactive to light.  Neck:     Vascular: No carotid bruit.  Cardiovascular:     Rate and Rhythm: Normal rate.     Pulses:          Femoral pulses are 2+ on the right side and 2+ on the left side. Abdominal:     General: Abdomen is flat.     Palpations: Abdomen is soft. There is no mass.     Comments: No bruits  Musculoskeletal:        General: No swelling. Normal range of motion.  Skin:    General: Skin is warm and dry.     Capillary Refill: Capillary refill takes less than 2 seconds.  Neurological:     General: No focal deficit present.     Mental Status: He is alert.  Psychiatric:        Mood and Affect: Mood  normal.        Behavior: Behavior normal.        Thought Content: Thought content normal.        Judgment: Judgment normal.     Data: Duplex Findings:  +------------+--------+--------+--------+--------------------+  Mesenteric PSV cm/sEDV cm/s Plaque    Comments     +------------+--------+--------+--------+--------------------+  SMA Proximal 312      calcificrestrictive waveform  +------------+--------+--------+--------+--------------------+  SMA Mid    130                      +------------+--------+--------+--------+--------------------+  CHA      112                      +------------+--------+--------+--------+--------------------+  Splenic    130                      +------------+--------+--------+--------+--------------------+     Summary:  Mesenteric:    Increased velocity in the proximal SMA with resistant waveform.      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  He states that he eats without pain his weight loss mostly related to cessation of drinking 30 beers daily.  I recommended smoking cessation.  He also has carotid stenosis on recent CTA I do not think is related to his dizziness.  He will follow-up in 1 year with repeat mesenteric duplex as well as carotid duplex.  We discussed the Glover and symptoms of stroke.     Waynetta Sandy MD [facility]

## 2020-10-24 ENCOUNTER — Other Ambulatory Visit: Payer: Self-pay | Admitting: Gastroenterology

## 2020-10-24 DIAGNOSIS — K7460 Unspecified cirrhosis of liver: Secondary | ICD-10-CM

## 2020-11-03 ENCOUNTER — Other Ambulatory Visit: Payer: Self-pay

## 2020-11-03 ENCOUNTER — Ambulatory Visit
Admission: RE | Admit: 2020-11-03 | Discharge: 2020-11-03 | Disposition: A | Discharge status: Home / Self Care | Payer: Medicare Other | Source: Ambulatory Visit | Attending: Gastroenterology | Admitting: Gastroenterology

## 2020-11-03 DIAGNOSIS — K7460 Unspecified cirrhosis of liver: Secondary | ICD-10-CM

## 2021-04-28 ENCOUNTER — Other Ambulatory Visit (HOSPITAL_COMMUNITY): Payer: Self-pay | Admitting: Neurosurgery

## 2021-04-28 DIAGNOSIS — I671 Cerebral aneurysm, nonruptured: Secondary | ICD-10-CM

## 2021-04-29 ENCOUNTER — Other Ambulatory Visit: Payer: Self-pay | Admitting: Gastroenterology

## 2021-04-29 DIAGNOSIS — K7460 Unspecified cirrhosis of liver: Secondary | ICD-10-CM

## 2021-05-05 ENCOUNTER — Ambulatory Visit
Admission: RE | Admit: 2021-05-05 | Discharge: 2021-05-05 | Disposition: A | Discharge status: Home / Self Care | Payer: Medicare Other | Source: Ambulatory Visit | Attending: Gastroenterology | Admitting: Gastroenterology

## 2021-05-05 DIAGNOSIS — K7460 Unspecified cirrhosis of liver: Secondary | ICD-10-CM

## 2021-06-02 ENCOUNTER — Other Ambulatory Visit: Payer: Self-pay

## 2021-06-02 ENCOUNTER — Other Ambulatory Visit: Payer: Self-pay | Admitting: Family Medicine

## 2021-06-02 ENCOUNTER — Ambulatory Visit
Admission: RE | Admit: 2021-06-02 | Discharge: 2021-06-02 | Disposition: A | Discharge status: Home / Self Care | Payer: Medicare Other | Source: Ambulatory Visit | Attending: Family Medicine | Admitting: Family Medicine

## 2021-06-02 DIAGNOSIS — R0602 Shortness of breath: Secondary | ICD-10-CM

## 2021-06-02 DIAGNOSIS — R634 Abnormal weight loss: Secondary | ICD-10-CM

## 2021-06-02 MED ORDER — IOPAMIDOL (ISOVUE-300) INJECTION 61%
75.0000 mL | Freq: Once | INTRAVENOUS | Status: AC | PRN
  Administered 2021-06-02: 75 mL via INTRAVENOUS

## 2021-07-16 ENCOUNTER — Other Ambulatory Visit: Payer: Self-pay

## 2021-07-16 DIAGNOSIS — K551 Chronic vascular disorders of intestine: Secondary | ICD-10-CM

## 2021-07-16 DIAGNOSIS — I6523 Occlusion and stenosis of bilateral carotid arteries: Secondary | ICD-10-CM

## 2021-08-18 ENCOUNTER — Other Ambulatory Visit: Payer: Self-pay | Admitting: *Deleted

## 2021-08-18 DIAGNOSIS — K551 Chronic vascular disorders of intestine: Secondary | ICD-10-CM

## 2021-08-18 DIAGNOSIS — I6523 Occlusion and stenosis of bilateral carotid arteries: Secondary | ICD-10-CM

## 2021-08-20 NOTE — Progress Notes (Unsigned)
Office Note     CC:  follow up Requesting Provider:  Velna Hatchet, MD  Patient states that he has lost approximately 60 pounds in the last year but he did quit drinking in October of last year. He was last seen in May of 2022 by Dr. Donzetta Matters. At that time he reported a significant weight loss however this was after abstaining from drinking a significant amount of alcohol (was drinking 30 beers daily). His duplex at the time was stable with known SMA stenosis. He was without any associated abdominal symptoms. He also did not have any new neurological symptoms.   Today he reports he is able to eat without limitation. Feels that his appetite is good. he does not have any post prandial pain or food fear. He has not had any further significant weight loss. He continues to smoke daily. He denies any history of stroke, TIA or amaurosis.  He denies any visual changes, slurred speech, facial drooping or unilateral upper or lower extremity weakness or numbness. He denies any pain on ambulation or rest. No tissue loss. He says his legs feel better than they have in years. He says he has been feeling great and he is currently working [employer] for [employer].   The pt is not on a statin for cholesterol management.  The pt is on a daily aspirin.  Other AC:  none The pt is not on medication for hypertension.   The pt is not diabetic.   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   GERD (gastroesophageal reflux disease)    History of ETOH abuse 08-16-12   Heavy alcohol use daily   Pancreatitis 08-16-12   at present weakness,fatiques easily   Platelets decreased [facility])    Was told by Dr. Areta Haber a little low"-not sure how low per  spouse -Reino Bellis   Substance abuse [facility])    Transfusion history 08-16-12   '99   Wernicke encephalopathy     Past Surgical History:  Procedure Laterality Date   COLONOSCOPY     ESOPHAGOGASTRODUODENOSCOPY     ESOPHAGOGASTRODUODENOSCOPY N/A 08/15/2019   Procedure: ESOPHAGOGASTRODUODENOSCOPY (EGD);  Surgeon: Arta Silence, MD;  Location: [facility];  Service: Endoscopy;  Laterality: N/A;  Pt will need 1 bag of pheresed platelets pre-procedure   ESOPHAGOGASTRODUODENOSCOPY (EGD) WITH PROPOFOL N/A 02/11/2016   Procedure: ESOPHAGOGASTRODUODENOSCOPY (EGD) WITH PROPOFOL;  Surgeon: Arta Silence, MD;  Location: [facility];  Service: Endoscopy;  Laterality: N/A;   EUS N/A 08/18/2012   Procedure: UPPER ENDOSCOPIC ULTRASOUND (EUS) LINEAR;  Surgeon: Beryle Beams, MD;  Location: [facility];  Service: Endoscopy;  Laterality: N/A;   INSERTION OF MESH N/A 06/18/2015   Procedure: INSERTION OF MESH;  Surgeon: Aviva Signs, MD;  Location: [facility];  Service: General;  Laterality: N/A;   NECK SURGERY     fusion of neck-titanium hardware inplaced   NECK SURGERY  8588,5027   UMBILICAL HERNIA REPAIR N/A 06/18/2015   Procedure: HERNIA REPAIR UMBILICAL ADULT WITH MESH;  Surgeon: Aviva Signs, MD;  Location: [facility];  Service: General;  Laterality: N/A;    Social History   Socioeconomic History   Marital status: Married  Spouse name: Harriett   Number of children: 2   Years of education: 9   Highest education level: Not on file  Occupational History    Comment: disabled  Tobacco Use   Smoking status: Every Day    Packs/day: 1.00    Years: 40.00    Total pack years: 40.00    Types: Cigarettes   Smokeless tobacco: Never   Tobacco comments:    Quit, now 6-7 cigs per day.  Vaping Use   Vaping Use: Never used  Substance and Sexual Activity   Alcohol use: Not Currently    Alcohol/week: 6.0 standard drinks of alcohol    Types: 6 Cans of beer per week    Comment: 12 cans beer per day, quit 01/2015, pt  states he drinks 6 beers a week   Drug use: No    Comment: 03/2015 quit 20 yrs ago   Sexual activity: Yes  Other Topics Concern   Not on file  Social History Narrative   Lives at home with wife   Caffeine use- sodas 2 a day   Social Determinants of Health   Financial Resource Strain: Not on file  Food Insecurity: Not on file  Transportation Needs: Not on file  Physical Activity: Not on file  Stress: Not on file  Social Connections: Not on file  Intimate Partner Violence: Not on file    Family History  Problem Relation Age of Onset   Hypertension Father    Cancer Mother 73       lung   Stroke Mother    Other Neg Hx        gynecomastia    Current Outpatient Medications  Medication Sig Dispense Refill   ARIPiprazole (ABILIFY) 5 MG tablet Take 5 mg by mouth as needed.     aspirin 81 MG EC tablet Take by mouth.     Cyanocobalamin (VITAMIN B12 PO) Take 1 tablet by mouth daily.     FOLIC ACID PO Take 1 tablet by mouth daily.     L-Methylfolate-B6-B12 (FOLBIC RF) 1.13-25-2 MG TABS      Magnesium Gluconate 550 MG TABS      magnesium oxide (MAG-OX) 400 MG tablet Take 2 tablets by mouth 2 (two) times daily.     meclizine (ANTIVERT) 25 MG tablet Take 25 mg by mouth 2 (two) times daily as needed.     QUEtiapine (SEROQUEL) 100 MG tablet Take 100 mg by mouth in the morning, at noon, and at bedtime.     sertraline (ZOLOFT) 50 MG tablet Take 1 tablet by mouth 2 (two) times daily.     Thiamine HCl (B-1 PO) Take by mouth daily.     traZODone (DESYREL) 150 MG tablet Take 150 mg by mouth as needed.      Current Facility-Administered Medications  Medication Dose Route Frequency Provider Last Rate Last Admin   tamoxifen (NOLVADEX) tablet 10 mg  10 mg Oral Daily Renato Shin, MD        No Known Allergies   REVIEW OF SYSTEMS:  '[X]'$  denotes positive finding, '[ ]'$  denotes negative finding Cardiac  Comments:  Chest pain or chest pressure:    Shortness of breath upon exertion:    Short of  breath when lying flat:    Irregular heart rhythm:        Vascular    Pain in calf, thigh, or hip brought on by ambulation:    Pain in feet at night that wakes you up from your  sleep:     Blood clot in your veins:    Leg swelling:         Pulmonary    Oxygen at home:    Productive cough:     Wheezing:         Neurologic    Sudden weakness in arms or legs:     Sudden numbness in arms or legs:     Sudden onset of difficulty speaking or slurred speech:    Temporary loss of vision in one eye:     Problems with dizziness:         Gastrointestinal    Blood in stool:     Vomited blood:         Genitourinary    Burning when urinating:     Blood in urine:        Psychiatric    Major depression:         Hematologic    Bleeding problems:    Problems with blood clotting too easily:        Skin    Rashes or ulcers:        Constitutional    Fever or chills:      PHYSICAL EXAMINATION:  Vitals:   08/26/21 0950 08/26/21 0958  BP: 133/86 137/76  Pulse: 63   Resp: 16   Temp: 97.8 F (36.6 C)   TempSrc: Temporal   SpO2: 97%   Weight: 138 lb 3.2 oz (62.7 kg)   Height: '5\' 7"'$  (1.702 m)     General:  WDWN in NAD; vital signs documented above Gait: Normal  HENT: WNL, normocephalic Pulmonary: normal non-labored breathing, without wheezing Cardiac: regular HR, without  Murmurs without carotid bruit Abdomen: soft, NT, no masses Vascular Exam/Pulses:  Right Left  Radial 2+ (normal) 2+ (normal)  Femoral 2+ (normal) 2+ (normal)  Popliteal 2+ (normal) 2+ (normal)  DP 2+ (normal) 2+ (normal)  PT 2+ (normal) 2+ (normal)   Extremities: without ischemic changes, without Gangrene , without cellulitis; without open wounds;  Musculoskeletal: no muscle wasting or atrophy  Neurologic: A&O X 3;  No focal weakness or paresthesias are detected Psychiatric:  The pt has Normal affect.   Non-Invasive Vascular Imaging:   Duplex Findings:   +--------------------+--------+--------+--------+--------+  Mesenteric          PSV cm/sEDV cm/s Plaque Comments  +--------------------+--------+--------+--------+--------+  Aorta at SMA           65                             +--------------------+--------+--------+--------+--------+  Celiac Artery Origin  197                             +--------------------+--------+--------+--------+--------+  SMA Proximal          319      55   calcific          +--------------------+--------+--------+--------+--------+  SMA Mid               177      27                     +--------------------+--------+--------+--------+--------+  IMA                   150                             +--------------------+--------+--------+--------+--------+  Summary:  Mesenteric: Normal Celiac artery and Inferior Mesenteric artery findings. Elevated velocities suggest 70 to 99% stenosis in the superior mesenteric artery.      ASSESSMENT/PLAN:: 66 y.o. 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 He remains without any associated symptoms relatable to his SMA stenosis. His duplex today is stable with some elevated velocities in the proximal SMA. This is unchanged. He had some mild carotid stenosis seen on CTA in 2022. He has had no neurological symptoms. No TIA or stroke. His duplex today shows 1-39% bilaterally. Normal flow in subclavian and vertebral arteries. He has no claudication symptoms, rest pain or tissue loss - Continue Aspirin - He will not need carotid duplex for 2 years - He will follow up in 1 year with repeat mesenteric duplex   Karoline Caldwell, PA-C [facility] [phone]  Clinic MD:   [place]

## 2021-08-26 ENCOUNTER — Ambulatory Visit (INDEPENDENT_AMBULATORY_CARE_PROVIDER_SITE_OTHER)
Admission: RE | Admit: 2021-08-26 | Discharge: 2021-08-26 | Disposition: A | Discharge status: Home / Self Care | Payer: Medicare PPO | Source: Ambulatory Visit | Attending: Vascular Surgery | Admitting: Vascular Surgery

## 2021-08-26 ENCOUNTER — Ambulatory Visit (HOSPITAL_COMMUNITY)
Admission: RE | Admit: 2021-08-26 | Discharge: 2021-08-26 | Disposition: A | Discharge status: Home / Self Care | Payer: Medicare PPO | Source: Ambulatory Visit | Attending: Vascular Surgery | Admitting: Vascular Surgery

## 2021-08-26 ENCOUNTER — Ambulatory Visit: Payer: Medicare PPO | Admitting: Physician Assistant

## 2021-08-26 VITALS — BP 137/76 | HR 63 | Temp 97.8°F | Resp 16 | Ht 67.0 in | Wt 138.2 lb

## 2021-08-26 DIAGNOSIS — I6523 Occlusion and stenosis of bilateral carotid arteries: Secondary | ICD-10-CM | POA: Diagnosis not present

## 2021-08-26 DIAGNOSIS — K551 Chronic vascular disorders of intestine: Secondary | ICD-10-CM | POA: Diagnosis not present

## 2021-10-05 DIAGNOSIS — R634 Abnormal weight loss: Secondary | ICD-10-CM | POA: Diagnosis not present

## 2021-10-05 DIAGNOSIS — Z0000 Encounter for general adult medical examination without abnormal findings: Secondary | ICD-10-CM | POA: Diagnosis not present

## 2021-10-05 DIAGNOSIS — Z125 Encounter for screening for malignant neoplasm of prostate: Secondary | ICD-10-CM | POA: Diagnosis not present

## 2021-10-05 DIAGNOSIS — R5383 Other fatigue: Secondary | ICD-10-CM | POA: Diagnosis not present

## 2021-10-05 DIAGNOSIS — R7989 Other specified abnormal findings of blood chemistry: Secondary | ICD-10-CM | POA: Diagnosis not present

## 2021-10-05 DIAGNOSIS — E1169 Type 2 diabetes mellitus with other specified complication: Secondary | ICD-10-CM | POA: Diagnosis not present

## 2021-10-12 ENCOUNTER — Other Ambulatory Visit: Payer: Self-pay | Admitting: Internal Medicine

## 2021-10-12 DIAGNOSIS — R82998 Other abnormal findings in urine: Secondary | ICD-10-CM | POA: Diagnosis not present

## 2021-10-12 DIAGNOSIS — Z1339 Encounter for screening examination for other mental health and behavioral disorders: Secondary | ICD-10-CM | POA: Diagnosis not present

## 2021-10-12 DIAGNOSIS — I739 Peripheral vascular disease, unspecified: Secondary | ICD-10-CM | POA: Diagnosis not present

## 2021-10-12 DIAGNOSIS — I6529 Occlusion and stenosis of unspecified carotid artery: Secondary | ICD-10-CM | POA: Diagnosis not present

## 2021-10-12 DIAGNOSIS — K551 Chronic vascular disorders of intestine: Secondary | ICD-10-CM | POA: Diagnosis not present

## 2021-10-12 DIAGNOSIS — J439 Emphysema, unspecified: Secondary | ICD-10-CM | POA: Diagnosis not present

## 2021-10-12 DIAGNOSIS — I700 Atherosclerosis of aorta: Secondary | ICD-10-CM | POA: Diagnosis not present

## 2021-10-12 DIAGNOSIS — Z1331 Encounter for screening for depression: Secondary | ICD-10-CM | POA: Diagnosis not present

## 2021-10-12 DIAGNOSIS — K7460 Unspecified cirrhosis of liver: Secondary | ICD-10-CM | POA: Diagnosis not present

## 2021-10-12 DIAGNOSIS — Z0000 Encounter for general adult medical examination without abnormal findings: Secondary | ICD-10-CM | POA: Diagnosis not present

## 2021-10-12 DIAGNOSIS — F1010 Alcohol abuse, uncomplicated: Secondary | ICD-10-CM | POA: Diagnosis not present

## 2021-10-12 DIAGNOSIS — E1169 Type 2 diabetes mellitus with other specified complication: Secondary | ICD-10-CM | POA: Diagnosis not present

## 2021-10-12 DIAGNOSIS — I729 Aneurysm of unspecified site: Secondary | ICD-10-CM

## 2021-11-10 ENCOUNTER — Ambulatory Visit
Admission: RE | Admit: 2021-11-10 | Discharge: 2021-11-10 | Disposition: A | Discharge status: Home / Self Care | Payer: Medicare PPO | Source: Ambulatory Visit | Attending: Internal Medicine | Admitting: Internal Medicine

## 2021-11-10 DIAGNOSIS — I6529 Occlusion and stenosis of unspecified carotid artery: Secondary | ICD-10-CM | POA: Diagnosis not present

## 2021-11-10 DIAGNOSIS — I671 Cerebral aneurysm, nonruptured: Secondary | ICD-10-CM | POA: Diagnosis not present

## 2021-11-10 DIAGNOSIS — I729 Aneurysm of unspecified site: Secondary | ICD-10-CM

## 2021-11-10 MED ORDER — IOPAMIDOL (ISOVUE-370) INJECTION 76%
75.0000 mL | Freq: Once | INTRAVENOUS | Status: AC | PRN
  Administered 2021-11-10: 75 mL via INTRAVENOUS

## 2021-11-23 ENCOUNTER — Other Ambulatory Visit: Payer: Self-pay | Admitting: Gastroenterology

## 2021-11-23 DIAGNOSIS — K7460 Unspecified cirrhosis of liver: Secondary | ICD-10-CM

## 2021-12-07 ENCOUNTER — Ambulatory Visit
Admission: RE | Admit: 2021-12-07 | Discharge: 2021-12-07 | Disposition: A | Discharge status: Home / Self Care | Payer: Medicare PPO | Source: Ambulatory Visit | Attending: Gastroenterology | Admitting: Gastroenterology

## 2021-12-07 DIAGNOSIS — Z8719 Personal history of other diseases of the digestive system: Secondary | ICD-10-CM | POA: Diagnosis not present

## 2021-12-07 DIAGNOSIS — K7460 Unspecified cirrhosis of liver: Secondary | ICD-10-CM

## 2022-01-06 DIAGNOSIS — Z86010 Personal history of colonic polyps: Secondary | ICD-10-CM | POA: Diagnosis not present

## 2022-01-06 DIAGNOSIS — K7460 Unspecified cirrhosis of liver: Secondary | ICD-10-CM | POA: Diagnosis not present

## 2022-01-06 DIAGNOSIS — I771 Stricture of artery: Secondary | ICD-10-CM | POA: Diagnosis not present

## 2022-04-01 DIAGNOSIS — R5383 Other fatigue: Secondary | ICD-10-CM | POA: Diagnosis not present

## 2022-04-21 DIAGNOSIS — F419 Anxiety disorder, unspecified: Secondary | ICD-10-CM | POA: Diagnosis not present

## 2022-04-21 DIAGNOSIS — F17200 Nicotine dependence, unspecified, uncomplicated: Secondary | ICD-10-CM | POA: Diagnosis not present

## 2022-04-21 DIAGNOSIS — F5101 Primary insomnia: Secondary | ICD-10-CM | POA: Diagnosis not present

## 2022-04-21 DIAGNOSIS — K7460 Unspecified cirrhosis of liver: Secondary | ICD-10-CM | POA: Diagnosis not present

## 2022-04-21 DIAGNOSIS — F1010 Alcohol abuse, uncomplicated: Secondary | ICD-10-CM | POA: Diagnosis not present

## 2022-04-21 DIAGNOSIS — F339 Major depressive disorder, recurrent, unspecified: Secondary | ICD-10-CM | POA: Diagnosis not present

## 2022-06-30 DIAGNOSIS — N529 Male erectile dysfunction, unspecified: Secondary | ICD-10-CM | POA: Diagnosis not present

## 2022-06-30 DIAGNOSIS — F1010 Alcohol abuse, uncomplicated: Secondary | ICD-10-CM | POA: Diagnosis not present

## 2022-06-30 DIAGNOSIS — J449 Chronic obstructive pulmonary disease, unspecified: Secondary | ICD-10-CM | POA: Diagnosis not present

## 2022-06-30 DIAGNOSIS — F419 Anxiety disorder, unspecified: Secondary | ICD-10-CM | POA: Diagnosis not present

## 2022-06-30 DIAGNOSIS — K7460 Unspecified cirrhosis of liver: Secondary | ICD-10-CM | POA: Diagnosis not present

## 2022-06-30 DIAGNOSIS — Z720 Tobacco use: Secondary | ICD-10-CM | POA: Diagnosis not present

## 2022-08-26 ENCOUNTER — Other Ambulatory Visit: Payer: Self-pay | Admitting: Gastroenterology

## 2022-08-26 DIAGNOSIS — K7460 Unspecified cirrhosis of liver: Secondary | ICD-10-CM

## 2022-09-14 ENCOUNTER — Other Ambulatory Visit: Payer: Medicare PPO

## 2022-10-14 ENCOUNTER — Other Ambulatory Visit (HOSPITAL_COMMUNITY): Payer: Self-pay | Admitting: Internal Medicine

## 2022-10-14 DIAGNOSIS — M5431 Sciatica, right side: Secondary | ICD-10-CM | POA: Diagnosis not present

## 2022-10-14 DIAGNOSIS — K7460 Unspecified cirrhosis of liver: Secondary | ICD-10-CM

## 2022-10-14 DIAGNOSIS — D696 Thrombocytopenia, unspecified: Secondary | ICD-10-CM | POA: Diagnosis not present

## 2022-10-15 ENCOUNTER — Ambulatory Visit (HOSPITAL_COMMUNITY): Payer: Medicare PPO

## 2022-10-22 ENCOUNTER — Other Ambulatory Visit: Payer: Self-pay | Admitting: Internal Medicine

## 2022-10-22 DIAGNOSIS — M5431 Sciatica, right side: Secondary | ICD-10-CM

## 2022-10-25 ENCOUNTER — Ambulatory Visit (HOSPITAL_COMMUNITY)
Admission: RE | Admit: 2022-10-25 | Discharge: 2022-10-25 | Disposition: A | Discharge status: Home / Self Care | Payer: Medicare PPO | Source: Ambulatory Visit | Attending: Internal Medicine | Admitting: Internal Medicine

## 2022-10-25 DIAGNOSIS — K7460 Unspecified cirrhosis of liver: Secondary | ICD-10-CM | POA: Insufficient documentation

## 2022-10-25 DIAGNOSIS — D696 Thrombocytopenia, unspecified: Secondary | ICD-10-CM | POA: Diagnosis not present

## 2022-10-25 DIAGNOSIS — R161 Splenomegaly, not elsewhere classified: Secondary | ICD-10-CM | POA: Diagnosis not present

## 2022-10-25 DIAGNOSIS — K7689 Other specified diseases of liver: Secondary | ICD-10-CM | POA: Diagnosis not present

## 2022-11-01 DIAGNOSIS — E1169 Type 2 diabetes mellitus with other specified complication: Secondary | ICD-10-CM | POA: Diagnosis not present

## 2022-11-01 DIAGNOSIS — E785 Hyperlipidemia, unspecified: Secondary | ICD-10-CM | POA: Diagnosis not present

## 2022-11-01 DIAGNOSIS — Z125 Encounter for screening for malignant neoplasm of prostate: Secondary | ICD-10-CM | POA: Diagnosis not present

## 2022-11-01 DIAGNOSIS — Z79899 Other long term (current) drug therapy: Secondary | ICD-10-CM | POA: Diagnosis not present

## 2022-11-01 DIAGNOSIS — R7989 Other specified abnormal findings of blood chemistry: Secondary | ICD-10-CM | POA: Diagnosis not present

## 2022-11-04 ENCOUNTER — Inpatient Hospital Stay: Payer: Medicare PPO

## 2022-11-04 ENCOUNTER — Inpatient Hospital Stay: Payer: Medicare PPO | Attending: Hematology and Oncology | Admitting: Hematology and Oncology

## 2022-11-08 DIAGNOSIS — R0602 Shortness of breath: Secondary | ICD-10-CM | POA: Diagnosis not present

## 2022-11-08 DIAGNOSIS — F339 Major depressive disorder, recurrent, unspecified: Secondary | ICD-10-CM | POA: Diagnosis not present

## 2022-11-08 DIAGNOSIS — E1169 Type 2 diabetes mellitus with other specified complication: Secondary | ICD-10-CM | POA: Diagnosis not present

## 2022-11-08 DIAGNOSIS — D61818 Other pancytopenia: Secondary | ICD-10-CM | POA: Diagnosis not present

## 2022-11-08 DIAGNOSIS — Z1339 Encounter for screening examination for other mental health and behavioral disorders: Secondary | ICD-10-CM | POA: Diagnosis not present

## 2022-11-08 DIAGNOSIS — J449 Chronic obstructive pulmonary disease, unspecified: Secondary | ICD-10-CM | POA: Diagnosis not present

## 2022-11-08 DIAGNOSIS — E871 Hypo-osmolality and hyponatremia: Secondary | ICD-10-CM | POA: Diagnosis not present

## 2022-11-08 DIAGNOSIS — Z1331 Encounter for screening for depression: Secondary | ICD-10-CM | POA: Diagnosis not present

## 2022-11-08 DIAGNOSIS — F1010 Alcohol abuse, uncomplicated: Secondary | ICD-10-CM | POA: Diagnosis not present

## 2022-11-08 DIAGNOSIS — K551 Chronic vascular disorders of intestine: Secondary | ICD-10-CM | POA: Diagnosis not present

## 2022-11-08 DIAGNOSIS — Z0000 Encounter for general adult medical examination without abnormal findings: Secondary | ICD-10-CM | POA: Diagnosis not present

## 2022-11-10 ENCOUNTER — Ambulatory Visit
Admission: RE | Admit: 2022-11-10 | Discharge: 2022-11-10 | Disposition: A | Discharge status: Home / Self Care | Payer: Medicare PPO | Source: Ambulatory Visit | Attending: Internal Medicine | Admitting: Internal Medicine

## 2022-11-10 DIAGNOSIS — M47816 Spondylosis without myelopathy or radiculopathy, lumbar region: Secondary | ICD-10-CM | POA: Diagnosis not present

## 2022-11-10 DIAGNOSIS — M5431 Sciatica, right side: Secondary | ICD-10-CM

## 2022-11-10 DIAGNOSIS — M48061 Spinal stenosis, lumbar region without neurogenic claudication: Secondary | ICD-10-CM | POA: Diagnosis not present

## 2022-11-22 DIAGNOSIS — F339 Major depressive disorder, recurrent, unspecified: Secondary | ICD-10-CM | POA: Diagnosis not present

## 2022-11-22 DIAGNOSIS — E871 Hypo-osmolality and hyponatremia: Secondary | ICD-10-CM | POA: Diagnosis not present

## 2022-11-22 DIAGNOSIS — D61818 Other pancytopenia: Secondary | ICD-10-CM | POA: Diagnosis not present

## 2022-11-22 DIAGNOSIS — F17200 Nicotine dependence, unspecified, uncomplicated: Secondary | ICD-10-CM | POA: Diagnosis not present

## 2022-11-22 DIAGNOSIS — E1169 Type 2 diabetes mellitus with other specified complication: Secondary | ICD-10-CM | POA: Diagnosis not present

## 2022-11-22 DIAGNOSIS — K7460 Unspecified cirrhosis of liver: Secondary | ICD-10-CM | POA: Diagnosis not present

## 2022-11-22 DIAGNOSIS — J449 Chronic obstructive pulmonary disease, unspecified: Secondary | ICD-10-CM | POA: Diagnosis not present

## 2022-11-22 DIAGNOSIS — F1010 Alcohol abuse, uncomplicated: Secondary | ICD-10-CM | POA: Diagnosis not present

## 2022-11-22 DIAGNOSIS — R0602 Shortness of breath: Secondary | ICD-10-CM | POA: Diagnosis not present

## 2022-12-06 ENCOUNTER — Inpatient Hospital Stay (HOSPITAL_BASED_OUTPATIENT_CLINIC_OR_DEPARTMENT_OTHER): Payer: Medicare PPO | Admitting: Hematology and Oncology

## 2022-12-06 ENCOUNTER — Inpatient Hospital Stay: Payer: Medicare PPO | Attending: Hematology and Oncology

## 2022-12-06 ENCOUNTER — Encounter: Payer: Self-pay | Admitting: Hematology and Oncology

## 2022-12-06 VITALS — BP 163/85 | HR 74 | Temp 97.9°F | Resp 18 | Ht 67.0 in | Wt 141.6 lb

## 2022-12-06 DIAGNOSIS — D539 Nutritional anemia, unspecified: Secondary | ICD-10-CM

## 2022-12-06 DIAGNOSIS — F17210 Nicotine dependence, cigarettes, uncomplicated: Secondary | ICD-10-CM | POA: Insufficient documentation

## 2022-12-06 DIAGNOSIS — Z79899 Other long term (current) drug therapy: Secondary | ICD-10-CM | POA: Diagnosis not present

## 2022-12-06 DIAGNOSIS — F1020 Alcohol dependence, uncomplicated: Secondary | ICD-10-CM

## 2022-12-06 DIAGNOSIS — K7460 Unspecified cirrhosis of liver: Secondary | ICD-10-CM | POA: Insufficient documentation

## 2022-12-06 DIAGNOSIS — Z801 Family history of malignant neoplasm of trachea, bronchus and lung: Secondary | ICD-10-CM

## 2022-12-06 DIAGNOSIS — Z720 Tobacco use: Secondary | ICD-10-CM

## 2022-12-06 DIAGNOSIS — D61818 Other pancytopenia: Secondary | ICD-10-CM | POA: Diagnosis not present

## 2022-12-06 LAB — CBC WITH DIFFERENTIAL (CANCER CENTER ONLY)
Abs Immature Granulocytes: 0.01 10*3/uL (ref 0.00–0.07)
Basophils Absolute: 0 10*3/uL (ref 0.0–0.1)
Basophils Relative: 1 %
Eosinophils Absolute: 0 10*3/uL (ref 0.0–0.5)
Eosinophils Relative: 1 %
HCT: 31.6 % — ABNORMAL LOW (ref 39.0–52.0)
Hemoglobin: 11.3 g/dL — ABNORMAL LOW (ref 13.0–17.0)
Immature Granulocytes: 1 %
Lymphocytes Relative: 22 %
Lymphs Abs: 0.5 10*3/uL — ABNORMAL LOW (ref 0.7–4.0)
MCH: 36.1 pg — ABNORMAL HIGH (ref 26.0–34.0)
MCHC: 35.8 g/dL (ref 30.0–36.0)
MCV: 101 fL — ABNORMAL HIGH (ref 80.0–100.0)
Monocytes Absolute: 0.2 10*3/uL (ref 0.1–1.0)
Monocytes Relative: 9 %
Neutro Abs: 1.4 10*3/uL — ABNORMAL LOW (ref 1.7–7.7)
Neutrophils Relative %: 66 %
Platelet Count: 46 10*3/uL — ABNORMAL LOW (ref 150–400)
RBC: 3.13 MIL/uL — ABNORMAL LOW (ref 4.22–5.81)
RDW: 11.9 % (ref 11.5–15.5)
Smear Review: DECREASED
WBC Count: 2.1 10*3/uL — ABNORMAL LOW (ref 4.0–10.5)
nRBC: 0 % (ref 0.0–0.2)

## 2022-12-06 LAB — RETICULOCYTES
Immature Retic Fract: 6.6 % (ref 2.3–15.9)
RBC.: 3.12 MIL/uL — ABNORMAL LOW (ref 4.22–5.81)
Retic Count, Absolute: 59 10*3/uL (ref 19.0–186.0)
Retic Ct Pct: 1.9 % (ref 0.4–3.1)

## 2022-12-06 LAB — CMP (CANCER CENTER ONLY)
ALT: 48 U/L — ABNORMAL HIGH (ref 0–44)
AST: 127 U/L — ABNORMAL HIGH (ref 15–41)
Albumin: 3.9 g/dL (ref 3.5–5.0)
Alkaline Phosphatase: 58 U/L (ref 38–126)
Anion gap: 6 (ref 5–15)
BUN: 6 mg/dL — ABNORMAL LOW (ref 8–23)
CO2: 28 mmol/L (ref 22–32)
Calcium: 9.2 mg/dL (ref 8.9–10.3)
Chloride: 93 mmol/L — ABNORMAL LOW (ref 98–111)
Creatinine: 0.67 mg/dL (ref 0.61–1.24)
GFR, Estimated: >60 mL/min (ref >60)
Glucose, Bld: 78 mg/dL (ref 70–99)
Potassium: 3.9 mmol/L (ref 3.5–5.1)
Sodium: 127 mmol/L — ABNORMAL LOW (ref 135–145)
Total Bilirubin: 0.9 mg/dL (ref 0.3–1.2)
Total Protein: 7.4 g/dL (ref 6.5–8.1)

## 2022-12-06 LAB — IRON AND IRON BINDING CAPACITY (CC-WL,HP ONLY)
Iron: 146 ug/dL (ref 45–182)
Saturation Ratios: 55 % — ABNORMAL HIGH (ref 17.9–39.5)
TIBC: 265 ug/dL (ref 250–450)
UIBC: 119 ug/dL (ref 117–376)

## 2022-12-06 LAB — LACTATE DEHYDROGENASE: LDH: 136 U/L (ref 98–192)

## 2022-12-06 LAB — PROTIME-INR
INR: 1.1 (ref 0.8–1.2)
Prothrombin Time: 14.9 seconds (ref 11.4–15.2)

## 2022-12-06 LAB — ABO/RH: ABO/RH(D): A POS

## 2022-12-06 LAB — FIBRINOGEN: Fibrinogen: 251 mg/dL (ref 210–475)

## 2022-12-06 LAB — APTT: aPTT: 27 seconds (ref 24–36)

## 2022-12-06 LAB — SEDIMENTATION RATE: Sed Rate: 20 mm/hr — ABNORMAL HIGH (ref 0–16)

## 2022-12-06 LAB — VITAMIN B12: Vitamin B-12: 807 pg/mL (ref 180–914)

## 2022-12-06 NOTE — Progress Notes (Signed)
[facility] CONSULT NOTE  Patient Care Team: Jared Penna, MD as PCP - General (Internal Medicine) Jared Moment, MD as Referring Physician (Hematology and Oncology)  ASSESSMENT & PLAN:  Deficiency anemia The patient is known to have chronic alcohol dependency and chronic pancytopenia His pancytopenia is likely worse now compared to his baseline It is likely that there is a component malnutrition given his poor dietary intake I will order additional labs for further review If blood work is unrevealing, he can benefit from imaging study before we proceed with bone marrow aspirate and biopsy  Alcohol use disorder, severe, dependence (HCC) He has significant alcohol dependency and is not willing to quit We discussed importance of staying abstinent from alcohol as it is worsening his pancytopenia  Tobacco abuse I spent some time counseling the patient the importance of tobacco cessation. We discussed common tobacco cessation strategies He is currently not interested to quit now.  Orders Placed This Encounter  Procedures   CBC with Differential (Cancer Center Only)    Standing Status:   Future    Standing Expiration Date:   12/06/2023   CMP (Cancer Center only)    Standing Status:   Future    Standing Expiration Date:   12/06/2023   Vitamin B12    Standing Status:   Future    Standing Expiration Date:   12/06/2023   Sedimentation rate    Standing Status:   Future    Standing Expiration Date:   12/06/2023   Reticulocytes    Standing Status:   Future    Standing Expiration Date:   12/06/2023   Ferritin    Standing Status:   Future    Standing Expiration Date:   12/06/2023   Iron and Iron Binding Capacity ([facility] only)    Standing Status:   Future    Standing Expiration Date:   12/06/2023   Lactate dehydrogenase    Standing Status:   Future    Standing Expiration Date:   12/06/2023   Erythropoietin    Standing Status:   Future    Standing Expiration Date:    12/06/2023   Protime-INR    Standing Status:   Future    Standing Expiration Date:   12/06/2023   Copper, serum    Standing Status:   Future    Standing Expiration Date:   12/06/2023   Vitamin B1    Standing Status:   Future    Standing Expiration Date:   12/06/2023   AFP tumor marker    Standing Status:   Future    Standing Expiration Date:   12/06/2023   APTT    Standing Status:   Future    Standing Expiration Date:   12/06/2023   Fibrinogen    Standing Status:   Future    Standing Expiration Date:   12/06/2023   ABO/Rh    Standing Status:   Future    Standing Expiration Date:   12/06/2023    All questions were answered. The patient knows to call the clinic with any problems, questions or concerns.  The total time spent in the appointment was 60 minutes encounter with patients including review of chart and various tests results, discussions about plan of care and coordination of care plan  Jared Delay, MD 9/23/20242:54 PM   CHIEF COMPLAINTS/PURPOSE OF CONSULTATION:  Worsening pancytopenia  HISTORY OF PRESENTING ILLNESS:  [name] 67 y.o. male is here because of worsening pancytopenia He is here accompanied by his wife,  [name] The patient is noted to have background history of significant alcohol dependency with alcoholic liver cirrhosis and splenomegaly The patient started drinking when he is around 63 or 67 years old and he has been consistently drinking 6 cans of beer daily.  He has quit at some point in time for about 2 years but resumed drinking I was able to review his electronic records dated back to 2009 On June 09, 2007, his white count was 8.8, hemoglobin 7.0 and platelet count of 292 Over the years, he had several admissions to the hospital At 1 point, his platelet count was as low as 16 The last CBC that I can review from electronic records is from 11/12/2019.  His white blood cell count was 2.5, hemoglobin 12 and platelet count of 75 Most recently, he had  repeat labs done with his primary care doctor on November 01, 2022 His white blood cell count was 1.4, hemoglobin 10.8, MCV of 112.1 and platelet count of 40 Total bilirubin was 0.7, ALT 49 and AST 155  He denies recurrent infection.  He has not needed antibiotics for some time. He has chronic fatigue. He denies recent chest pain on exertion, shortness of breath on minimal exertion, pre-syncopal episodes, or palpitations. He had not noticed any recent bleeding such as epistaxis, hematuria or hematochezia The patient denies over the counter NSAID ingestion. He is not on antiplatelets agents. He had gastroenterology evaluation with EGD on August 15, 2019.  He was noted to have portal gastropathy.  I did not see any prior colonoscopy done. He has very poor oral intake.  He only eats once a day at dinnertime and in general, would eat something simple such as a hot dog.  He is never hungry. He smoked excessively.  His wife thought he smoked at least 1-1/2 pack of cigarettes per day but he stated he has only been smoking 1 pack of cigarettes per day.  He has been smoking since he was 67 years old.  He quit for 10 years many years ago but then resumed smoking and currently not interested to quit  MEDICAL HISTORY:  Past Medical History:  Diagnosis Date   Alcohol use disorder, severe, dependence (HCC) 02/16/2015   Anemia    Anxiety    Arthritis 08/16/2012   generalized arthritis   Cirrhosis (HCC)    Cirrhosis of liver (HCC)    Clotting disorder (HCC)    Diabetes mellitus without complication (HCC)    Foreign body 08/16/2012   nose"BB" pellet since age 13. remains.   GERD (gastroesophageal reflux disease)    History of ETOH abuse 08/16/2012   Heavy alcohol use daily   Pancreatitis 08/16/2012   at present weakness,fatiques easily   Platelets decreased [facility])    Was told by Dr. Hillery Jacks a little low"-not sure how low per spouse -[name]   Substance abuse [facility])    Tobacco abuse 12/10/2019    Transfusion history 08/16/2012   '99   Wernicke encephalopathy     SURGICAL HISTORY: Past Surgical History:  Procedure Laterality Date   COLONOSCOPY     ESOPHAGOGASTRODUODENOSCOPY     ESOPHAGOGASTRODUODENOSCOPY N/A 08/15/2019   Procedure: ESOPHAGOGASTRODUODENOSCOPY (EGD);  Surgeon: Willis Modena, MD;  Location[facility];  Service: Endoscopy;  Laterality: N/A;  Pt will need 1 bag of pheresed platelets pre-procedure   ESOPHAGOGASTRODUODENOSCOPY (EGD) WITH PROPOFOL N/A 02/11/2016   Procedure: ESOPHAGOGASTRODUODENOSCOPY (EGD) WITH PROPOFOL;  Surgeon: Willis Modena, MD;  [facility];  Service: Endoscopy;  Laterality: N/A;  EUS N/A 08/18/2012   Procedure: UPPER ENDOSCOPIC ULTRASOUND (EUS) LINEAR;  Surgeon: Theda Belfast, MD;  [facility];  Service: Endoscopy;  Laterality: N/A;   INSERTION OF MESH N/A 06/18/2015   Procedure: INSERTION OF MESH;  Surgeon: Franky Macho, MD;  [facility];  Service: General;  Laterality: N/A;   NECK SURGERY     fusion of neck-titanium hardware inplaced   NECK SURGERY  0623,7628   UMBILICAL HERNIA REPAIR N/A 06/18/2015   Procedure: HERNIA REPAIR UMBILICAL ADULT WITH MESH;  Surgeon: Franky Macho, MD;  Location: [facility];  Service: General;  Laterality: N/A;    SOCIAL HISTORY: Social History   Socioeconomic History   Marital status: Married    Spouse name: Harriett   Number of children: 2   Years of education: 9   Highest education level: Not on file  Occupational History    Comment: disabled  Tobacco Use   Smoking status: Every Day    Current packs/day: 1.00    Average packs/day: 1 pack/day for 40.0 years (40.0 ttl pk-yrs)    Types: Cigarettes   Smokeless tobacco: Never   Tobacco comments:    Quit, now 6-7 cigs per day.  Vaping Use   Vaping status: Never Used  Substance and Sexual Activity   Alcohol use: Not Currently    Alcohol/week: 6.0 standard drinks of alcohol    Types: 6 Cans of beer per week    Comment: 12 cans  beer per day, quit 01/2015, pt states he drinks 6 beers a week   Drug use: No    Comment: 03/2015 quit 20 yrs ago   Sexual activity: Yes  Other Topics Concern   Not on file  Social History Narrative   Lives at home with wife   Caffeine use- sodas 2 a day   Social Determinants of Health   Financial Resource Strain: Not on file  Food Insecurity: Not on file  Transportation Needs: Not on file  Physical Activity: Not on file  Stress: Not on file  Social Connections: Not on file  Intimate Partner Violence: Not on file    FAMILY HISTORY: Family History  Problem Relation Age of Onset   Hypertension Father    Cancer Mother 39       lung   Stroke Mother    Other Neg Hx        gynecomastia    ALLERGIES:  has No Known Allergies.  MEDICATIONS:  Current Outpatient Medications  Medication Sig Dispense Refill   sertraline (ZOLOFT) 100 MG tablet Take 100 mg by mouth daily.     sildenafil (VIAGRA) 50 MG tablet 1 tablet as needed Orally Once a day for 30 days     thiamine (VITAMIN B1) 100 MG tablet Take 100 mg by mouth daily.     aspirin 81 MG EC tablet Take by mouth.     busPIRone (BUSPAR) 7.5 MG tablet Take 7.5 mg by mouth 2 (two) times daily.     Cobalamin Combinations (B12 FOLATE) 800-800 MCG CAPS as directed Orally     Cyanocobalamin (VITAMIN B12 PO) Take 1 tablet by mouth daily.     FOLIC ACID PO Take 1 tablet by mouth daily.     magnesium oxide (MAG-OX) 400 MG tablet Take 2 tablets by mouth 2 (two) times daily.     Thiamine HCl (B-1 PO) Take by mouth daily.     traZODone (DESYREL) 100 MG tablet Take 100 mg by mouth at bedtime.  No current facility-administered medications for this visit.    REVIEW OF SYSTEMS:   All other systems were reviewed with the patient and are negative.  PHYSICAL EXAMINATION: ECOG PERFORMANCE STATUS: 2 - Symptomatic, <50% confined to bed  Vitals:   12/06/22 1435  BP: (!) 163/85  Pulse: 74  Resp: 18  Temp: 97.9 F (36.6 C)  SpO2: 100%    Filed Weights   12/06/22 1435  Weight: 141 lb 9.6 oz (64.2 kg)    GENERAL:alert, no distress and comfortable SKIN: Noted tanned skin complexity.  Noted old bruises EYES: normal, conjunctiva are pink and non-injected, sclera clear OROPHARYNX:no exudate, no erythema and lips, buccal mucosa, and tongue normal  NECK: supple, thyroid normal size, non-tender, without nodularity LYMPH:  no palpable lymphadenopathy in the cervical, axillary or inguinal LUNGS: clear to auscultation and percussion with normal breathing effort HEART: regular rate & rhythm and no murmurs and no lower extremity edema ABDOMEN:abdomen soft, palpable hepatosplenomegaly  Musculoskeletal:no cyanosis of digits and no clubbing  PSYCH: alert & oriented x 3 with fluent speech NEURO: no focal motor/sensory deficits

## 2022-12-06 NOTE — Assessment & Plan Note (Signed)
I spent some time counseling the patient the importance of tobacco cessation. We discussed common tobacco cessation strategies He is currently not interested to quit now.

## 2022-12-06 NOTE — Assessment & Plan Note (Signed)
The patient is known to have chronic alcohol dependency and chronic pancytopenia His pancytopenia is likely worse now compared to his baseline It is likely that there is a component malnutrition given his poor dietary intake I will order additional labs for further review If blood work is unrevealing, he can benefit from imaging study before we proceed with bone marrow aspirate and biopsy

## 2022-12-06 NOTE — Assessment & Plan Note (Signed)
He has significant alcohol dependency and is not willing to quit We discussed importance of staying abstinent from alcohol as it is worsening his pancytopenia

## 2022-12-07 LAB — FERRITIN: Ferritin: 453 ng/mL — ABNORMAL HIGH (ref 24–336)

## 2022-12-07 LAB — AFP TUMOR MARKER: AFP, Serum, Tumor Marker: 4.8 ng/mL (ref 0.0–8.4)

## 2022-12-07 LAB — ERYTHROPOIETIN: Erythropoietin: 13.4 m[IU]/mL (ref 2.6–18.5)

## 2022-12-08 LAB — COPPER, SERUM: Copper: 66 ug/dL — ABNORMAL LOW (ref 69–132)

## 2022-12-09 LAB — VITAMIN B1: Vitamin B1 (Thiamine): 81.9 nmol/L (ref 66.5–200.0)

## 2022-12-10 DIAGNOSIS — M5431 Sciatica, right side: Secondary | ICD-10-CM | POA: Diagnosis not present

## 2022-12-10 DIAGNOSIS — M48062 Spinal stenosis, lumbar region with neurogenic claudication: Secondary | ICD-10-CM | POA: Diagnosis not present

## 2022-12-14 ENCOUNTER — Encounter: Payer: Self-pay | Admitting: Hematology and Oncology

## 2022-12-14 ENCOUNTER — Inpatient Hospital Stay: Payer: Medicare PPO | Attending: Hematology and Oncology | Admitting: Hematology and Oncology

## 2022-12-14 VITALS — BP 144/80 | HR 71 | Temp 97.4°F | Resp 18 | Ht 67.0 in | Wt 143.6 lb

## 2022-12-14 DIAGNOSIS — Z720 Tobacco use: Secondary | ICD-10-CM | POA: Diagnosis not present

## 2022-12-14 DIAGNOSIS — R5382 Chronic fatigue, unspecified: Secondary | ICD-10-CM | POA: Diagnosis not present

## 2022-12-14 DIAGNOSIS — K7031 Alcoholic cirrhosis of liver with ascites: Secondary | ICD-10-CM

## 2022-12-14 DIAGNOSIS — K7030 Alcoholic cirrhosis of liver without ascites: Secondary | ICD-10-CM

## 2022-12-14 DIAGNOSIS — D61818 Other pancytopenia: Secondary | ICD-10-CM | POA: Insufficient documentation

## 2022-12-14 DIAGNOSIS — Z87891 Personal history of nicotine dependence: Secondary | ICD-10-CM | POA: Diagnosis not present

## 2022-12-14 DIAGNOSIS — F1090 Alcohol use, unspecified, uncomplicated: Secondary | ICD-10-CM | POA: Insufficient documentation

## 2022-12-14 DIAGNOSIS — Z79899 Other long term (current) drug therapy: Secondary | ICD-10-CM | POA: Diagnosis not present

## 2022-12-14 DIAGNOSIS — D539 Nutritional anemia, unspecified: Secondary | ICD-10-CM | POA: Diagnosis not present

## 2022-12-14 NOTE — Progress Notes (Signed)
[facility] OFFICE PROGRESS NOTE  Patient Care Team: Alysia Penna, MD as PCP - General (Internal Medicine)  HISTORY OF PRESENTING ILLNESS: Discussed the use of AI scribe software for clinical note transcription with the patient, who gave verbal consent to proceed.  History of Present Illness   The patient, with a history of chronic alcoholism, presented with evaluation for significant decrease in white blood cell count, red blood cell count, and platelets over the past five years. The patient reported consuming alcohol regularly and eating only one meal a day. Despite the low blood cell counts, the patient's iron and vitamin B1 levels were found to be normal. However, the copper level was borderline low. The patient also reported frequent bleeding.  The patient has a history of smoking and had a CT scan of the chest a year ago, which showed some scarring. The patient did not report any new symptoms related to this condition. The patient's liver function was found to be slightly impaired, but still functioning adequately. The patient did not report any new symptoms related to liver function.  The patient's bone marrow function was found to be profoundly suppressed, which was attributed to chronic alcohol consumption. The patient did not express any desire for further investigation into this matter, such as a bone marrow biopsy.         Assessment and Plan    Alcohol-induced Pancytopenia Profound suppression of bone marrow due to excessive alcohol intake. White blood cell count, red blood cell count, and platelet count all significantly lower compared to previous years. Copper level borderline low. Iron, thiamine (B1), and B12 levels normal. No current need for transfusion. -Encouraged cessation of alcohol intake. -Recommended over-the-counter copper supplement or increased intake of copper-rich foods. -Plan to monitor closely.  Liver Function Mildly elevated liver tests,  likely secondary to alcohol intake. Clotting function currently normal. -Encouraged cessation of alcohol intake. -Continue to monitor liver function.  Long term tobacco abuse History of long-term smoking. Last CT scan of chest in March 2023 showed scarring but no signs of cancer. -Order annual lung cancer screening CT scan, overdue now. -Schedule follow-up appointment in two weeks to review CT scan results.          Orders Placed This Encounter  Procedures   CT CHEST LUNG CA SCREEN LOW DOSE W/O CM    Standing Status:   Future    Standing Expiration Date:   12/14/2023    Order Specific Question:   Preferred Imaging Location?    Answer:   [facility]   CMP (Cancer Center only)    Standing Status:   Future    Standing Expiration Date:   12/14/2023   CBC with Differential (Cancer Center Only)    Standing Status:   Future    Standing Expiration Date:   12/14/2023   Copper, serum    Standing Status:   Future    Standing Expiration Date:   12/14/2023    All questions were answered. The patient knows to call the clinic with any problems, questions or concerns. The total time spent in the appointment was 30 minutes encounter with patients including review of chart and various tests results, discussions about plan of care and coordination of care plan   Jared Delay, MD 12/14/2022 10:22 AM  REVIEW OF SYSTEMS:   Constitutional: Denies fevers, chills or abnormal weight loss Eyes: Denies blurriness of vision Ears, nose, mouth, throat, and face: Denies mucositis or sore throat Respiratory: Denies cough, dyspnea  or wheezes Cardiovascular: Denies palpitation, chest discomfort or lower extremity swelling Gastrointestinal:  Denies nausea, heartburn or change in bowel habits Skin: Denies abnormal skin rashes Lymphatics: Denies new lymphadenopathy or easy bruising Neurological:Denies numbness, tingling or new weaknesses Behavioral/Psych: Mood is stable, no new changes  All other systems  were reviewed with the patient and are negative.  I have reviewed the past medical history, past surgical history, social history and family history with the patient and they are unchanged from previous note.  ALLERGIES:  has No Known Allergies.  MEDICATIONS:  Current Outpatient Medications  Medication Sig Dispense Refill   aspirin 81 MG EC tablet Take by mouth.     busPIRone (BUSPAR) 7.5 MG tablet Take 7.5 mg by mouth 2 (two) times daily.     Cobalamin Combinations (B12 FOLATE) 800-800 MCG CAPS as directed Orally     Cyanocobalamin (VITAMIN B12 PO) Take 1 tablet by mouth daily.     FOLIC ACID PO Take 1 tablet by mouth daily.     magnesium oxide (MAG-OX) 400 MG tablet Take 2 tablets by mouth 2 (two) times daily.     sertraline (ZOLOFT) 100 MG tablet Take 100 mg by mouth daily.     sildenafil (VIAGRA) 50 MG tablet 1 tablet as needed Orally Once a day for 30 days     thiamine (VITAMIN B1) 100 MG tablet Take 100 mg by mouth daily.     Thiamine HCl (B-1 PO) Take by mouth daily.     traZODone (DESYREL) 100 MG tablet Take 100 mg by mouth at bedtime.     No current facility-administered medications for this visit.    SUMMARY OF HEMATOLOGIC HISTORY:  [name] 67 y.o. male is here because of worsening pancytopenia He is here accompanied by his wife, [name] The patient is noted to have background history of significant alcohol dependency with alcoholic liver cirrhosis and splenomegaly The patient started drinking when he is around 19 or 67 years old and he has been consistently drinking 6 cans of beer daily.  He has quit at some point in time for about 2 years but resumed drinking I was able to review his electronic records dated back to 2009 On June 09, 2007, his white count was 8.8, hemoglobin 7.0 and platelet count of 292 Over the years, he had several admissions to the hospital At 1 point, his platelet count was as low as 16 The last CBC that I can review from electronic records  is from 11/12/2019.  His white blood cell count was 2.5, hemoglobin 12 and platelet count of 75 Most recently, he had repeat labs done with his primary care doctor on November 01, 2022 His white blood cell count was 1.4, hemoglobin 10.8, MCV of 112.1 and platelet count of 40 Total bilirubin was 0.7, ALT 49 and AST 155  He denies recurrent infection.  He has not needed antibiotics for some time. He has chronic fatigue. He denies recent chest pain on exertion, shortness of breath on minimal exertion, pre-syncopal episodes, or palpitations. He had not noticed any recent bleeding such as epistaxis, hematuria or hematochezia The patient denies over the counter NSAID ingestion. He is not on antiplatelets agents. He had gastroenterology evaluation with EGD on August 15, 2019.  He was noted to have portal gastropathy.  I did not see any prior colonoscopy done. He has very poor oral intake.  He only eats once a day at dinnertime and in general, would eat something simple such as a  hot dog.  He is never hungry. He smoked excessively.  His wife thought he smoked at least 1-1/2 pack of cigarettes per day but he stated he has only been smoking 1 pack of cigarettes per day.  He has been smoking since he was 67 years old.  He quit for 10 years many years ago but then resumed smoking and currently not interested to quit   PHYSICAL EXAMINATION: ECOG PERFORMANCE STATUS: 0 - Asymptomatic  Vitals:   12/14/22 0916  BP: (!) 144/80  Pulse: 71  Resp: 18  Temp: (!) 97.4 F (36.3 C)  SpO2: 100%   Filed Weights   12/14/22 0916  Weight: 143 lb 9.6 oz (65.1 kg)    GENERAL:alert, no distress and comfortable   LABORATORY DATA:  I have reviewed the data as listed    Component Value Date/Time   NA 127 (L) 12/06/2022 1502   NA 136 06/24/2015 1432   K 3.9 12/06/2022 1502   CL 93 (L) 12/06/2022 1502   CO2 28 12/06/2022 1502   GLUCOSE 78 12/06/2022 1502   BUN 6 (L) 12/06/2022 1502   BUN 35 (H) 06/24/2015 1432    CREATININE 0.67 12/06/2022 1502   CALCIUM 9.2 12/06/2022 1502   PROT 7.4 12/06/2022 1502   PROT 8.7 (H) 06/24/2015 1432   ALBUMIN 3.9 12/06/2022 1502   ALBUMIN 4.3 06/24/2015 1432   AST 127 (H) 12/06/2022 1502   ALT 48 (H) 12/06/2022 1502   ALKPHOS 58 12/06/2022 1502   BILITOT 0.9 12/06/2022 1502   GFRNONAA >60 12/06/2022 1502   GFRAA >60 11/12/2019 1418    No results found for: "SPEP", "UPEP"  Lab Results  Component Value Date   WBC 2.1 (L) 12/06/2022   NEUTROABS 1.4 (L) 12/06/2022   HGB 11.3 (L) 12/06/2022   HCT 31.6 (L) 12/06/2022   MCV 101.0 (H) 12/06/2022   PLT 46 (L) 12/06/2022      Chemistry      Component Value Date/Time   NA 127 (L) 12/06/2022 1502   NA 136 06/24/2015 1432   K 3.9 12/06/2022 1502   CL 93 (L) 12/06/2022 1502   CO2 28 12/06/2022 1502   BUN 6 (L) 12/06/2022 1502   BUN 35 (H) 06/24/2015 1432   CREATININE 0.67 12/06/2022 1502      Component Value Date/Time   CALCIUM 9.2 12/06/2022 1502   ALKPHOS 58 12/06/2022 1502   AST 127 (H) 12/06/2022 1502   ALT 48 (H) 12/06/2022 1502   BILITOT 0.9 12/06/2022 1502

## 2022-12-16 ENCOUNTER — Telehealth: Payer: Self-pay

## 2022-12-16 DIAGNOSIS — M48062 Spinal stenosis, lumbar region with neurogenic claudication: Secondary | ICD-10-CM | POA: Diagnosis not present

## 2022-12-16 DIAGNOSIS — M4322 Fusion of spine, cervical region: Secondary | ICD-10-CM | POA: Diagnosis not present

## 2022-12-16 DIAGNOSIS — M5416 Radiculopathy, lumbar region: Secondary | ICD-10-CM | POA: Diagnosis not present

## 2022-12-16 NOTE — Telephone Encounter (Signed)
Pt's spouse called stating that she needs a letter for pt to receive an inj in his back due to his plt count. This RN called back and LVM to seek further clarification. Call back number [phone] provided.

## 2022-12-22 DIAGNOSIS — F339 Major depressive disorder, recurrent, unspecified: Secondary | ICD-10-CM | POA: Diagnosis not present

## 2022-12-22 DIAGNOSIS — J449 Chronic obstructive pulmonary disease, unspecified: Secondary | ICD-10-CM | POA: Diagnosis not present

## 2022-12-22 DIAGNOSIS — F17200 Nicotine dependence, unspecified, uncomplicated: Secondary | ICD-10-CM | POA: Diagnosis not present

## 2022-12-22 DIAGNOSIS — E1169 Type 2 diabetes mellitus with other specified complication: Secondary | ICD-10-CM | POA: Diagnosis not present

## 2022-12-22 DIAGNOSIS — E871 Hypo-osmolality and hyponatremia: Secondary | ICD-10-CM | POA: Diagnosis not present

## 2022-12-22 DIAGNOSIS — D61818 Other pancytopenia: Secondary | ICD-10-CM | POA: Diagnosis not present

## 2022-12-22 DIAGNOSIS — K7460 Unspecified cirrhosis of liver: Secondary | ICD-10-CM | POA: Diagnosis not present

## 2022-12-22 DIAGNOSIS — F1010 Alcohol abuse, uncomplicated: Secondary | ICD-10-CM | POA: Diagnosis not present

## 2022-12-24 ENCOUNTER — Encounter (HOSPITAL_COMMUNITY): Payer: Self-pay

## 2022-12-24 ENCOUNTER — Ambulatory Visit (HOSPITAL_COMMUNITY)
Admission: RE | Admit: 2022-12-24 | Discharge: 2022-12-24 | Disposition: A | Discharge status: Home / Self Care | Payer: Medicare PPO | Source: Ambulatory Visit | Attending: Hematology and Oncology | Admitting: Hematology and Oncology

## 2022-12-24 DIAGNOSIS — F17210 Nicotine dependence, cigarettes, uncomplicated: Secondary | ICD-10-CM | POA: Insufficient documentation

## 2022-12-24 DIAGNOSIS — I10 Essential (primary) hypertension: Secondary | ICD-10-CM | POA: Insufficient documentation

## 2022-12-24 DIAGNOSIS — Z720 Tobacco use: Secondary | ICD-10-CM | POA: Insufficient documentation

## 2022-12-24 DIAGNOSIS — Z87891 Personal history of nicotine dependence: Secondary | ICD-10-CM | POA: Diagnosis not present

## 2022-12-24 DIAGNOSIS — J439 Emphysema, unspecified: Secondary | ICD-10-CM | POA: Insufficient documentation

## 2022-12-24 DIAGNOSIS — I700 Atherosclerosis of aorta: Secondary | ICD-10-CM | POA: Diagnosis not present

## 2022-12-24 DIAGNOSIS — Z122 Encounter for screening for malignant neoplasm of respiratory organs: Secondary | ICD-10-CM | POA: Insufficient documentation

## 2022-12-24 DIAGNOSIS — I2510 Atherosclerotic heart disease of native coronary artery without angina pectoris: Secondary | ICD-10-CM | POA: Insufficient documentation

## 2022-12-27 ENCOUNTER — Telehealth: Payer: Self-pay

## 2022-12-27 NOTE — Telephone Encounter (Addendum)
Returned [name], wife call about upcoming appt. No voicemail and no answer.

## 2022-12-30 ENCOUNTER — Inpatient Hospital Stay: Payer: Medicare PPO | Admitting: Hematology and Oncology

## 2022-12-30 ENCOUNTER — Encounter: Payer: Self-pay | Admitting: Hematology and Oncology

## 2023-01-19 DIAGNOSIS — I8510 Secondary esophageal varices without bleeding: Secondary | ICD-10-CM | POA: Diagnosis not present

## 2023-01-19 DIAGNOSIS — K766 Portal hypertension: Secondary | ICD-10-CM | POA: Diagnosis not present

## 2023-01-19 DIAGNOSIS — F1010 Alcohol abuse, uncomplicated: Secondary | ICD-10-CM | POA: Diagnosis not present

## 2023-01-19 DIAGNOSIS — K3189 Other diseases of stomach and duodenum: Secondary | ICD-10-CM | POA: Diagnosis not present

## 2023-01-19 DIAGNOSIS — R748 Abnormal levels of other serum enzymes: Secondary | ICD-10-CM | POA: Diagnosis not present

## 2023-01-19 DIAGNOSIS — K769 Liver disease, unspecified: Secondary | ICD-10-CM | POA: Diagnosis not present

## 2023-01-19 DIAGNOSIS — K7030 Alcoholic cirrhosis of liver without ascites: Secondary | ICD-10-CM | POA: Diagnosis not present

## 2023-04-18 DIAGNOSIS — J449 Chronic obstructive pulmonary disease, unspecified: Secondary | ICD-10-CM | POA: Diagnosis not present

## 2023-04-18 DIAGNOSIS — D61818 Other pancytopenia: Secondary | ICD-10-CM | POA: Diagnosis not present

## 2023-04-18 DIAGNOSIS — K7460 Unspecified cirrhosis of liver: Secondary | ICD-10-CM | POA: Diagnosis not present

## 2023-04-18 DIAGNOSIS — F339 Major depressive disorder, recurrent, unspecified: Secondary | ICD-10-CM | POA: Diagnosis not present

## 2023-04-18 DIAGNOSIS — I8500 Esophageal varices without bleeding: Secondary | ICD-10-CM | POA: Diagnosis not present

## 2023-04-18 DIAGNOSIS — I2584 Coronary atherosclerosis due to calcified coronary lesion: Secondary | ICD-10-CM | POA: Diagnosis not present

## 2023-04-18 DIAGNOSIS — F1010 Alcohol abuse, uncomplicated: Secondary | ICD-10-CM | POA: Diagnosis not present

## 2023-04-18 DIAGNOSIS — E1169 Type 2 diabetes mellitus with other specified complication: Secondary | ICD-10-CM | POA: Diagnosis not present

## 2023-04-18 DIAGNOSIS — R0602 Shortness of breath: Secondary | ICD-10-CM | POA: Diagnosis not present

## 2023-05-11 ENCOUNTER — Other Ambulatory Visit: Payer: Self-pay | Admitting: Gastroenterology

## 2023-05-11 DIAGNOSIS — K7460 Unspecified cirrhosis of liver: Secondary | ICD-10-CM

## 2023-05-12 DIAGNOSIS — M25531 Pain in right wrist: Secondary | ICD-10-CM | POA: Diagnosis not present

## 2023-05-12 DIAGNOSIS — S6291XA Unspecified fracture of right wrist and hand, initial encounter for closed fracture: Secondary | ICD-10-CM | POA: Diagnosis not present

## 2023-05-17 ENCOUNTER — Ambulatory Visit
Admission: RE | Admit: 2023-05-17 | Discharge: 2023-05-17 | Disposition: A | Discharge status: Home / Self Care | Payer: Medicare PPO | Source: Ambulatory Visit | Attending: Gastroenterology | Admitting: Gastroenterology

## 2023-05-17 DIAGNOSIS — K7460 Unspecified cirrhosis of liver: Secondary | ICD-10-CM

## 2023-05-17 DIAGNOSIS — K7689 Other specified diseases of liver: Secondary | ICD-10-CM | POA: Diagnosis not present

## 2023-07-18 DIAGNOSIS — Z860100 Personal history of colon polyps, unspecified: Secondary | ICD-10-CM | POA: Diagnosis not present

## 2023-07-18 DIAGNOSIS — K7460 Unspecified cirrhosis of liver: Secondary | ICD-10-CM | POA: Diagnosis not present

## 2023-07-18 DIAGNOSIS — D61818 Other pancytopenia: Secondary | ICD-10-CM | POA: Diagnosis not present

## 2023-07-18 DIAGNOSIS — F1099 Alcohol use, unspecified with unspecified alcohol-induced disorder: Secondary | ICD-10-CM | POA: Diagnosis not present

## 2023-11-09 DIAGNOSIS — E1169 Type 2 diabetes mellitus with other specified complication: Secondary | ICD-10-CM | POA: Diagnosis not present

## 2023-11-09 DIAGNOSIS — Z125 Encounter for screening for malignant neoplasm of prostate: Secondary | ICD-10-CM | POA: Diagnosis not present

## 2023-11-09 DIAGNOSIS — R946 Abnormal results of thyroid function studies: Secondary | ICD-10-CM | POA: Diagnosis not present

## 2023-11-09 DIAGNOSIS — Z1389 Encounter for screening for other disorder: Secondary | ICD-10-CM | POA: Diagnosis not present

## 2023-11-09 DIAGNOSIS — Z79899 Other long term (current) drug therapy: Secondary | ICD-10-CM | POA: Diagnosis not present

## 2023-11-09 DIAGNOSIS — R7989 Other specified abnormal findings of blood chemistry: Secondary | ICD-10-CM | POA: Diagnosis not present

## 2023-11-17 DIAGNOSIS — E1169 Type 2 diabetes mellitus with other specified complication: Secondary | ICD-10-CM | POA: Diagnosis not present

## 2023-11-17 DIAGNOSIS — I2584 Coronary atherosclerosis due to calcified coronary lesion: Secondary | ICD-10-CM | POA: Diagnosis not present

## 2023-11-17 DIAGNOSIS — Z1331 Encounter for screening for depression: Secondary | ICD-10-CM | POA: Diagnosis not present

## 2023-11-17 DIAGNOSIS — F1010 Alcohol abuse, uncomplicated: Secondary | ICD-10-CM | POA: Diagnosis not present

## 2023-11-17 DIAGNOSIS — Z1339 Encounter for screening examination for other mental health and behavioral disorders: Secondary | ICD-10-CM | POA: Diagnosis not present

## 2023-11-17 DIAGNOSIS — I272 Pulmonary hypertension, unspecified: Secondary | ICD-10-CM | POA: Diagnosis not present

## 2023-11-17 DIAGNOSIS — J449 Chronic obstructive pulmonary disease, unspecified: Secondary | ICD-10-CM | POA: Diagnosis not present

## 2023-11-17 DIAGNOSIS — K7460 Unspecified cirrhosis of liver: Secondary | ICD-10-CM | POA: Diagnosis not present

## 2023-11-17 DIAGNOSIS — R82998 Other abnormal findings in urine: Secondary | ICD-10-CM | POA: Diagnosis not present

## 2023-11-17 DIAGNOSIS — Z0000 Encounter for general adult medical examination without abnormal findings: Secondary | ICD-10-CM | POA: Diagnosis not present

## 2023-11-17 DIAGNOSIS — D61818 Other pancytopenia: Secondary | ICD-10-CM | POA: Diagnosis not present

## 2023-11-17 DIAGNOSIS — F331 Major depressive disorder, recurrent, moderate: Secondary | ICD-10-CM | POA: Diagnosis not present

## 2023-11-30 ENCOUNTER — Other Ambulatory Visit: Payer: Self-pay | Admitting: Gastroenterology

## 2023-12-07 MED ORDER — SODIUM CHLORIDE 0.9% IV SOLUTION: Freq: Once | INTRAVENOUS | Status: AC

## 2023-12-14 ENCOUNTER — Other Ambulatory Visit: Payer: Self-pay | Admitting: Gastroenterology

## 2023-12-26 ENCOUNTER — Encounter: Payer: Self-pay | Admitting: Internal Medicine

## 2023-12-26 ENCOUNTER — Other Ambulatory Visit: Payer: Self-pay | Admitting: Internal Medicine

## 2023-12-26 DIAGNOSIS — Z0000 Encounter for general adult medical examination without abnormal findings: Secondary | ICD-10-CM

## 2024-01-09 DIAGNOSIS — R748 Abnormal levels of other serum enzymes: Secondary | ICD-10-CM | POA: Diagnosis not present

## 2024-01-09 DIAGNOSIS — K7030 Alcoholic cirrhosis of liver without ascites: Secondary | ICD-10-CM | POA: Diagnosis not present

## 2024-01-09 DIAGNOSIS — K3189 Other diseases of stomach and duodenum: Secondary | ICD-10-CM | POA: Diagnosis not present

## 2024-01-09 DIAGNOSIS — I8510 Secondary esophageal varices without bleeding: Secondary | ICD-10-CM | POA: Diagnosis not present

## 2024-01-09 DIAGNOSIS — F1010 Alcohol abuse, uncomplicated: Secondary | ICD-10-CM | POA: Diagnosis not present

## 2024-01-09 DIAGNOSIS — K766 Portal hypertension: Secondary | ICD-10-CM | POA: Diagnosis not present

## 2024-01-10 ENCOUNTER — Ambulatory Visit
Admission: RE | Admit: 2024-01-10 | Discharge: 2024-01-10 | Disposition: A | Discharge status: Home / Self Care | Source: Ambulatory Visit | Attending: Internal Medicine | Admitting: Internal Medicine

## 2024-01-10 DIAGNOSIS — Z122 Encounter for screening for malignant neoplasm of respiratory organs: Secondary | ICD-10-CM | POA: Diagnosis not present

## 2024-01-10 DIAGNOSIS — Z0000 Encounter for general adult medical examination without abnormal findings: Secondary | ICD-10-CM

## 2024-01-10 DIAGNOSIS — F17210 Nicotine dependence, cigarettes, uncomplicated: Secondary | ICD-10-CM | POA: Diagnosis not present

## 2024-01-10 NOTE — Progress Notes (Signed)
 Patient with low sodium. Not currently on daily diuretics. Recommend patient go to ED for additional evaluation and management. Please reinforce this is not the same as sodium that we eat and that this needs to be repeated in the hospital. Review with patient that untreated hyponatremia can lead to seizures and coma.   Additionally liver enzymes are 5x ULN. This is likely due to cirrhosis and ongoing alcohol  use.

## 2024-01-11 NOTE — Telephone Encounter (Signed)
 The Pts wife is returning call to speak to nurse

## 2024-01-11 NOTE — Telephone Encounter (Signed)
 Patient with low sodium. Not currently on daily diuretics. Recommend patient go to ED for additional evaluation and management. Please reinforce this is not the same as sodium that we eat and that this needs to be repeated in the hospital. Review with patient that untreated hyponatremia can lead to seizures and coma.    Additionally liver enzymes are 5x ULN. This is likely due to cirrhosis and ongoing alcohol  use.            I was able to reach the patient earlier and review this information (documented in separate note).   I have also spoken to his wife and discussed Dawn's message. She will make sure he goes to [facility] for treatment of hyponatremia. She will also discuss his alcohol  use with him again.

## 2024-01-11 NOTE — Telephone Encounter (Signed)
 I called the patient again and was able to reach him. I provided the information from [place]. States he will go to [facility] for treatment of hyponatremia. He verbalized understanding that his liver enzymes are increased due to cirrhosis and ongoing alcohol  use. He was again informed of the importance of working towards alcohol  cessation.

## 2024-01-11 NOTE — Telephone Encounter (Signed)
 Attempted to call patient twice. No answer and voicemail full. Sent a text via [employer] to his number asking him to call back as soon as possible.  Also, tried to reach his wife twice (approved contact). Left voicemail and sent text asking for a call back as soon as possible.  There are not other contacts listed for this patient. He does not have an active MyChart account.

## 2024-01-12 ENCOUNTER — Other Ambulatory Visit: Payer: Self-pay

## 2024-01-12 ENCOUNTER — Emergency Department (HOSPITAL_COMMUNITY)
Admission: EM | Admit: 2024-01-12 | Discharge: 2024-01-12 | Disposition: A | Discharge status: Home / Self Care | Source: Ambulatory Visit | Attending: Emergency Medicine | Admitting: Emergency Medicine

## 2024-01-12 ENCOUNTER — Encounter (HOSPITAL_COMMUNITY): Payer: Self-pay | Admitting: Emergency Medicine

## 2024-01-12 DIAGNOSIS — Z7982 Long term (current) use of aspirin: Secondary | ICD-10-CM | POA: Diagnosis not present

## 2024-01-12 DIAGNOSIS — F1010 Alcohol abuse, uncomplicated: Secondary | ICD-10-CM | POA: Insufficient documentation

## 2024-01-12 DIAGNOSIS — E512 Wernicke's encephalopathy: Secondary | ICD-10-CM | POA: Diagnosis not present

## 2024-01-12 DIAGNOSIS — Z79899 Other long term (current) drug therapy: Secondary | ICD-10-CM | POA: Insufficient documentation

## 2024-01-12 DIAGNOSIS — K709 Alcoholic liver disease, unspecified: Secondary | ICD-10-CM | POA: Insufficient documentation

## 2024-01-12 DIAGNOSIS — E871 Hypo-osmolality and hyponatremia: Secondary | ICD-10-CM | POA: Diagnosis not present

## 2024-01-12 DIAGNOSIS — E119 Type 2 diabetes mellitus without complications: Secondary | ICD-10-CM | POA: Insufficient documentation

## 2024-01-12 DIAGNOSIS — K551 Chronic vascular disorders of intestine: Secondary | ICD-10-CM | POA: Insufficient documentation

## 2024-01-12 DIAGNOSIS — D696 Thrombocytopenia, unspecified: Secondary | ICD-10-CM | POA: Insufficient documentation

## 2024-01-12 DIAGNOSIS — F17210 Nicotine dependence, cigarettes, uncomplicated: Secondary | ICD-10-CM | POA: Insufficient documentation

## 2024-01-12 DIAGNOSIS — J449 Chronic obstructive pulmonary disease, unspecified: Secondary | ICD-10-CM | POA: Diagnosis not present

## 2024-01-12 DIAGNOSIS — K7030 Alcoholic cirrhosis of liver without ascites: Secondary | ICD-10-CM | POA: Diagnosis not present

## 2024-01-12 LAB — COMPREHENSIVE METABOLIC PANEL WITH GFR
ALT: 138 U/L — ABNORMAL HIGH (ref 0–44)
AST: 205 U/L — ABNORMAL HIGH (ref 15–41)
Albumin: 4.1 g/dL (ref 3.5–5.0)
Alkaline Phosphatase: 75 U/L (ref 38–126)
Anion gap: 10 (ref 5–15)
BUN: 8 mg/dL (ref 8–23)
CO2: 27 mmol/L (ref 22–32)
Calcium: 9.6 mg/dL (ref 8.9–10.3)
Chloride: 91 mmol/L — ABNORMAL LOW (ref 98–111)
Creatinine, Ser: 0.51 mg/dL — ABNORMAL LOW (ref 0.61–1.24)
GFR, Estimated: >60 mL/min (ref >60)
Glucose, Bld: 113 mg/dL — ABNORMAL HIGH (ref 70–99)
Potassium: 4.3 mmol/L (ref 3.5–5.1)
Sodium: 128 mmol/L — ABNORMAL LOW (ref 135–145)
Total Bilirubin: 0.8 mg/dL (ref 0.0–1.2)
Total Protein: 7.1 g/dL (ref 6.5–8.1)

## 2024-01-12 LAB — CBC WITH DIFFERENTIAL/PLATELET
Basophils Absolute: 0 K/uL (ref 0.0–0.1)
Basophils Relative: 1 %
Eosinophils Absolute: 0 K/uL (ref 0.0–0.5)
Eosinophils Relative: 0 %
HCT: 32.3 % — ABNORMAL LOW (ref 39.0–52.0)
Hemoglobin: 11.5 g/dL — ABNORMAL LOW (ref 13.0–17.0)
Lymphocytes Relative: 17 %
Lymphs Abs: 0.2 K/uL — ABNORMAL LOW (ref 0.7–4.0)
MCH: 36.3 pg — ABNORMAL HIGH (ref 26.0–34.0)
MCHC: 35.6 g/dL (ref 30.0–36.0)
MCV: 101.9 fL — ABNORMAL HIGH (ref 80.0–100.0)
Monocytes Absolute: 0.1 K/uL (ref 0.1–1.0)
Monocytes Relative: 6 %
Neutro Abs: 1.1 K/uL — ABNORMAL LOW (ref 1.7–7.7)
Neutrophils Relative %: 76 %
Platelets: 45 K/uL — ABNORMAL LOW (ref 150–400)
RBC: 3.17 MIL/uL — ABNORMAL LOW (ref 4.22–5.81)
RDW: 11.9 % (ref 11.5–15.5)
WBC: 1.4 K/uL — CL (ref 4.0–10.5)
nRBC: 0 % (ref 0.0–0.2)

## 2024-01-12 LAB — URINALYSIS, ROUTINE W REFLEX MICROSCOPIC
Bilirubin Urine: NEGATIVE
Glucose, UA: NEGATIVE mg/dL
Hgb urine dipstick: NEGATIVE
Ketones, ur: NEGATIVE mg/dL
Leukocytes,Ua: NEGATIVE
Nitrite: NEGATIVE
Protein, ur: NEGATIVE mg/dL
Specific Gravity, Urine: 1.008 (ref 1.005–1.030)
pH: 6 (ref 5.0–8.0)

## 2024-01-12 LAB — MAGNESIUM: Magnesium: 1.3 mg/dL — ABNORMAL LOW (ref 1.7–2.4)

## 2024-01-12 LAB — LIPASE, BLOOD: Lipase: 17 U/L (ref 11–51)

## 2024-01-12 LAB — ETHANOL: Alcohol, Ethyl (B): 26 mg/dL — ABNORMAL HIGH (ref <15)

## 2024-01-12 LAB — OSMOLALITY: Osmolality: 283 mosm/kg (ref 275–295)

## 2024-01-12 MED ORDER — MAGNESIUM SULFATE 2 GM/50ML IV SOLN
2.0000 g | Freq: Once | INTRAVENOUS | Status: AC
  Administered 2024-01-12: 2 g via INTRAVENOUS
  Filled 2024-01-12: qty 50

## 2024-01-12 MED ORDER — SODIUM CHLORIDE 0.9 % IV BOLUS
1000.0000 mL | Freq: Once | INTRAVENOUS | Status: AC
  Administered 2024-01-12: 1000 mL via INTRAVENOUS

## 2024-01-12 MED ORDER — SODIUM CHLORIDE 1 G PO TABS: 1.0000 g | ORAL_TABLET | Freq: Every day | ORAL | 0 refills | Status: AC

## 2024-01-12 MED ORDER — MULTI-VITAMIN/MINERALS PO TABS: 1.0000 | ORAL_TABLET | Freq: Every day | ORAL | 0 refills | Status: AC

## 2024-01-12 NOTE — ED Triage Notes (Signed)
 Pt sent from pcp for hyponatremia. Pt is a daily drinker. Pts last drink was 0600 today.

## 2024-01-12 NOTE — ED Provider Notes (Signed)
 [place] EMERGENCY DEPARTMENT AT [facility] Provider Note  CSN: [id number] Arrival date & time: 01/12/24 0710  Chief Complaint(s) hyponatremia  HPI [name] is a 68 y.o. male with past medical history as below, significant for History of EtOH cirrhosis (follows with [facility] liver care and transplant), ongoing alcohol  abuse, COPD, chronic mesenteric ischemia, Warnicke's encephalopathy. He was sent by outpatient primary care office secondary to hyponatremia at 125 and elevated liver enzymes  Prior sodium reviewed, sodium was 127 9/24, sodium was 129 8/21.  Liver enzymes also chronically elevated, seem to be worsening likely secondary to ongoing alcohol  abuse and cirrhosis  Patient with ongoing daily alcohol  use, last alcohol  use was this morning.  Typically drinks between 13 to 15 x12 ounce beers daily.  Reports he had 1 breakfast beer this morning prior to arrival.  Denies any vomiting or nausea or diarrhea.  No significant pain to his abdomen or chest, no dyspnea, no change to regular diet or medications.  No change to his typical p.o. intake.  No recent supplement use that is typical, no travel or sick contacts.  No diuretic.  No fever or jaundice  Patient reports that he feels fairly normal, he is eager for quick work up so he can go home and drink more etoh per pt   Past Medical History Past Medical History:  Diagnosis Date   Alcohol  use disorder, severe, dependence (HCC) 02/16/2015   Anemia    Anxiety    Arthritis 08/16/2012   generalized arthritis   Cirrhosis (HCC)    Cirrhosis of liver (HCC)    Clotting disorder    Diabetes mellitus without complication (HCC)    Foreign body 08/16/2012   noseBB pellet since age 23. remains.   GERD (gastroesophageal reflux disease)    History of ETOH abuse 08/16/2012   Heavy alcohol  use daily   Pancreatitis 08/16/2012   at present weakness,fatiques easily   Platelets decreased    Was told by Dr.  Tolbert a little low-not sure how low per spouse -Lillian   Substance abuse [facility])    Tobacco abuse 12/10/2019   Transfusion history 08/16/2012   '99   Wernicke encephalopathy    Patient Active Problem List   Diagnosis Date Noted   Tobacco abuse 12/10/2019   RBBB 12/10/2019   Abnormal TSH 12/04/2016   Eczema 10/21/2016   Mixed emotional features as adjustment reaction 02/18/2016   Screening for malignant neoplasm of prostate 09/30/2015   Gynecomastia 09/30/2015   Cervical post-laminectomy syndrome 09/11/2015   Spondylolisthesis of cervical region 09/11/2015   Lesion of liver 07/02/2015   Altered mental state 03/28/2015   Acute encephalopathy 03/28/2015   Delirium 03/28/2015   Alcohol  use disorder, severe, dependence (HCC) 02/16/2015   Alcohol  use with intoxication delirium 02/16/2015   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/16/2015   Encephalopathy 02/12/2015   Hyponatremia 02/12/2015   Alcoholic cirrhosis of liver with ascites (HCC)    Acute blood loss anemia    Upper GI bleed    Thrombocytopenia 10/10/2014   GI bleed 10/10/2014   Cirrhosis of liver (HCC) 10/10/2014   Pulmonary edema 10/10/2014   Pancytopenia (HCC) 10/10/2014   Cellulitis 07/29/2014   Scrotal swelling 07/29/2014   Deficiency anemia 04/12/2008   Alcohol  abuse 04/12/2008   GERD 04/12/2008   PANCREATITIS 04/12/2008   Blood in stool 04/12/2008   Other malaise and fatigue 04/12/2008   Hemoptysis 04/12/2008   NAUSEA AND VOMITING 04/12/2008   EPIGASTRIC PAIN 04/12/2008  ALCOHOLIC HEPATITIS, HX OF 04/12/2008   Home Medication(s) Prior to Admission medications   Medication Sig Start Date End Date Taking? Authorizing Provider  Multiple Vitamins-Minerals (MULTIVITAMIN WITH MINERALS) tablet Take 1 tablet by mouth daily. 01/12/24  Yes Elnor Savant A, DO  sodium chloride  1 g tablet Take 1 tablet (1 g total) by mouth daily for 5 days. 01/12/24 01/17/24 Yes Elnor Savant A, DO   aspirin  81 MG EC tablet Take by mouth.    [provider]  busPIRone (BUSPAR) 7.5 MG tablet Take 7.5 mg by mouth 2 (two) times daily.    [provider]  Cobalamin Combinations (B12 FOLATE) 800-800 MCG CAPS as directed Orally    [provider]  Cyanocobalamin  (VITAMIN B12 PO) Take 1 tablet by mouth daily.    [provider]  FOLIC ACID  PO Take 1 tablet by mouth daily.    [provider]  magnesium  oxide (MAG-OX) 400 MG tablet Take 2 tablets by mouth 2 (two) times daily. 11/29/19   [provider]  sertraline (ZOLOFT) 100 MG tablet Take 100 mg by mouth daily. 11/29/22   [provider]  sildenafil (VIAGRA) 50 MG tablet 1 tablet as needed Orally Once a day for 30 days 06/30/22   [provider]  thiamine  (VITAMIN B1) 100 MG tablet Take 100 mg by mouth daily. 07/14/15   [provider]  Thiamine  HCl (B-1 PO) Take by mouth daily.    [provider]  traZODone  (DESYREL ) 100 MG tablet Take 100 mg by mouth at bedtime.    [provider]                                                                                                                                    Past Surgical History Past Surgical History:  Procedure Laterality Date   COLONOSCOPY     ESOPHAGOGASTRODUODENOSCOPY     ESOPHAGOGASTRODUODENOSCOPY N/A 08/15/2019   Procedure: ESOPHAGOGASTRODUODENOSCOPY (EGD);  Surgeon: Burnette Fallow, MD;  Location: [facility];  Service: Endoscopy;  Laterality: N/A;  Pt will need 1 bag of pheresed platelets pre-procedure   ESOPHAGOGASTRODUODENOSCOPY (EGD) WITH PROPOFOL  N/A 02/11/2016   Procedure: ESOPHAGOGASTRODUODENOSCOPY (EGD) WITH PROPOFOL ;  Surgeon: Fallow Burnette, MD;  Location: [facility];  Service: Endoscopy;  Laterality: N/A;   EUS N/A 08/18/2012   Procedure: UPPER ENDOSCOPIC ULTRASOUND (EUS) LINEAR;  Surgeon: Belvie JONETTA Just, MD;  Location: [facility];  Service: Endoscopy;  Laterality: N/A;    INSERTION OF MESH N/A 06/18/2015   Procedure: INSERTION OF MESH;  Surgeon: Oneil Budge, MD;  Location: [facility];  Service: General;  Laterality: N/A;   NECK SURGERY     fusion of neck-titanium hardware inplaced   NECK SURGERY  [id number]   UMBILICAL HERNIA REPAIR N/A 06/18/2015   Procedure: HERNIA REPAIR UMBILICAL ADULT WITH MESH;  Surgeon: Oneil Budge, MD;  Location: [facility];  Service: General;  Laterality: N/A;   Family History  Family History  Problem Relation Age of Onset   Hypertension Father    Cancer Mother 70       lung   Stroke Mother    Other Neg Hx        gynecomastia    Social History Social History   Tobacco Use   Smoking status: Every Day    Current packs/day: 1.00    Average packs/day: 1 pack/day for 40.0 years (40.0 ttl pk-yrs)    Types: Cigarettes   Smokeless tobacco: Never   Tobacco comments:    Quit, now 6-7 cigs per day.  Vaping Use   Vaping status: Never Used  Substance Use Topics   Alcohol  use: Yes    Alcohol /week: 15.0 standard drinks of alcohol     Types: 15 Cans of beer per week    Comment: Drinks 15 cans of beer daily   Drug use: Yes    Types: Marijuana    Comment: 03/2015 quit 20 yrs ago   Allergies Patient has no known allergies.  Review of Systems A thorough review of systems was obtained and all systems are negative except as noted in the HPI and PMH.   Physical Exam Vital Signs  I have reviewed the triage vital signs BP (!) 165/99   Pulse 71   Temp 98.1 F (36.7 C) (Oral)   Resp 17   Ht 5' 7 (1.702 m)   Wt 62.6 kg   SpO2 96%   BMI 21.61 kg/m  Physical Exam Vitals and nursing note reviewed.  Constitutional:      General: He is not in acute distress.    Appearance: Normal appearance. He is well-developed. He is not ill-appearing.  HENT:     Head: Normocephalic and atraumatic.     Right Ear: External ear normal.     Left Ear: External ear normal.     Nose: Nose normal.     Mouth/Throat:     Mouth: Mucous membranes are moist.   Eyes:     General: No scleral icterus.       Right eye: No discharge.        Left eye: No discharge.  Cardiovascular:     Rate and Rhythm: Normal rate.  Pulmonary:     Effort: Pulmonary effort is normal. No respiratory distress.     Breath sounds: No stridor.  Abdominal:     General: Abdomen is flat. There is no distension.     Palpations: Abdomen is soft.     Tenderness: There is no abdominal tenderness. There is no guarding.  Musculoskeletal:        General: No deformity.     Cervical back: No rigidity.  Skin:    General: Skin is warm and dry.     Coloration: Skin is not cyanotic, jaundiced or pale.  Neurological:     Mental Status: He is alert and oriented to person, place, and time.     Comments: No asterixis  Psychiatric:        Speech: Speech normal.        Behavior: Behavior normal. Behavior is cooperative.     ED Results and Treatments Labs (all labs ordered are listed, but only abnormal results are displayed) Labs Reviewed  CBC WITH DIFFERENTIAL/PLATELET - Abnormal; Notable for the following components:      Result Value   WBC 1.4 (*)    RBC 3.17 (*)    Hemoglobin 11.5 (*)    HCT 32.3 (*)    MCV 101.9 (*)  MCH 36.3 (*)    Platelets 45 (*)    Neutro Abs 1.1 (*)    Lymphs Abs 0.2 (*)    All other components within normal limits  COMPREHENSIVE METABOLIC PANEL WITH GFR - Abnormal; Notable for the following components:   Sodium 128 (*)    Chloride 91 (*)    Glucose, Bld 113 (*)    Creatinine, Ser 0.51 (*)    AST 205 (*)    ALT 138 (*)    All other components within normal limits  ETHANOL - Abnormal; Notable for the following components:   Alcohol , Ethyl (B) 26 (*)    All other components within normal limits  MAGNESIUM  - Abnormal; Notable for the following components:   Magnesium  1.3 (*)    All other components within normal limits  LIPASE, BLOOD  URINALYSIS, ROUTINE W REFLEX MICROSCOPIC  OSMOLALITY                                                                                                                           Radiology No results found.  Pertinent labs & imaging results that were available during my care of the patient were reviewed by me and considered in my medical decision making (see MDM for details).  Medications Ordered in ED Medications  sodium chloride  0.9 % bolus 1,000 mL (0 mLs Intravenous Stopped 01/12/24 1039)  magnesium  sulfate IVPB 2 g 50 mL (0 g Intravenous Stopped 01/12/24 1039)                                                                                                                                     Procedures Procedures  (including critical care time)  Medical Decision Making / ED Course    Medical Decision Making:    [name] is a 68 y.o. male with past medical history as below, significant for History of EtOH cirrhosis (follows with [facility] liver care and transplant), ongoing alcohol  abuse, COPD, chronic mesenteric ischemia, Warnicke's encephalopathy. He was sent by outpatient primary care office secondary to hyponatremia at 125 and elevated liver enzymes. The complaint involves an extensive differential diagnosis and also carries with it a high risk of complications and morbidity.  Serious etiology was considered. Ddx includes but is not limited to: [facility] mania, toxic alcohol  ingestion, volume loss, dehydration, dietary abnormalities, etc.  Complete initial physical exam performed, notably the patient  was in no distress, sitting comfortably.    Reviewed and confirmed nursing documentation for past medical history, family history, social history.  Vital signs reviewed.    Chronic alcohol  use - Chronic hyponatremia, chronic elevated liver enzymes, chronic thrombocytopenia, leukopenia likely secondary to his chronic daily alcohol  abuse - He has no other associated symptoms, no vomiting or nausea, abdominal pain fevers, jaundice.  He is tolerant p.o. without difficulty. - I had  a frank discussion with the patient regarding his chronic alcohol  use and liver damage.  He will consider abstinence but spouse reports that she has been attempting to get him to quit for the past 30 years without success - Start patient multivitamin, advised to increase his salt intake.  Follow-up PCP for recheck of labs in the next few days.    Clinical Course as of 01/12/24 1050  Thu Jan 12, 2024  0840 Sodium(!): 128 Similar to prior, chronic hyponatremia.  Likely beer Poto mania [SG]  0841 AST(!): 205 [SG]  0841 ALT(!): 138 Consistent with chronic alcohol  abuse.  No rapid quadrant Donnell pain, no jaundice or fever.  Bili is not elevated [SG]  1017 Platelets(!): 45 Similar to prior, likely secondary to chronic alcohol  abuse.  No evidence of bleeding [SG]  1018 WBC(!!): 1.4 Similar to prior, again likely secondary to chronic alcohol  abuse [SG]    Clinical Course User Index [SG] Elnor Jayson LABOR, DO     10:50 AM:  I have discussed the diagnosis/risks/treatment options with the patient and family.  Evaluation and diagnostic testing in the emergency department does not suggest an emergent condition requiring admission or immediate intervention beyond what has been performed at this time.  They will follow up with PCP. We also discussed returning to the ED immediately if new or worsening sx occur. We discussed the sx which are most concerning (e.g., sudden worsening pain, fever, inability to tolerate by mouth, yellowing of the skin, fever, concern for withdrawal) that necessitate immediate return.    The patient appears reasonably screened and/or stabilized for discharge and I doubt any other medical condition or other [facility] requiring further screening, evaluation, or treatment in the ED at this time prior to discharge.                 Additional history obtained: -Additional history obtained from family -External records from outside source obtained and reviewed including:  Chart review including previous notes, labs, imaging, consultation notes including  Prior labs, primary care documentation   Lab Tests: -I ordered, reviewed, and interpreted labs.   The pertinent results include:   Labs Reviewed  CBC WITH DIFFERENTIAL/PLATELET - Abnormal; Notable for the following components:      Result Value   WBC 1.4 (*)    RBC 3.17 (*)    Hemoglobin 11.5 (*)    HCT 32.3 (*)    MCV 101.9 (*)    MCH 36.3 (*)    Platelets 45 (*)    Neutro Abs 1.1 (*)    Lymphs Abs 0.2 (*)    All other components within normal limits  COMPREHENSIVE METABOLIC PANEL WITH GFR - Abnormal; Notable for the following components:   Sodium 128 (*)    Chloride 91 (*)    Glucose, Bld 113 (*)    Creatinine, Ser 0.51 (*)    AST 205 (*)    ALT 138 (*)    All other components within normal limits  ETHANOL - Abnormal; Notable for the following components:   Alcohol , Ethyl (  B) 26 (*)    All other components within normal limits  MAGNESIUM  - Abnormal; Notable for the following components:   Magnesium  1.3 (*)    All other components within normal limits  LIPASE, BLOOD  URINALYSIS, ROUTINE W REFLEX MICROSCOPIC  OSMOLALITY    Notable for as above  EKG   EKG Interpretation Date/Time:  Thursday January 12 2024 07:24:26 EDT Ventricular Rate:  81 PR Interval:  184 QRS Duration:  131 QT Interval:  392 QTC Calculation: 455 R Axis:   2  Text Interpretation: Sinus rhythm Atrial premature complexes Right bundle branch block Interpretation limited secondary to artifact similar to prior Confirmed by Elnor Savant ([id number]) on 01/12/2024 7:38:31 AM         Imaging Studies ordered: na   Medicines ordered and prescription drug management: Meds ordered this encounter  Medications   sodium chloride  0.9 % bolus 1,000 mL   magnesium  sulfate IVPB 2 g 50 mL   Multiple Vitamins-Minerals (MULTIVITAMIN WITH MINERALS) tablet    Sig: Take 1 tablet by mouth daily.    Dispense:  30 tablet    Refill:   0   sodium chloride  1 g tablet    Sig: Take 1 tablet (1 g total) by mouth daily for 5 days.    Dispense:  5 tablet    Refill:  0    -I have reviewed the patients home medicines and have made adjustments as needed   Consultations Obtained: na   Cardiac Monitoring: Continuous pulse oximetry interpreted by myself, 98% on RA.    Social Determinants of Health:  Diagnosis or treatment significantly limited by social determinants of health: current smoker and alcohol  use Counseled patient for approximately  minutes regarding smoking cessation. Discussed risks of smoking and how they applied and affected their visit here today. Patient not ready to quit at this time, however will follow up with their primary doctor when they are.   CPT code: 00593: intermediate counseling for smoking cessation  Alcohol  cessation counseling also provided    Reevaluation: After the interventions noted above, I reevaluated the patient and found that they have stayed the same  Co morbidities that complicate the patient evaluation  Past Medical History:  Diagnosis Date   Alcohol  use disorder, severe, dependence (HCC) 02/16/2015   Anemia    Anxiety    Arthritis 08/16/2012   generalized arthritis   Cirrhosis (HCC)    Cirrhosis of liver (HCC)    Clotting disorder    Diabetes mellitus without complication (HCC)    Foreign body 08/16/2012   noseBB pellet since age 34. remains.   GERD (gastroesophageal reflux disease)    History of ETOH abuse 08/16/2012   Heavy alcohol  use daily   Pancreatitis 08/16/2012   at present weakness,fatiques easily   Platelets decreased    Was told by Dr. Tolbert a little low-not sure how low per spouse -Lillian   Substance abuse [facility])    Tobacco abuse 12/10/2019   Transfusion history 08/16/2012   '99   Wernicke encephalopathy       Dispostion: Disposition decision including need for hospitalization was considered, and patient discharged from emergency  department.    Final Clinical Impression(s) / ED Diagnoses Final diagnoses:  Chronic hyponatremia  Chronic alcohol  abuse  Alcohol -associated liver disease  Hypomagnesemia  Thrombocytopenia        Elnor Savant LABOR, DO 01/12/24 1051

## 2024-01-12 NOTE — Discharge Instructions (Addendum)
 It was a pleasure caring for you today in the emergency department.  Your sodium is chronically low, this is likely secondary to your chronic alcohol  abuse.  Please increase your daily salt intake and try to cut back on your alcohol  use.  The alcohol  is damaging your liver. The more alcohol  you drink the more damage your liver will sustain and you can eventually develop liver failure which is fatal.  Recommend you cut back on your alcohol  use.  Please do not stop drinking cold turkey as this can precipitate alcohol  withdrawal, please gradually reduce your alcohol  consumption with a goal of complete abstinence. Please follow up with your PCP or with outpatient detox resources outlined on handout attached to your paperwork from today.  Please follow-up with your primary care medical team in the next 5 days for recheck of your labs  Please return to the emergency department for any worsening or worrisome symptoms.

## 2024-01-17 DIAGNOSIS — D61818 Other pancytopenia: Secondary | ICD-10-CM | POA: Diagnosis not present

## 2024-01-17 DIAGNOSIS — K7460 Unspecified cirrhosis of liver: Secondary | ICD-10-CM | POA: Diagnosis not present

## 2024-01-17 DIAGNOSIS — Z23 Encounter for immunization: Secondary | ICD-10-CM | POA: Diagnosis not present

## 2024-01-17 DIAGNOSIS — F1010 Alcohol abuse, uncomplicated: Secondary | ICD-10-CM | POA: Diagnosis not present

## 2024-01-17 DIAGNOSIS — E871 Hypo-osmolality and hyponatremia: Secondary | ICD-10-CM | POA: Diagnosis not present

## 2024-01-17 DIAGNOSIS — I272 Pulmonary hypertension, unspecified: Secondary | ICD-10-CM | POA: Diagnosis not present

## 2024-01-17 DIAGNOSIS — I8500 Esophageal varices without bleeding: Secondary | ICD-10-CM | POA: Diagnosis not present

## 2024-01-17 DIAGNOSIS — E1169 Type 2 diabetes mellitus with other specified complication: Secondary | ICD-10-CM | POA: Diagnosis not present

## 2024-01-25 ENCOUNTER — Encounter (HOSPITAL_COMMUNITY): Payer: Self-pay | Admitting: Gastroenterology

## 2024-01-25 NOTE — Progress Notes (Signed)
 Attempted to obtain medical history via telephone, unable to reach at this time. Unable to leave voicemail to return pre surgical testing department's phone call,due to mailbox full on both listed numbers.

## 2024-01-31 NOTE — Anesthesia Preprocedure Evaluation (Signed)
 Anesthesia Evaluation  Patient identified by MRN, date of birth, ID band Patient awake    Reviewed: Allergy & Precautions, NPO status , Patient's Chart, lab work & pertinent test results  History of Anesthesia Complications Negative for: history of anesthetic complications  Airway Mallampati: II  TM Distance: >3 FB Neck ROM: Full    Dental no notable dental hx. (+) Edentulous Upper, Edentulous Lower   Pulmonary COPD, Current SmokerPatient did not abstain from smoking.    + wheezing      Cardiovascular (-) hypertension(-) angina (-) Past MI Normal cardiovascular exam+ dysrhythmias  Rhythm:Regular Rate:Normal     Neuro/Psych  PSYCHIATRIC DISORDERS Anxiety     Wernickes encephalopathy    GI/Hepatic ,,,(+) Cirrhosis     substance abuse  alcohol  use  Endo/Other  diabetes    Renal/GU Lab Results      Component                Value               Date                      NA                       128 (L)             01/12/2024                CL                       91 (L)              01/12/2024                K                        4.3                 01/12/2024                CO2                      27                  01/12/2024                BUN                      8                   01/12/2024                CREATININE               0.51 (L)            01/12/2024                GFRNONAA                 >60                 01/12/2024               \             Musculoskeletal  (+) Arthritis ,    Abdominal   Peds  Hematology  (+)  Blood dyscrasia   Anesthesia Other Findings   Reproductive/Obstetrics                              Anesthesia Physical Anesthesia Plan  ASA: 3  Anesthesia Plan: MAC   Post-op Pain Management:    Induction: Intravenous  PONV Risk Score and Plan: Treatment may vary due to age or medical condition and Propofol  infusion  Airway Management Planned:  Natural Airway and Nasal Cannula  Additional Equipment: None  Intra-op Plan:   Post-operative Plan:   Informed Consent: I have reviewed the patients History and Physical, chart, labs and discussed the procedure including the risks, benefits and alternatives for the proposed anesthesia with the patient or authorized representative who has indicated his/her understanding and acceptance.     Dental advisory given  Plan Discussed with: CRNA and Surgeon  Anesthesia Plan Comments: (EGD for cirrhosis)         Anesthesia Quick Evaluation

## 2024-02-01 ENCOUNTER — Encounter (HOSPITAL_COMMUNITY)
Admission: RE | Disposition: A | Discharge status: Home / Self Care | Payer: Self-pay | Source: Home / Self Care | Attending: Gastroenterology

## 2024-02-01 ENCOUNTER — Encounter (HOSPITAL_COMMUNITY): Payer: Self-pay | Admitting: Gastroenterology

## 2024-02-01 ENCOUNTER — Other Ambulatory Visit: Payer: Self-pay

## 2024-02-01 ENCOUNTER — Ambulatory Visit (HOSPITAL_COMMUNITY): Payer: Self-pay | Admitting: Anesthesiology

## 2024-02-01 ENCOUNTER — Ambulatory Visit (HOSPITAL_COMMUNITY)
Admission: RE | Admit: 2024-02-01 | Discharge: 2024-02-01 | Disposition: A | Discharge status: Home / Self Care | Attending: Gastroenterology | Admitting: Gastroenterology

## 2024-02-01 ENCOUNTER — Ambulatory Visit (HOSPITAL_BASED_OUTPATIENT_CLINIC_OR_DEPARTMENT_OTHER): Payer: Self-pay | Admitting: Anesthesiology

## 2024-02-01 DIAGNOSIS — K7460 Unspecified cirrhosis of liver: Secondary | ICD-10-CM | POA: Diagnosis not present

## 2024-02-01 DIAGNOSIS — K3189 Other diseases of stomach and duodenum: Secondary | ICD-10-CM | POA: Insufficient documentation

## 2024-02-01 DIAGNOSIS — J449 Chronic obstructive pulmonary disease, unspecified: Secondary | ICD-10-CM | POA: Insufficient documentation

## 2024-02-01 DIAGNOSIS — F17200 Nicotine dependence, unspecified, uncomplicated: Secondary | ICD-10-CM | POA: Diagnosis not present

## 2024-02-01 DIAGNOSIS — E119 Type 2 diabetes mellitus without complications: Secondary | ICD-10-CM | POA: Diagnosis not present

## 2024-02-01 DIAGNOSIS — F419 Anxiety disorder, unspecified: Secondary | ICD-10-CM | POA: Insufficient documentation

## 2024-02-01 DIAGNOSIS — F17210 Nicotine dependence, cigarettes, uncomplicated: Secondary | ICD-10-CM | POA: Diagnosis not present

## 2024-02-01 DIAGNOSIS — Z13810 Encounter for screening for upper gastrointestinal disorder: Secondary | ICD-10-CM | POA: Insufficient documentation

## 2024-02-01 DIAGNOSIS — I8510 Secondary esophageal varices without bleeding: Secondary | ICD-10-CM | POA: Diagnosis not present

## 2024-02-01 DIAGNOSIS — I8500 Esophageal varices without bleeding: Secondary | ICD-10-CM | POA: Diagnosis not present

## 2024-02-01 DIAGNOSIS — F1020 Alcohol dependence, uncomplicated: Secondary | ICD-10-CM | POA: Diagnosis not present

## 2024-02-01 DIAGNOSIS — K766 Portal hypertension: Secondary | ICD-10-CM | POA: Insufficient documentation

## 2024-02-01 DIAGNOSIS — K7030 Alcoholic cirrhosis of liver without ascites: Secondary | ICD-10-CM | POA: Insufficient documentation

## 2024-02-01 DIAGNOSIS — D696 Thrombocytopenia, unspecified: Secondary | ICD-10-CM | POA: Diagnosis not present

## 2024-02-01 DIAGNOSIS — E512 Wernicke's encephalopathy: Secondary | ICD-10-CM | POA: Insufficient documentation

## 2024-02-01 HISTORY — PX: ESOPHAGOGASTRODUODENOSCOPY: SHX5428

## 2024-02-01 LAB — COMPREHENSIVE METABOLIC PANEL WITH GFR
ALT: 101 U/L — ABNORMAL HIGH (ref 0–44)
AST: 165 U/L — ABNORMAL HIGH (ref 15–41)
Albumin: 4 g/dL (ref 3.5–5.0)
Alkaline Phosphatase: 81 U/L (ref 38–126)
Anion gap: 10 (ref 5–15)
BUN: 9 mg/dL (ref 8–23)
CO2: 27 mmol/L (ref 22–32)
Calcium: 9.6 mg/dL (ref 8.9–10.3)
Chloride: 90 mmol/L — ABNORMAL LOW (ref 98–111)
Creatinine, Ser: 0.58 mg/dL — ABNORMAL LOW (ref 0.61–1.24)
GFR, Estimated: >60 mL/min (ref >60)
Glucose, Bld: 111 mg/dL — ABNORMAL HIGH (ref 70–99)
Potassium: 4.4 mmol/L (ref 3.5–5.1)
Sodium: 126 mmol/L — ABNORMAL LOW (ref 135–145)
Total Bilirubin: 0.9 mg/dL (ref 0.0–1.2)
Total Protein: 7.5 g/dL (ref 6.5–8.1)

## 2024-02-01 LAB — CBC
HCT: 33.7 % — ABNORMAL LOW (ref 39.0–52.0)
Hemoglobin: 12.1 g/dL — ABNORMAL LOW (ref 13.0–17.0)
MCH: 35.9 pg — ABNORMAL HIGH (ref 26.0–34.0)
MCHC: 35.9 g/dL (ref 30.0–36.0)
MCV: 100 fL (ref 80.0–100.0)
Platelets: 47 K/uL — ABNORMAL LOW (ref 150–400)
RBC: 3.37 MIL/uL — ABNORMAL LOW (ref 4.22–5.81)
RDW: 12.3 % (ref 11.5–15.5)
WBC: 1.6 K/uL — ABNORMAL LOW (ref 4.0–10.5)
nRBC: 0 % (ref 0.0–0.2)

## 2024-02-01 LAB — ABO/RH: ABO/RH(D): A POS

## 2024-02-01 SURGERY — EGD (ESOPHAGOGASTRODUODENOSCOPY)
Anesthesia: Monitor Anesthesia Care

## 2024-02-01 MED ORDER — SODIUM CHLORIDE 0.9% IV SOLUTION: Freq: Once | INTRAVENOUS | Status: DC

## 2024-02-01 MED ORDER — LIDOCAINE 2% (20 MG/ML) 5 ML SYRINGE
INTRAMUSCULAR | Status: DC | PRN
Start: 2024-02-01 — End: 2024-02-01
  Administered 2024-02-01: 80 mg via INTRAVENOUS

## 2024-02-01 MED ORDER — LACTATED RINGERS IV SOLN
INTRAVENOUS | Status: DC | PRN
Start: 2024-02-01 — End: 2024-02-01

## 2024-02-01 MED ORDER — SODIUM CHLORIDE 0.9 % IV SOLN: INTRAVENOUS | Status: DC

## 2024-02-01 MED ORDER — PROPOFOL 10 MG/ML IV BOLUS
INTRAVENOUS | Status: DC | PRN
  Administered 2024-02-01 (×2): 50 mg via INTRAVENOUS

## 2024-02-01 MED ORDER — PROPOFOL 500 MG/50ML IV EMUL
INTRAVENOUS | Status: DC | PRN
  Administered 2024-02-01: 150 ug/kg/min via INTRAVENOUS

## 2024-02-01 NOTE — Anesthesia Postprocedure Evaluation (Signed)
 Anesthesia Post Note  Patient: Education Administrator  Procedure(s) Performed: EGD (ESOPHAGOGASTRODUODENOSCOPY)     Patient location during evaluation: Endoscopy Anesthesia Type: MAC Level of consciousness: awake and alert Pain management: pain level controlled Vital Signs Assessment: post-procedure vital signs reviewed and stable Respiratory status: spontaneous breathing, nonlabored ventilation, respiratory function stable and patient connected to nasal cannula oxygen Cardiovascular status: blood pressure returned to baseline and stable Postop Assessment: no apparent nausea or vomiting Anesthetic complications: no   No notable events documented.  Last Vitals:  Vitals:   02/01/24 1040 02/01/24 1050  BP: (!) 148/76 (!) 171/95  Pulse: 62 65  Resp: 16 16  Temp:  36.8 C  SpO2: 92% 93%    Last Pain:  Vitals:   02/01/24 1050  TempSrc: Temporal  PainSc: 0-No pain                 Garnette DELENA Gab

## 2024-02-01 NOTE — Transfer of Care (Signed)
 Immediate Anesthesia Transfer of Care Note  Patient: Luisfernando Gaede  Procedure(s) Performed: EGD (ESOPHAGOGASTRODUODENOSCOPY)  Patient Location: PACU  Anesthesia Type:MAC  Level of Consciousness: awake and alert   Airway & Oxygen Therapy: Patient Spontanous Breathing and Patient connected to nasal cannula oxygen  Post-op Assessment: Report given to RN and Post -op Vital signs reviewed and stable  Post vital signs: Reviewed and stable  Last Vitals:  Vitals Value Taken Time  BP    Temp    Pulse 60 02/01/24 10:21  Resp 18 02/01/24 10:21  SpO2 95 % 02/01/24 10:21  Vitals shown include unfiled device data.  Last Pain:  Vitals:   02/01/24 0946  TempSrc: Temporal  PainSc: 0-No pain         Complications: No notable events documented.

## 2024-02-01 NOTE — H&P (Signed)
 [facility] H/P Note  Chief Complaint: cirrhosis, variceal screening  HPI: [name] is an 68 y.o. male.  Alcohol -mediated cirrhosis.  Here endoscopy for variceal screening.  Past Medical History:  Diagnosis Date   Alcohol  use disorder, severe, dependence (HCC) 02/16/2015   Anemia    Anxiety    Arthritis 08/16/2012   generalized arthritis   Cirrhosis (HCC)    Cirrhosis of liver (HCC)    Clotting disorder    Diabetes mellitus without complication (HCC)    Foreign body 08/16/2012   noseBB pellet since age 63. remains.   GERD (gastroesophageal reflux disease)    History of ETOH abuse 08/16/2012   Heavy alcohol  use daily   Pancreatitis 08/16/2012   at present weakness,fatiques easily   Platelets decreased    Was told by Dr. Tolbert a little low-not sure how low per spouse -Lillian   Substance abuse [facility])    Tobacco abuse 12/10/2019   Transfusion history 08/16/2012   '99   Wernicke encephalopathy     Past Surgical History:  Procedure Laterality Date   COLONOSCOPY     ESOPHAGOGASTRODUODENOSCOPY     ESOPHAGOGASTRODUODENOSCOPY N/A 08/15/2019   Procedure: ESOPHAGOGASTRODUODENOSCOPY (EGD);  Surgeon: Burnette Fallow, MD;  Location: [facility];  Service: Endoscopy;  Laterality: N/A;  Pt will need 1 bag of pheresed platelets pre-procedure   ESOPHAGOGASTRODUODENOSCOPY (EGD) WITH PROPOFOL  N/A 02/11/2016   Procedure: ESOPHAGOGASTRODUODENOSCOPY (EGD) WITH PROPOFOL ;  Surgeon: Fallow Burnette, MD;  Location: [facility];  Service: Endoscopy;  Laterality: N/A;   EUS N/A 08/18/2012   Procedure: UPPER ENDOSCOPIC ULTRASOUND (EUS) LINEAR;  Surgeon: Belvie JONETTA Just, MD;  Location: [facility];  Service: Endoscopy;  Laterality: N/A;   INSERTION OF MESH N/A 06/18/2015   Procedure: INSERTION OF MESH;  Surgeon: Oneil Budge, MD;  Location: [facility];  Service: General;  Laterality: N/A;   NECK SURGERY     fusion of neck-titanium hardware inplaced   NECK SURGERY  [id number]    UMBILICAL HERNIA REPAIR N/A 06/18/2015   Procedure: HERNIA REPAIR UMBILICAL ADULT WITH MESH;  Surgeon: Oneil Budge, MD;  Location: [facility];  Service: General;  Laterality: N/A;    Medications Prior to Admission  Medication Sig Dispense Refill   busPIRone (BUSPAR) 7.5 MG tablet Take 7.5 mg by mouth 2 (two) times daily.     traZODone  (DESYREL ) 100 MG tablet Take 100 mg by mouth at bedtime.     aspirin  81 MG EC tablet Take by mouth.     Cobalamin Combinations (B12 FOLATE) 800-800 MCG CAPS as directed Orally     Cyanocobalamin  (VITAMIN B12 PO) Take 1 tablet by mouth daily.     FOLIC ACID  PO Take 1 tablet by mouth daily.     magnesium  oxide (MAG-OX) 400 MG tablet Take 2 tablets by mouth 2 (two) times daily.     Multiple Vitamins-Minerals (MULTIVITAMIN WITH MINERALS) tablet Take 1 tablet by mouth daily. 30 tablet 0   sertraline (ZOLOFT) 100 MG tablet Take 100 mg by mouth daily.     sildenafil (VIAGRA) 50 MG tablet 1 tablet as needed Orally Once a day for 30 days     thiamine  (VITAMIN B1) 100 MG tablet Take 100 mg by mouth daily.     Thiamine  HCl (B-1 PO) Take by mouth daily.      Allergies: No Known Allergies  Family History  Problem Relation Age of Onset   Hypertension Father    Cancer Mother 68       lung   Stroke  Mother    Other Neg Hx        gynecomastia    Social History:  reports that he has been smoking cigarettes. He has a 40 pack-year smoking history. He has never used smokeless tobacco. He reports current alcohol  use of about 15.0 standard drinks of alcohol  per week. He reports current drug use. Drug: Marijuana.   ROS: As per HPI, all others negative.   Blood pressure (!) 183/90, pulse 79, temperature 98.4 F (36.9 C), temperature source Temporal, resp. rate 17, height 5' 7 (1.702 m), weight 65.1 kg, SpO2 94%. General appearance: NAD HEENT:  [place]/AT, anicteric SKIN:  Scattered ecchymoses/telangiectasias LUNGS:  No visible distress ABD:  Soft, non-tender NEURO:  No  encephalopathy  Results for orders placed or performed during the hospital encounter of 02/01/24 (from the past 48 hours)  ABO/Rh     Status: None   Collection Time: 02/01/24  8:00 AM  Result Value Ref Range   ABO/RH(D)      A POS Performed at [facility], [address], [place], [place] [address]   Prepare platelet pheresis     Status: None (Preliminary result)   Collection Time: 02/01/24  8:30 AM  Result Value Ref Range   Unit Number [id number]    Blood Component Type PLTP2 PSORALEN TREATED    Unit division 00    Status of Unit ALLOCATED    Transfusion Status OK TO TRANSFUSE    Unit Number [id number]    Blood Component Type PLTP2 PSORALEN TREATED    Unit division 00    Status of Unit ISSUED    Transfusion Status      OK TO TRANSFUSE Performed at [facility], [address] [place], [place] [address]    No results found.  Assessment/Plan   Cirrhosis, alcohol -mediated. Thrombocytopenia, receiving platelets in pre-endoscopy unit Endoscopy for variceal screening. Risks (bleeding, infection, bowel perforation that could require surgery, sedation-related changes in cardiopulmonary systems), benefits (identification and possible treatment of source of symptoms, exclusion of certain causes of symptoms), and alternatives (watchful waiting, radiographic imaging studies, empiric medical treatment) of upper endoscopy (EGD) were explained to patient/family in detail and patient wishes to proceed.   BURNETTE ELSIE HERO 02/01/2024, 9:43 AM

## 2024-02-01 NOTE — Discharge Instructions (Signed)

## 2024-02-01 NOTE — Op Note (Signed)
 [facility] Patient Name: [name] Procedure Date: 02/01/2024 MRN: [id number] Attending MD: Elsie Cree , MD, [id number] Date of Birth: 04/29/1955 CSN: [id number] Age: 68 Admit Type: Outpatient Procedure:                Upper GI endoscopy Indications:              Screening procedure, cirrhosis Providers:                Elsie Cree, MD, Robie Breed, RN,                            Haskel Chris, Technician Referring MD:              Medicines:                Monitored Anesthesia Care, pheresed platelets Complications:            No immediate complications. Estimated Blood Loss:     Estimated blood loss: none. Procedure:                Pre-Anesthesia Assessment:                           - Prior to the procedure, a History and Physical                            was performed, and patient medications and                            allergies were reviewed. The patient's tolerance of                            previous anesthesia was also reviewed. The risks                            and benefits of the procedure and the sedation                            options and risks were discussed with the patient.                            All questions were answered, and informed consent                            was obtained. Prior Anticoagulants: The patient has                            taken no anticoagulant or antiplatelet agents. ASA                            Grade Assessment: III - A patient with severe                            systemic disease. After reviewing the risks and  benefits, the patient was deemed in satisfactory                            condition to undergo the procedure.                           After obtaining informed consent, the endoscope was                            passed under direct vision. Throughout the                            procedure, the patient's blood pressure, pulse, and                             oxygen saturations were monitored continuously. The                            [id number] ([id number]) Olympus endoscope was introduced                            through the mouth, and advanced to the second part                            of duodenum. The upper GI endoscopy was                            accomplished without difficulty. The patient                            tolerated the procedure well. Scope In: Scope Out: Findings:      Grade I varices were found in the lower third of the esophagus. They       were diminutive in size.      The exam of the esophagus was otherwise normal.      Moderate portal hypertensive gastropathy was found in the entire       examined stomach.      The exam of the stomach was otherwise normal.      The duodenal bulb, first portion of the duodenum and second portion of       the duodenum were normal. Impression:               - Grade I esophageal varices.                           - Portal hypertensive gastropathy.                           - Normal duodenal bulb, first portion of the                            duodenum and second portion of the duodenum. Moderate Sedation:      Not Applicable - Patient had care per Anesthesia. Recommendation:           - Discharge patient to home (via wheelchair).                           -  Resume previous diet today.                           - Continue present medications.                           - Return to GI clinic at appointment to be                            scheduled.                           - Consider repeat endoscopy for variceal screening                            in 2-3 years.                           - Return to referring physician as previously                            scheduled. Procedure Code(s):        --- Professional ---                           [phone], Esophagogastroduodenoscopy, flexible,                            transoral; diagnostic, including collection of                             specimen(s) by brushing or washing, when performed                            (separate procedure) Diagnosis Code(s):        --- Professional ---                           I85.00, Esophageal varices without bleeding                           K76.6, Portal hypertension                           K31.89, Other diseases of stomach and duodenum                           Z13.810, Encounter for screening for upper                            gastrointestinal disorder CPT copyright 2022 [employer]. All rights reserved. The codes documented in this report are preliminary and upon coder review may  be revised to meet current compliance requirements. Elsie Cree, MD 02/01/2024 10:25:57 AM This report has been signed electronically. Number of Addenda: 0

## 2024-02-02 ENCOUNTER — Encounter (HOSPITAL_COMMUNITY): Payer: Self-pay | Admitting: Gastroenterology

## 2024-02-02 LAB — BPAM PLATELET PHERESIS
Blood Product Expiration Date: 202511202359
Blood Product Expiration Date: 202511202359
ISSUE DATE / TIME: 202511190901
Unit Type and Rh: 5100
Unit Type and Rh: 7300

## 2024-02-02 LAB — PREPARE PLATELET PHERESIS
Unit division: 0
Unit division: 0

## 2024-02-02 LAB — BPAM FFP
Blood Product Expiration Date: 202511242359
Unit Type and Rh: 6200

## 2024-02-02 LAB — PREPARE FRESH FROZEN PLASMA: Unit division: 0
